# Patient Record
Sex: Female | Born: 1950
Health system: Southern US, Community
[De-identification: ages and names within clinical notes are randomized; demographics above are authoritative.]

## PROBLEM LIST (undated history)

## (undated) DIAGNOSIS — R7303 Prediabetes: Secondary | ICD-10-CM

## (undated) DIAGNOSIS — M1990 Unspecified osteoarthritis, unspecified site: Secondary | ICD-10-CM

## (undated) DIAGNOSIS — J45909 Unspecified asthma, uncomplicated: Secondary | ICD-10-CM

## (undated) DIAGNOSIS — F32A Depression, unspecified: Secondary | ICD-10-CM

## (undated) DIAGNOSIS — T7840XA Allergy, unspecified, initial encounter: Secondary | ICD-10-CM

## (undated) DIAGNOSIS — K5900 Constipation, unspecified: Secondary | ICD-10-CM

## (undated) DIAGNOSIS — G4730 Sleep apnea, unspecified: Secondary | ICD-10-CM

## (undated) DIAGNOSIS — D649 Anemia, unspecified: Secondary | ICD-10-CM

## (undated) DIAGNOSIS — F419 Anxiety disorder, unspecified: Secondary | ICD-10-CM

## (undated) DIAGNOSIS — E785 Hyperlipidemia, unspecified: Secondary | ICD-10-CM

## (undated) DIAGNOSIS — K219 Gastro-esophageal reflux disease without esophagitis: Secondary | ICD-10-CM

## (undated) DIAGNOSIS — I10 Essential (primary) hypertension: Secondary | ICD-10-CM

## (undated) HISTORY — DX: Gastro-esophageal reflux disease without esophagitis: K21.9

## (undated) HISTORY — DX: Hyperlipidemia, unspecified: E78.5

## (undated) HISTORY — DX: Unspecified osteoarthritis, unspecified site: M19.90

## (undated) HISTORY — DX: Unspecified asthma, uncomplicated: J45.909

## (undated) HISTORY — DX: Depression, unspecified: F32.A

## (undated) HISTORY — PX: OTHER SURGICAL HISTORY: SHX169

## (undated) HISTORY — DX: Anxiety disorder, unspecified: F41.9

## (undated) HISTORY — DX: Prediabetes: R73.03

## (undated) HISTORY — DX: Anemia, unspecified: D64.9

## (undated) HISTORY — DX: Sleep apnea, unspecified: G47.30

## (undated) HISTORY — DX: Essential (primary) hypertension: I10

## (undated) HISTORY — PX: EYE SURGERY: SHX253

## (undated) HISTORY — DX: Constipation, unspecified: K59.00

## (undated) HISTORY — DX: Allergy, unspecified, initial encounter: T78.40XA

---

## 1973-06-02 HISTORY — PX: HEMORRHOID SURGERY: SHX153

## 1997-06-02 HISTORY — PX: MENISCUS REPAIR: SHX5179

## 1997-11-24 ENCOUNTER — Ambulatory Visit (HOSPITAL_BASED_OUTPATIENT_CLINIC_OR_DEPARTMENT_OTHER): Admission: RE | Admit: 1997-11-24 | Discharge: 1997-11-24 | Payer: Self-pay | Admitting: Orthopedic Surgery

## 1999-06-03 HISTORY — PX: ABDOMINAL HYSTERECTOMY: SHX81

## 1999-06-26 ENCOUNTER — Other Ambulatory Visit: Admission: RE | Admit: 1999-06-26 | Discharge: 1999-06-26 | Payer: Self-pay | Admitting: Gynecology

## 1999-07-11 ENCOUNTER — Other Ambulatory Visit: Admission: RE | Admit: 1999-07-11 | Discharge: 1999-07-11 | Payer: Self-pay | Admitting: Gynecology

## 1999-07-11 ENCOUNTER — Encounter (INDEPENDENT_AMBULATORY_CARE_PROVIDER_SITE_OTHER): Payer: Self-pay

## 1999-08-05 ENCOUNTER — Inpatient Hospital Stay (HOSPITAL_COMMUNITY): Admission: RE | Admit: 1999-08-05 | Discharge: 1999-08-07 | Payer: Self-pay | Admitting: Gynecology

## 1999-08-05 ENCOUNTER — Encounter (INDEPENDENT_AMBULATORY_CARE_PROVIDER_SITE_OTHER): Payer: Self-pay

## 2000-09-17 ENCOUNTER — Encounter: Payer: Self-pay | Admitting: Gastroenterology

## 2000-09-17 ENCOUNTER — Ambulatory Visit (HOSPITAL_COMMUNITY): Admission: RE | Admit: 2000-09-17 | Discharge: 2000-09-17 | Payer: Self-pay | Admitting: Gastroenterology

## 2000-09-23 ENCOUNTER — Ambulatory Visit (HOSPITAL_COMMUNITY): Admission: RE | Admit: 2000-09-23 | Discharge: 2000-09-23 | Payer: Self-pay | Admitting: Gastroenterology

## 2000-09-23 ENCOUNTER — Encounter: Payer: Self-pay | Admitting: Gastroenterology

## 2000-09-30 ENCOUNTER — Ambulatory Visit (HOSPITAL_COMMUNITY): Admission: RE | Admit: 2000-09-30 | Discharge: 2000-09-30 | Payer: Self-pay | Admitting: Gastroenterology

## 2000-12-09 ENCOUNTER — Ambulatory Visit (HOSPITAL_COMMUNITY): Admission: RE | Admit: 2000-12-09 | Discharge: 2000-12-09 | Payer: Self-pay | Admitting: Gastroenterology

## 2000-12-09 ENCOUNTER — Encounter (INDEPENDENT_AMBULATORY_CARE_PROVIDER_SITE_OTHER): Payer: Self-pay | Admitting: Specialist

## 2001-03-08 ENCOUNTER — Encounter: Payer: Self-pay | Admitting: Gastroenterology

## 2001-03-08 ENCOUNTER — Ambulatory Visit (HOSPITAL_COMMUNITY): Admission: RE | Admit: 2001-03-08 | Discharge: 2001-03-08 | Payer: Self-pay | Admitting: Gastroenterology

## 2002-01-18 ENCOUNTER — Other Ambulatory Visit: Admission: RE | Admit: 2002-01-18 | Discharge: 2002-01-18 | Payer: Self-pay | Admitting: Gynecology

## 2004-07-31 ENCOUNTER — Ambulatory Visit (HOSPITAL_COMMUNITY): Admission: RE | Admit: 2004-07-31 | Discharge: 2004-07-31 | Payer: Self-pay | Admitting: Gastroenterology

## 2005-06-11 ENCOUNTER — Other Ambulatory Visit: Admission: RE | Admit: 2005-06-11 | Discharge: 2005-06-11 | Payer: Self-pay | Admitting: Gynecology

## 2005-07-02 ENCOUNTER — Encounter: Admission: RE | Admit: 2005-07-02 | Discharge: 2005-07-02 | Payer: Self-pay | Admitting: Gynecology

## 2006-04-14 ENCOUNTER — Encounter: Admission: RE | Admit: 2006-04-14 | Discharge: 2006-04-14 | Payer: Self-pay | Admitting: Gynecology

## 2008-10-16 ENCOUNTER — Encounter: Admission: RE | Admit: 2008-10-16 | Discharge: 2008-10-16 | Payer: Self-pay | Admitting: Family Medicine

## 2008-11-14 ENCOUNTER — Encounter: Admission: RE | Admit: 2008-11-14 | Discharge: 2008-11-14 | Payer: Self-pay | Admitting: Family Medicine

## 2010-10-18 NOTE — Procedures (Signed)
[place]. [facility]  Patient:    Olivia Burns, Olivia Burns [facility]                     MRN: [id number] Proc. Date: 12/09/00 Adm. Date:  [id number] Disc. Date: [id number] Attending:  Charna Elizabeth CC:         [name] Cove, M.D.   Procedure Report  DATE OF BIRTH:  Mar 15, 1951  REFERRING PHYSICIAN:  [name] Cove, M.D.  PROCEDURE PERFORMED:  Colonoscopy with snare polypectomy x 1 and cold biopsies x 2.  ENDOSCOPIST:  Anselmo Rod, M.D.  INSTRUMENT USED:  Olympus video colonoscope.  INDICATIONS FOR PROCEDURE:  Rectal bleeding in a 59 year old African-American female, rule out colonic polyps, masses, hemorrhoids, etc.  PREPROCEDURE PREPARATION:  Informed consent was procured from the patient. The patient was fasted for eight hours prior to the procedure and prepped with a bottle of magnesium citrate and a gallon of NuLytely the night prior to the procedure.  PREPROCEDURE PHYSICAL:  The patient had stable vital signs.  Neck supple. Chest clear to auscultation.  S1, S2 regular.  Abdomen soft with normal abdominal bowel sounds.  DESCRIPTION OF PROCEDURE:  The patient was placed in the left lateral decubitus position and sedated with 70 mg of Demerol and 7 mg of Versed intravenously.  Once the patient was adequately sedated and maintained on low-flow oxygen and continuous cardiac monitoring, the Olympus video colonoscope was advanced from the rectum to the cecum without difficulty. The patient had a fairly good prep.  A 5 to 6 mm sessile polyp was snared from the midright colon.  The appendicular orifice and ileocecal valve were clearly visualized and photographed.  They appeared normal.  The transverse colon appeared healthy and normal.  There was no evidence of diverticulosis in the left colon.  There was patchy erythema in the midleft colon which was biopsied for pathology.  Small nonbleeding external hemorrhoids were appreciated on anal inspection. There  was no evidence of internal hemorrhoids.  The patient tolerated the procedure well without complications.  IMPRESSION: 1. Small nonbleeding external hemorrhoids. 2. Patchy erythema biopsied from the midleft colon. 3. Small sessile polyp snared from midright colon. 4. No large masses seen. 5. No evidence of diverticulosis.  RECOMMENDATIONS: 1. Await pathology results. 2. Avoid all nonsteroidals. 3. High fiber diet. 4. Outpatient follow-up in the next four weeks.DD:  12/09/00 TD:  12/09/00 Job: [id number]

## 2010-10-18 NOTE — Procedures (Signed)
[place]. [facility]  Patient:    Olivia Burns, Olivia Burns [facility]                     MRN: [id number] Proc. Date: 09/30/00 Adm. Date:  [id number] Attending:  Charna Elizabeth CC:         [name] Cove, M.D.   Procedure Report  DATE OF BIRTH:  June 17, 1950.  PROCEDURE:  Esophagogastroduodenoscopy.  ENDOSCOPIST:  Anselmo Rod, M.D.  INSTRUMENT USED:  Olympus video panendoscope.  INDICATION FOR PROCEDURE:  Epigastric pain in a 60 year old African-American female.  Rule out peptic ulcer disease, esophagitis, gastritis, etc.  Patient has had a normal gallbladder ultrasound and HIDA scan.  She has some cysts on the liver, confirmed by an MRI in the recent past.  Rule out peptic ulcer disease.  PREPROCEDURE PREPARATION:  Informed consent was procured from the patient. The patient was fasted for eight hours prior to the procedure.  PREPROCEDURE PHYSICAL:  VITAL SIGNS:  The patient had stable vital signs.  NECK:  Supple.  CHEST:  Clear to auscultation.  S1, S2 regular.  ABDOMEN:  Soft with epigastric tenderness on palpation, with no guarding, rebound, or rigidity, no hepatosplenomegaly.  DESCRIPTION OF PROCEDURE:  The patient was placed in the left lateral decubitus position and sedated with 50 mg of Demerol and 7 mg of Versed intravenously.  Once the patient was adequately sedate and maintained on low-flow oxygen and continuous cardiac monitoring, the Olympus video panendoscope was advanced through the mouthpiece, over the tongue, into the esophagus under direct vision.  The entire gastric mucosa and the proximal small bowel appeared normal.  The esophagus showed no evidence of erosions, ulcerations, masses, or polyps.  IMPRESSION:  Normal EGD.  RECOMMENDATIONS: 1. Continue PPIs for now. 2. Colonoscopy will be scheduled for her next follow-up in two weeks. 3. Avoid all nonsteroidals, including aspirin, and follow antireflux measures. DD:  09/30/00 TD:   10/01/00 Job: [id number]

## 2011-06-24 ENCOUNTER — Other Ambulatory Visit: Payer: Self-pay | Admitting: Gastroenterology

## 2011-06-24 DIAGNOSIS — R1011 Right upper quadrant pain: Secondary | ICD-10-CM

## 2011-07-14 ENCOUNTER — Encounter (HOSPITAL_COMMUNITY)
Admission: RE | Admit: 2011-07-14 | Discharge: 2011-07-14 | Disposition: A | Discharge status: Home / Self Care | Payer: 59 | Source: Ambulatory Visit | Attending: Gastroenterology | Admitting: Gastroenterology

## 2011-07-14 DIAGNOSIS — K7689 Other specified diseases of liver: Secondary | ICD-10-CM | POA: Insufficient documentation

## 2011-07-14 DIAGNOSIS — R1011 Right upper quadrant pain: Secondary | ICD-10-CM

## 2011-07-14 MED ORDER — SINCALIDE 5 MCG IJ SOLR: Freq: Once | INTRAMUSCULAR | Status: DC

## 2011-07-14 MED ORDER — TECHNETIUM TC 99M MEBROFENIN IV KIT
5.3000 | PACK | Freq: Once | INTRAVENOUS | Status: AC | PRN
  Administered 2011-07-14: 5 via INTRAVENOUS

## 2011-07-16 ENCOUNTER — Other Ambulatory Visit: Payer: Self-pay | Admitting: Family Medicine

## 2011-07-16 ENCOUNTER — Other Ambulatory Visit (HOSPITAL_COMMUNITY)
Admission: RE | Admit: 2011-07-16 | Discharge: 2011-07-16 | Disposition: A | Discharge status: Home / Self Care | Payer: Self-pay | Source: Ambulatory Visit | Attending: Family Medicine | Admitting: Family Medicine

## 2011-07-16 DIAGNOSIS — Z01419 Encounter for gynecological examination (general) (routine) without abnormal findings: Secondary | ICD-10-CM | POA: Insufficient documentation

## 2011-07-16 DIAGNOSIS — Z1159 Encounter for screening for other viral diseases: Secondary | ICD-10-CM | POA: Insufficient documentation

## 2016-04-14 DIAGNOSIS — E089 Diabetes mellitus due to underlying condition without complications: Secondary | ICD-10-CM | POA: Diagnosis not present

## 2016-04-14 DIAGNOSIS — F4321 Adjustment disorder with depressed mood: Secondary | ICD-10-CM | POA: Diagnosis not present

## 2016-04-14 DIAGNOSIS — I10 Essential (primary) hypertension: Secondary | ICD-10-CM | POA: Diagnosis not present

## 2016-04-14 DIAGNOSIS — E669 Obesity, unspecified: Secondary | ICD-10-CM | POA: Diagnosis not present

## 2016-04-29 DIAGNOSIS — K219 Gastro-esophageal reflux disease without esophagitis: Secondary | ICD-10-CM | POA: Diagnosis not present

## 2016-04-29 DIAGNOSIS — K602 Anal fissure, unspecified: Secondary | ICD-10-CM | POA: Diagnosis not present

## 2016-04-29 DIAGNOSIS — Z86010 Personal history of colonic polyps: Secondary | ICD-10-CM | POA: Diagnosis not present

## 2016-04-29 DIAGNOSIS — K5904 Chronic idiopathic constipation: Secondary | ICD-10-CM | POA: Diagnosis not present

## 2016-04-29 DIAGNOSIS — K625 Hemorrhage of anus and rectum: Secondary | ICD-10-CM | POA: Diagnosis not present

## 2016-05-13 DIAGNOSIS — K602 Anal fissure, unspecified: Secondary | ICD-10-CM | POA: Diagnosis not present

## 2016-05-13 DIAGNOSIS — K5909 Other constipation: Secondary | ICD-10-CM | POA: Diagnosis not present

## 2016-06-20 DIAGNOSIS — Z1231 Encounter for screening mammogram for malignant neoplasm of breast: Secondary | ICD-10-CM | POA: Diagnosis not present

## 2016-06-20 DIAGNOSIS — Z803 Family history of malignant neoplasm of breast: Secondary | ICD-10-CM | POA: Diagnosis not present

## 2016-08-06 DIAGNOSIS — E089 Diabetes mellitus due to underlying condition without complications: Secondary | ICD-10-CM | POA: Diagnosis not present

## 2016-08-06 DIAGNOSIS — E669 Obesity, unspecified: Secondary | ICD-10-CM | POA: Diagnosis not present

## 2016-08-06 DIAGNOSIS — I10 Essential (primary) hypertension: Secondary | ICD-10-CM | POA: Diagnosis not present

## 2016-08-06 DIAGNOSIS — F4321 Adjustment disorder with depressed mood: Secondary | ICD-10-CM | POA: Diagnosis not present

## 2016-08-14 ENCOUNTER — Other Ambulatory Visit: Payer: Self-pay | Admitting: Family Medicine

## 2016-08-14 ENCOUNTER — Ambulatory Visit
Admission: RE | Admit: 2016-08-14 | Discharge: 2016-08-14 | Disposition: A | Discharge status: Home / Self Care | Payer: 59 | Source: Ambulatory Visit | Attending: Family Medicine | Admitting: Family Medicine

## 2016-08-14 DIAGNOSIS — M75102 Unspecified rotator cuff tear or rupture of left shoulder, not specified as traumatic: Secondary | ICD-10-CM | POA: Diagnosis not present

## 2016-08-14 DIAGNOSIS — I10 Essential (primary) hypertension: Secondary | ICD-10-CM | POA: Diagnosis not present

## 2016-08-14 DIAGNOSIS — E089 Diabetes mellitus due to underlying condition without complications: Secondary | ICD-10-CM | POA: Diagnosis not present

## 2016-08-14 DIAGNOSIS — E669 Obesity, unspecified: Secondary | ICD-10-CM | POA: Diagnosis not present

## 2016-08-14 DIAGNOSIS — M25512 Pain in left shoulder: Secondary | ICD-10-CM

## 2016-08-22 DIAGNOSIS — M25512 Pain in left shoulder: Secondary | ICD-10-CM | POA: Diagnosis not present

## 2016-08-22 DIAGNOSIS — M6281 Muscle weakness (generalized): Secondary | ICD-10-CM | POA: Diagnosis not present

## 2016-09-02 DIAGNOSIS — M6281 Muscle weakness (generalized): Secondary | ICD-10-CM | POA: Diagnosis not present

## 2016-09-02 DIAGNOSIS — M25512 Pain in left shoulder: Secondary | ICD-10-CM | POA: Diagnosis not present

## 2016-09-05 DIAGNOSIS — M25512 Pain in left shoulder: Secondary | ICD-10-CM | POA: Diagnosis not present

## 2016-09-05 DIAGNOSIS — M6281 Muscle weakness (generalized): Secondary | ICD-10-CM | POA: Diagnosis not present

## 2016-09-09 DIAGNOSIS — M25512 Pain in left shoulder: Secondary | ICD-10-CM | POA: Diagnosis not present

## 2016-09-09 DIAGNOSIS — M6281 Muscle weakness (generalized): Secondary | ICD-10-CM | POA: Diagnosis not present

## 2016-09-11 DIAGNOSIS — M6281 Muscle weakness (generalized): Secondary | ICD-10-CM | POA: Diagnosis not present

## 2016-09-11 DIAGNOSIS — M25512 Pain in left shoulder: Secondary | ICD-10-CM | POA: Diagnosis not present

## 2016-09-16 DIAGNOSIS — M25512 Pain in left shoulder: Secondary | ICD-10-CM | POA: Diagnosis not present

## 2016-09-16 DIAGNOSIS — M6281 Muscle weakness (generalized): Secondary | ICD-10-CM | POA: Diagnosis not present

## 2016-09-19 DIAGNOSIS — M25512 Pain in left shoulder: Secondary | ICD-10-CM | POA: Diagnosis not present

## 2016-09-19 DIAGNOSIS — M6281 Muscle weakness (generalized): Secondary | ICD-10-CM | POA: Diagnosis not present

## 2016-09-30 DIAGNOSIS — M25512 Pain in left shoulder: Secondary | ICD-10-CM | POA: Diagnosis not present

## 2016-09-30 DIAGNOSIS — M6281 Muscle weakness (generalized): Secondary | ICD-10-CM | POA: Diagnosis not present

## 2016-10-01 DIAGNOSIS — E669 Obesity, unspecified: Secondary | ICD-10-CM | POA: Diagnosis not present

## 2016-10-01 DIAGNOSIS — I10 Essential (primary) hypertension: Secondary | ICD-10-CM | POA: Diagnosis not present

## 2016-10-01 DIAGNOSIS — M75102 Unspecified rotator cuff tear or rupture of left shoulder, not specified as traumatic: Secondary | ICD-10-CM | POA: Diagnosis not present

## 2016-10-01 DIAGNOSIS — E118 Type 2 diabetes mellitus with unspecified complications: Secondary | ICD-10-CM | POA: Diagnosis not present

## 2016-10-03 DIAGNOSIS — M6281 Muscle weakness (generalized): Secondary | ICD-10-CM | POA: Diagnosis not present

## 2016-10-03 DIAGNOSIS — M25512 Pain in left shoulder: Secondary | ICD-10-CM | POA: Diagnosis not present

## 2016-10-07 DIAGNOSIS — M6281 Muscle weakness (generalized): Secondary | ICD-10-CM | POA: Diagnosis not present

## 2016-10-07 DIAGNOSIS — M25512 Pain in left shoulder: Secondary | ICD-10-CM | POA: Diagnosis not present

## 2016-10-10 DIAGNOSIS — M25512 Pain in left shoulder: Secondary | ICD-10-CM | POA: Diagnosis not present

## 2016-10-10 DIAGNOSIS — M6281 Muscle weakness (generalized): Secondary | ICD-10-CM | POA: Diagnosis not present

## 2016-10-14 DIAGNOSIS — M25512 Pain in left shoulder: Secondary | ICD-10-CM | POA: Diagnosis not present

## 2016-10-14 DIAGNOSIS — M6281 Muscle weakness (generalized): Secondary | ICD-10-CM | POA: Diagnosis not present

## 2016-10-22 DIAGNOSIS — M25512 Pain in left shoulder: Secondary | ICD-10-CM | POA: Diagnosis not present

## 2016-10-22 DIAGNOSIS — M6281 Muscle weakness (generalized): Secondary | ICD-10-CM | POA: Diagnosis not present

## 2016-10-24 DIAGNOSIS — M25512 Pain in left shoulder: Secondary | ICD-10-CM | POA: Diagnosis not present

## 2016-10-24 DIAGNOSIS — M6281 Muscle weakness (generalized): Secondary | ICD-10-CM | POA: Diagnosis not present

## 2016-10-28 DIAGNOSIS — E669 Obesity, unspecified: Secondary | ICD-10-CM | POA: Diagnosis not present

## 2016-10-28 DIAGNOSIS — M6281 Muscle weakness (generalized): Secondary | ICD-10-CM | POA: Diagnosis not present

## 2016-10-28 DIAGNOSIS — E089 Diabetes mellitus due to underlying condition without complications: Secondary | ICD-10-CM | POA: Diagnosis not present

## 2016-10-28 DIAGNOSIS — J4520 Mild intermittent asthma, uncomplicated: Secondary | ICD-10-CM | POA: Diagnosis not present

## 2016-10-28 DIAGNOSIS — J9801 Acute bronchospasm: Secondary | ICD-10-CM | POA: Diagnosis not present

## 2016-10-28 DIAGNOSIS — M25512 Pain in left shoulder: Secondary | ICD-10-CM | POA: Diagnosis not present

## 2016-11-04 DIAGNOSIS — M25512 Pain in left shoulder: Secondary | ICD-10-CM | POA: Diagnosis not present

## 2016-11-04 DIAGNOSIS — M6281 Muscle weakness (generalized): Secondary | ICD-10-CM | POA: Diagnosis not present

## 2016-11-07 DIAGNOSIS — M25512 Pain in left shoulder: Secondary | ICD-10-CM | POA: Diagnosis not present

## 2016-11-07 DIAGNOSIS — M6281 Muscle weakness (generalized): Secondary | ICD-10-CM | POA: Diagnosis not present

## 2016-11-10 DIAGNOSIS — M25512 Pain in left shoulder: Secondary | ICD-10-CM | POA: Diagnosis not present

## 2016-11-10 DIAGNOSIS — M6281 Muscle weakness (generalized): Secondary | ICD-10-CM | POA: Diagnosis not present

## 2016-11-12 DIAGNOSIS — J9801 Acute bronchospasm: Secondary | ICD-10-CM | POA: Diagnosis not present

## 2016-11-12 DIAGNOSIS — E0800 Diabetes mellitus due to underlying condition with hyperosmolarity without nonketotic hyperglycemic-hyperosmolar coma (NKHHC): Secondary | ICD-10-CM | POA: Diagnosis not present

## 2016-11-12 DIAGNOSIS — E669 Obesity, unspecified: Secondary | ICD-10-CM | POA: Diagnosis not present

## 2016-11-12 DIAGNOSIS — F4320 Adjustment disorder, unspecified: Secondary | ICD-10-CM | POA: Diagnosis not present

## 2016-11-14 DIAGNOSIS — M6281 Muscle weakness (generalized): Secondary | ICD-10-CM | POA: Diagnosis not present

## 2016-11-14 DIAGNOSIS — M25512 Pain in left shoulder: Secondary | ICD-10-CM | POA: Diagnosis not present

## 2016-11-18 DIAGNOSIS — M25512 Pain in left shoulder: Secondary | ICD-10-CM | POA: Diagnosis not present

## 2016-11-18 DIAGNOSIS — M6281 Muscle weakness (generalized): Secondary | ICD-10-CM | POA: Diagnosis not present

## 2016-11-21 DIAGNOSIS — M25512 Pain in left shoulder: Secondary | ICD-10-CM | POA: Diagnosis not present

## 2016-11-21 DIAGNOSIS — M6281 Muscle weakness (generalized): Secondary | ICD-10-CM | POA: Diagnosis not present

## 2016-11-25 DIAGNOSIS — M6281 Muscle weakness (generalized): Secondary | ICD-10-CM | POA: Diagnosis not present

## 2016-11-25 DIAGNOSIS — M25512 Pain in left shoulder: Secondary | ICD-10-CM | POA: Diagnosis not present

## 2016-11-28 DIAGNOSIS — M6281 Muscle weakness (generalized): Secondary | ICD-10-CM | POA: Diagnosis not present

## 2016-11-28 DIAGNOSIS — M25512 Pain in left shoulder: Secondary | ICD-10-CM | POA: Diagnosis not present

## 2016-12-11 DIAGNOSIS — M6281 Muscle weakness (generalized): Secondary | ICD-10-CM | POA: Diagnosis not present

## 2016-12-11 DIAGNOSIS — M25512 Pain in left shoulder: Secondary | ICD-10-CM | POA: Diagnosis not present

## 2016-12-17 DIAGNOSIS — E089 Diabetes mellitus due to underlying condition without complications: Secondary | ICD-10-CM | POA: Diagnosis not present

## 2016-12-17 DIAGNOSIS — E6609 Other obesity due to excess calories: Secondary | ICD-10-CM | POA: Diagnosis not present

## 2016-12-17 DIAGNOSIS — M75102 Unspecified rotator cuff tear or rupture of left shoulder, not specified as traumatic: Secondary | ICD-10-CM | POA: Diagnosis not present

## 2017-01-08 DIAGNOSIS — M25512 Pain in left shoulder: Secondary | ICD-10-CM | POA: Diagnosis not present

## 2017-01-08 DIAGNOSIS — M6281 Muscle weakness (generalized): Secondary | ICD-10-CM | POA: Diagnosis not present

## 2017-01-22 DIAGNOSIS — Z0000 Encounter for general adult medical examination without abnormal findings: Secondary | ICD-10-CM | POA: Diagnosis not present

## 2017-01-22 DIAGNOSIS — E118 Type 2 diabetes mellitus with unspecified complications: Secondary | ICD-10-CM | POA: Diagnosis not present

## 2017-01-22 DIAGNOSIS — I10 Essential (primary) hypertension: Secondary | ICD-10-CM | POA: Diagnosis not present

## 2017-01-22 DIAGNOSIS — E782 Mixed hyperlipidemia: Secondary | ICD-10-CM | POA: Diagnosis not present

## 2017-01-29 DIAGNOSIS — E6609 Other obesity due to excess calories: Secondary | ICD-10-CM | POA: Diagnosis not present

## 2017-01-29 DIAGNOSIS — R7303 Prediabetes: Secondary | ICD-10-CM | POA: Diagnosis not present

## 2017-01-29 DIAGNOSIS — M7502 Adhesive capsulitis of left shoulder: Secondary | ICD-10-CM | POA: Diagnosis not present

## 2017-03-16 DIAGNOSIS — Z23 Encounter for immunization: Secondary | ICD-10-CM | POA: Diagnosis not present

## 2017-04-16 DIAGNOSIS — R110 Nausea: Secondary | ICD-10-CM | POA: Diagnosis not present

## 2017-04-16 DIAGNOSIS — K219 Gastro-esophageal reflux disease without esophagitis: Secondary | ICD-10-CM | POA: Diagnosis not present

## 2017-04-16 DIAGNOSIS — K5904 Chronic idiopathic constipation: Secondary | ICD-10-CM | POA: Diagnosis not present

## 2017-04-16 DIAGNOSIS — Z86010 Personal history of colonic polyps: Secondary | ICD-10-CM | POA: Diagnosis not present

## 2017-04-16 DIAGNOSIS — R1011 Right upper quadrant pain: Secondary | ICD-10-CM | POA: Diagnosis not present

## 2017-04-17 ENCOUNTER — Other Ambulatory Visit: Payer: Self-pay | Admitting: Gastroenterology

## 2017-04-17 DIAGNOSIS — R1011 Right upper quadrant pain: Secondary | ICD-10-CM

## 2017-04-17 DIAGNOSIS — R112 Nausea with vomiting, unspecified: Secondary | ICD-10-CM

## 2017-04-29 ENCOUNTER — Ambulatory Visit (HOSPITAL_COMMUNITY): Payer: Medicare Other

## 2017-04-29 ENCOUNTER — Ambulatory Visit (HOSPITAL_COMMUNITY)
Admission: RE | Admit: 2017-04-29 | Discharge: 2017-04-29 | Disposition: A | Discharge status: Home / Self Care | Payer: Medicare Other | Source: Ambulatory Visit | Attending: Gastroenterology | Admitting: Gastroenterology

## 2017-04-29 DIAGNOSIS — R110 Nausea: Secondary | ICD-10-CM | POA: Diagnosis not present

## 2017-04-29 DIAGNOSIS — K824 Cholesterolosis of gallbladder: Secondary | ICD-10-CM | POA: Diagnosis not present

## 2017-04-29 DIAGNOSIS — R112 Nausea with vomiting, unspecified: Secondary | ICD-10-CM

## 2017-04-29 DIAGNOSIS — D1803 Hemangioma of intra-abdominal structures: Secondary | ICD-10-CM | POA: Insufficient documentation

## 2017-04-29 DIAGNOSIS — R1011 Right upper quadrant pain: Secondary | ICD-10-CM | POA: Diagnosis not present

## 2017-05-04 ENCOUNTER — Encounter (HOSPITAL_COMMUNITY): Payer: Self-pay | Admitting: Radiology

## 2017-05-04 ENCOUNTER — Ambulatory Visit (HOSPITAL_COMMUNITY)
Admission: RE | Admit: 2017-05-04 | Discharge: 2017-05-04 | Disposition: A | Discharge status: Home / Self Care | Payer: Medicare Other | Source: Ambulatory Visit | Attending: Gastroenterology | Admitting: Gastroenterology

## 2017-05-04 DIAGNOSIS — R112 Nausea with vomiting, unspecified: Secondary | ICD-10-CM | POA: Diagnosis not present

## 2017-05-04 DIAGNOSIS — R110 Nausea: Secondary | ICD-10-CM | POA: Diagnosis not present

## 2017-05-04 DIAGNOSIS — R1011 Right upper quadrant pain: Secondary | ICD-10-CM | POA: Diagnosis not present

## 2017-05-04 MED ORDER — TECHNETIUM TC 99M MEBROFENIN IV KIT
5.0000 | PACK | Freq: Once | INTRAVENOUS | Status: AC | PRN
  Administered 2017-05-04: 5 via INTRAVENOUS

## 2017-05-05 DIAGNOSIS — K5904 Chronic idiopathic constipation: Secondary | ICD-10-CM | POA: Diagnosis not present

## 2017-05-05 DIAGNOSIS — R1310 Dysphagia, unspecified: Secondary | ICD-10-CM | POA: Diagnosis not present

## 2017-05-05 DIAGNOSIS — K625 Hemorrhage of anus and rectum: Secondary | ICD-10-CM | POA: Diagnosis not present

## 2017-05-05 DIAGNOSIS — K219 Gastro-esophageal reflux disease without esophagitis: Secondary | ICD-10-CM | POA: Diagnosis not present

## 2017-05-05 DIAGNOSIS — Z86010 Personal history of colonic polyps: Secondary | ICD-10-CM | POA: Diagnosis not present

## 2017-06-01 DIAGNOSIS — K228 Other specified diseases of esophagus: Secondary | ICD-10-CM | POA: Diagnosis not present

## 2017-06-01 DIAGNOSIS — K219 Gastro-esophageal reflux disease without esophagitis: Secondary | ICD-10-CM | POA: Diagnosis not present

## 2017-06-01 DIAGNOSIS — K449 Diaphragmatic hernia without obstruction or gangrene: Secondary | ICD-10-CM | POA: Diagnosis not present

## 2017-06-01 DIAGNOSIS — K200 Eosinophilic esophagitis: Secondary | ICD-10-CM | POA: Diagnosis not present

## 2017-06-01 DIAGNOSIS — R1310 Dysphagia, unspecified: Secondary | ICD-10-CM | POA: Diagnosis not present

## 2017-06-03 DIAGNOSIS — I10 Essential (primary) hypertension: Secondary | ICD-10-CM | POA: Diagnosis not present

## 2017-06-03 DIAGNOSIS — E669 Obesity, unspecified: Secondary | ICD-10-CM | POA: Diagnosis not present

## 2017-06-03 DIAGNOSIS — E118 Type 2 diabetes mellitus with unspecified complications: Secondary | ICD-10-CM | POA: Diagnosis not present

## 2017-06-16 DIAGNOSIS — Z86010 Personal history of colonic polyps: Secondary | ICD-10-CM | POA: Diagnosis not present

## 2017-06-16 DIAGNOSIS — K5904 Chronic idiopathic constipation: Secondary | ICD-10-CM | POA: Diagnosis not present

## 2017-06-16 DIAGNOSIS — K219 Gastro-esophageal reflux disease without esophagitis: Secondary | ICD-10-CM | POA: Diagnosis not present

## 2017-06-22 DIAGNOSIS — Z1231 Encounter for screening mammogram for malignant neoplasm of breast: Secondary | ICD-10-CM | POA: Diagnosis not present

## 2017-09-29 DIAGNOSIS — E118 Type 2 diabetes mellitus with unspecified complications: Secondary | ICD-10-CM | POA: Diagnosis not present

## 2017-09-29 DIAGNOSIS — E089 Diabetes mellitus due to underlying condition without complications: Secondary | ICD-10-CM | POA: Diagnosis not present

## 2017-09-29 DIAGNOSIS — F4321 Adjustment disorder with depressed mood: Secondary | ICD-10-CM | POA: Diagnosis not present

## 2017-09-29 DIAGNOSIS — E782 Mixed hyperlipidemia: Secondary | ICD-10-CM | POA: Diagnosis not present

## 2017-09-29 DIAGNOSIS — E669 Obesity, unspecified: Secondary | ICD-10-CM | POA: Diagnosis not present

## 2017-09-29 DIAGNOSIS — I10 Essential (primary) hypertension: Secondary | ICD-10-CM | POA: Diagnosis not present

## 2017-10-01 DIAGNOSIS — E0800 Diabetes mellitus due to underlying condition with hyperosmolarity without nonketotic hyperglycemic-hyperosmolar coma (NKHHC): Secondary | ICD-10-CM | POA: Diagnosis not present

## 2017-10-01 DIAGNOSIS — E6609 Other obesity due to excess calories: Secondary | ICD-10-CM | POA: Diagnosis not present

## 2017-10-01 DIAGNOSIS — E118 Type 2 diabetes mellitus with unspecified complications: Secondary | ICD-10-CM | POA: Diagnosis not present

## 2017-10-01 DIAGNOSIS — I10 Essential (primary) hypertension: Secondary | ICD-10-CM | POA: Diagnosis not present

## 2017-11-25 DIAGNOSIS — Z1211 Encounter for screening for malignant neoplasm of colon: Secondary | ICD-10-CM | POA: Diagnosis not present

## 2017-11-25 DIAGNOSIS — K635 Polyp of colon: Secondary | ICD-10-CM | POA: Diagnosis not present

## 2017-11-25 DIAGNOSIS — D123 Benign neoplasm of transverse colon: Secondary | ICD-10-CM | POA: Diagnosis not present

## 2017-12-01 DIAGNOSIS — E118 Type 2 diabetes mellitus with unspecified complications: Secondary | ICD-10-CM | POA: Diagnosis not present

## 2017-12-01 DIAGNOSIS — E782 Mixed hyperlipidemia: Secondary | ICD-10-CM | POA: Diagnosis not present

## 2017-12-01 DIAGNOSIS — E669 Obesity, unspecified: Secondary | ICD-10-CM | POA: Diagnosis not present

## 2017-12-01 DIAGNOSIS — I10 Essential (primary) hypertension: Secondary | ICD-10-CM | POA: Diagnosis not present

## 2017-12-01 DIAGNOSIS — F4329 Adjustment disorder with other symptoms: Secondary | ICD-10-CM | POA: Diagnosis not present

## 2017-12-30 ENCOUNTER — Other Ambulatory Visit: Payer: Self-pay

## 2018-03-30 DIAGNOSIS — F4329 Adjustment disorder with other symptoms: Secondary | ICD-10-CM | POA: Diagnosis not present

## 2018-03-30 DIAGNOSIS — E118 Type 2 diabetes mellitus with unspecified complications: Secondary | ICD-10-CM | POA: Diagnosis not present

## 2018-03-30 DIAGNOSIS — I10 Essential (primary) hypertension: Secondary | ICD-10-CM | POA: Diagnosis not present

## 2018-03-30 DIAGNOSIS — E782 Mixed hyperlipidemia: Secondary | ICD-10-CM | POA: Diagnosis not present

## 2018-03-30 DIAGNOSIS — E669 Obesity, unspecified: Secondary | ICD-10-CM | POA: Diagnosis not present

## 2018-03-30 DIAGNOSIS — E0800 Diabetes mellitus due to underlying condition with hyperosmolarity without nonketotic hyperglycemic-hyperosmolar coma (NKHHC): Secondary | ICD-10-CM | POA: Diagnosis not present

## 2018-04-01 DIAGNOSIS — Z23 Encounter for immunization: Secondary | ICD-10-CM | POA: Diagnosis not present

## 2018-04-01 DIAGNOSIS — Z6835 Body mass index (BMI) 35.0-35.9, adult: Secondary | ICD-10-CM | POA: Diagnosis not present

## 2018-04-01 DIAGNOSIS — I10 Essential (primary) hypertension: Secondary | ICD-10-CM | POA: Diagnosis not present

## 2018-04-01 DIAGNOSIS — E118 Type 2 diabetes mellitus with unspecified complications: Secondary | ICD-10-CM | POA: Diagnosis not present

## 2018-04-01 DIAGNOSIS — E782 Mixed hyperlipidemia: Secondary | ICD-10-CM | POA: Diagnosis not present

## 2018-06-28 DIAGNOSIS — Z1231 Encounter for screening mammogram for malignant neoplasm of breast: Secondary | ICD-10-CM | POA: Diagnosis not present

## 2018-06-28 DIAGNOSIS — Z803 Family history of malignant neoplasm of breast: Secondary | ICD-10-CM | POA: Diagnosis not present

## 2018-09-22 DIAGNOSIS — E782 Mixed hyperlipidemia: Secondary | ICD-10-CM | POA: Diagnosis not present

## 2018-09-22 DIAGNOSIS — E6609 Other obesity due to excess calories: Secondary | ICD-10-CM | POA: Diagnosis not present

## 2018-09-22 DIAGNOSIS — E118 Type 2 diabetes mellitus with unspecified complications: Secondary | ICD-10-CM | POA: Diagnosis not present

## 2018-09-22 DIAGNOSIS — I10 Essential (primary) hypertension: Secondary | ICD-10-CM | POA: Diagnosis not present

## 2018-09-28 DIAGNOSIS — I10 Essential (primary) hypertension: Secondary | ICD-10-CM | POA: Diagnosis not present

## 2018-09-28 DIAGNOSIS — E669 Obesity, unspecified: Secondary | ICD-10-CM | POA: Diagnosis not present

## 2018-09-28 DIAGNOSIS — E1169 Type 2 diabetes mellitus with other specified complication: Secondary | ICD-10-CM | POA: Diagnosis not present

## 2018-10-28 DIAGNOSIS — I10 Essential (primary) hypertension: Secondary | ICD-10-CM | POA: Diagnosis not present

## 2018-10-28 DIAGNOSIS — F4320 Adjustment disorder, unspecified: Secondary | ICD-10-CM | POA: Diagnosis not present

## 2018-10-28 DIAGNOSIS — E669 Obesity, unspecified: Secondary | ICD-10-CM | POA: Diagnosis not present

## 2018-10-28 DIAGNOSIS — E1169 Type 2 diabetes mellitus with other specified complication: Secondary | ICD-10-CM | POA: Diagnosis not present

## 2019-01-19 DIAGNOSIS — Z0000 Encounter for general adult medical examination without abnormal findings: Secondary | ICD-10-CM | POA: Diagnosis not present

## 2019-02-24 DIAGNOSIS — E6609 Other obesity due to excess calories: Secondary | ICD-10-CM | POA: Diagnosis not present

## 2019-02-24 DIAGNOSIS — E1169 Type 2 diabetes mellitus with other specified complication: Secondary | ICD-10-CM | POA: Diagnosis not present

## 2019-02-24 DIAGNOSIS — I10 Essential (primary) hypertension: Secondary | ICD-10-CM | POA: Diagnosis not present

## 2019-02-24 DIAGNOSIS — E782 Mixed hyperlipidemia: Secondary | ICD-10-CM | POA: Diagnosis not present

## 2019-02-24 DIAGNOSIS — F4329 Adjustment disorder with other symptoms: Secondary | ICD-10-CM | POA: Diagnosis not present

## 2019-03-01 DIAGNOSIS — E1169 Type 2 diabetes mellitus with other specified complication: Secondary | ICD-10-CM | POA: Diagnosis not present

## 2019-03-01 DIAGNOSIS — I10 Essential (primary) hypertension: Secondary | ICD-10-CM | POA: Diagnosis not present

## 2019-03-01 DIAGNOSIS — E785 Hyperlipidemia, unspecified: Secondary | ICD-10-CM | POA: Diagnosis not present

## 2019-05-06 ENCOUNTER — Other Ambulatory Visit: Payer: Self-pay

## 2019-05-06 DIAGNOSIS — Z20822 Contact with and (suspected) exposure to covid-19: Secondary | ICD-10-CM

## 2019-05-07 LAB — NOVEL CORONAVIRUS, NAA: SARS-CoV-2, NAA: NOT DETECTED

## 2019-06-29 DIAGNOSIS — I10 Essential (primary) hypertension: Secondary | ICD-10-CM | POA: Diagnosis not present

## 2019-06-29 DIAGNOSIS — E669 Obesity, unspecified: Secondary | ICD-10-CM | POA: Diagnosis not present

## 2019-06-29 DIAGNOSIS — E1169 Type 2 diabetes mellitus with other specified complication: Secondary | ICD-10-CM | POA: Diagnosis not present

## 2019-06-29 DIAGNOSIS — E785 Hyperlipidemia, unspecified: Secondary | ICD-10-CM | POA: Diagnosis not present

## 2019-07-01 DIAGNOSIS — I10 Essential (primary) hypertension: Secondary | ICD-10-CM | POA: Diagnosis not present

## 2019-07-01 DIAGNOSIS — E1169 Type 2 diabetes mellitus with other specified complication: Secondary | ICD-10-CM | POA: Diagnosis not present

## 2019-07-01 DIAGNOSIS — E785 Hyperlipidemia, unspecified: Secondary | ICD-10-CM | POA: Diagnosis not present

## 2019-07-11 DIAGNOSIS — Z1231 Encounter for screening mammogram for malignant neoplasm of breast: Secondary | ICD-10-CM | POA: Diagnosis not present

## 2019-08-29 DIAGNOSIS — E1169 Type 2 diabetes mellitus with other specified complication: Secondary | ICD-10-CM | POA: Diagnosis not present

## 2019-08-29 DIAGNOSIS — I10 Essential (primary) hypertension: Secondary | ICD-10-CM | POA: Diagnosis not present

## 2019-09-01 IMAGING — NM NM HEPATO W/GB/PHARM/[PERSON_NAME]
2 series · 12 of 12 positions shown · non-contrast
Comparison: None

CLINICAL DATA: RIGHT upper quadrant pain, nausea without vomiting,
onset of symptoms 1 month ago

EXAM:
NUCLEAR MEDICINE HEPATOBILIARY IMAGING WITH GALLBLADDER EF
TECHNIQUE: Sequential images of the abdomen were obtained [DATE] minutes
following intravenous administration of radiopharmaceutical. After
oral ingestion of Ensure, gallbladder ejection fraction was
determined. At 60 min, normal ejection fraction is greater than 33%.
RADIOPHARMACEUTICALS:  5.3 E mCi 7c-QQm  Choletec IV

[he hepatobiliary · 3.10mm/px · 6 of 60 frames shown (1 of 2)]
[frame 6/60]
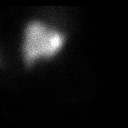
[frame 16/60]
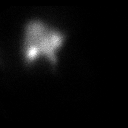
[frame 26/60]
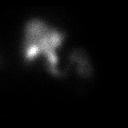
[frame 36/60]
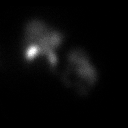
[frame 46/60]
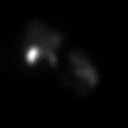
[frame 56/60]
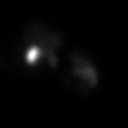

[he hepatobiliary · 3.10mm/px · 6 of 60 frames shown (2 of 2)]
[frame 6/60]
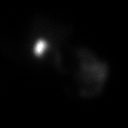
[frame 16/60]
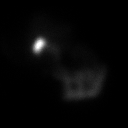
[frame 26/60]
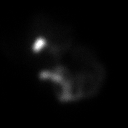
[frame 36/60]
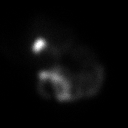
[frame 46/60]
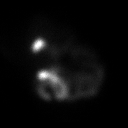
[frame 56/60]
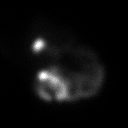

[12 of 12 positions shown; findings below may reference images not displayed]

FINDINGS: Normal tracer extraction from bloodstream indicating normal
hepatocellular function.

Normal excretion of tracer into biliary tree.

Gallbladder visualized at 11 min.

Small bowel visualized at 19 min.

No hepatic retention of tracer.

Subjectively normal emptying of tracer from gallbladder following
fatty meal stimulation.

Calculated gallbladder ejection fraction is 58%, normal.

Patient reported no symptoms following Ensure ingestion.

Normal gallbladder ejection fraction following Ensure ingestion is
greater than 33% at 1 hour.
IMPRESSION: Normal exam.

## 2020-01-30 ENCOUNTER — Other Ambulatory Visit: Payer: Self-pay | Admitting: Family Medicine

## 2020-01-30 ENCOUNTER — Ambulatory Visit
Admission: RE | Admit: 2020-01-30 | Discharge: 2020-01-30 | Disposition: A | Discharge status: Home / Self Care | Payer: Medicare Other | Source: Ambulatory Visit | Attending: Family Medicine | Admitting: Family Medicine

## 2020-01-30 ENCOUNTER — Other Ambulatory Visit: Payer: Self-pay

## 2020-01-30 DIAGNOSIS — E1169 Type 2 diabetes mellitus with other specified complication: Secondary | ICD-10-CM | POA: Diagnosis not present

## 2020-01-30 DIAGNOSIS — M1990 Unspecified osteoarthritis, unspecified site: Secondary | ICD-10-CM

## 2020-01-30 DIAGNOSIS — M13862 Other specified arthritis, left knee: Secondary | ICD-10-CM | POA: Diagnosis not present

## 2020-01-30 DIAGNOSIS — M1711 Unilateral primary osteoarthritis, right knee: Secondary | ICD-10-CM | POA: Diagnosis not present

## 2020-01-30 DIAGNOSIS — M7122 Synovial cyst of popliteal space [Baker], left knee: Secondary | ICD-10-CM | POA: Diagnosis not present

## 2020-01-30 DIAGNOSIS — M25461 Effusion, right knee: Secondary | ICD-10-CM | POA: Diagnosis not present

## 2020-01-30 DIAGNOSIS — M13861 Other specified arthritis, right knee: Secondary | ICD-10-CM | POA: Diagnosis not present

## 2020-01-30 DIAGNOSIS — M1712 Unilateral primary osteoarthritis, left knee: Secondary | ICD-10-CM | POA: Diagnosis not present

## 2020-01-30 DIAGNOSIS — I10 Essential (primary) hypertension: Secondary | ICD-10-CM | POA: Diagnosis not present

## 2020-02-20 DIAGNOSIS — Z23 Encounter for immunization: Secondary | ICD-10-CM | POA: Diagnosis not present

## 2020-03-01 DIAGNOSIS — K219 Gastro-esophageal reflux disease without esophagitis: Secondary | ICD-10-CM | POA: Diagnosis not present

## 2020-03-01 DIAGNOSIS — Z86010 Personal history of colonic polyps: Secondary | ICD-10-CM | POA: Diagnosis not present

## 2020-03-01 DIAGNOSIS — K625 Hemorrhage of anus and rectum: Secondary | ICD-10-CM | POA: Diagnosis not present

## 2020-03-01 DIAGNOSIS — K5904 Chronic idiopathic constipation: Secondary | ICD-10-CM | POA: Diagnosis not present

## 2020-03-06 ENCOUNTER — Telehealth: Payer: Self-pay | Admitting: General Practice

## 2020-03-06 NOTE — Telephone Encounter (Signed)
Could you schedule this as a new patient.

## 2020-03-06 NOTE — Telephone Encounter (Signed)
OK to schedule. Ty.

## 2020-03-06 NOTE — Telephone Encounter (Signed)
Patient states her friend sees you as her PCP. Patient would like to establish care as well .  Please Advise

## 2020-05-02 DIAGNOSIS — H5212 Myopia, left eye: Secondary | ICD-10-CM | POA: Diagnosis not present

## 2020-05-02 DIAGNOSIS — H52223 Regular astigmatism, bilateral: Secondary | ICD-10-CM | POA: Diagnosis not present

## 2020-05-02 DIAGNOSIS — H25813 Combined forms of age-related cataract, bilateral: Secondary | ICD-10-CM | POA: Diagnosis not present

## 2020-05-02 DIAGNOSIS — H5203 Hypermetropia, bilateral: Secondary | ICD-10-CM | POA: Diagnosis not present

## 2020-05-07 ENCOUNTER — Ambulatory Visit (INDEPENDENT_AMBULATORY_CARE_PROVIDER_SITE_OTHER): Payer: Medicare Other | Admitting: Family Medicine

## 2020-05-07 ENCOUNTER — Encounter: Payer: Self-pay | Admitting: Family Medicine

## 2020-05-07 ENCOUNTER — Other Ambulatory Visit: Payer: Self-pay

## 2020-05-07 VITALS — BP 118/68 | HR 71 | Temp 98.3°F | Ht 61.0 in | Wt 221.4 lb

## 2020-05-07 DIAGNOSIS — E785 Hyperlipidemia, unspecified: Secondary | ICD-10-CM

## 2020-05-07 DIAGNOSIS — I10 Essential (primary) hypertension: Secondary | ICD-10-CM | POA: Diagnosis not present

## 2020-05-07 DIAGNOSIS — R7303 Prediabetes: Secondary | ICD-10-CM | POA: Diagnosis not present

## 2020-05-07 LAB — LIPID PANEL
Cholesterol: 214 mg/dL — ABNORMAL HIGH (ref 0–200)
HDL: 55 mg/dL (ref >39.00)
LDL Cholesterol: 141 mg/dL — ABNORMAL HIGH (ref 0–99)
NonHDL: 159.05
Total CHOL/HDL Ratio: 4
Triglycerides: 91 mg/dL (ref 0.0–149.0)
VLDL: 18.2 mg/dL (ref 0.0–40.0)

## 2020-05-07 LAB — COMPREHENSIVE METABOLIC PANEL
ALT: 16 U/L (ref 0–35)
AST: 18 U/L (ref 0–37)
Albumin: 4.1 g/dL (ref 3.5–5.2)
Alkaline Phosphatase: 80 U/L (ref 39–117)
BUN: 9 mg/dL (ref 6–23)
CO2: 33 mEq/L — ABNORMAL HIGH (ref 19–32)
Calcium: 9.2 mg/dL (ref 8.4–10.5)
Chloride: 102 mEq/L (ref 96–112)
Creatinine, Ser: 0.88 mg/dL (ref 0.40–1.20)
GFR: 66.75 mL/min (ref >60.00)
Glucose, Bld: 91 mg/dL (ref 70–99)
Potassium: 3.8 mEq/L (ref 3.5–5.1)
Sodium: 141 mEq/L (ref 135–145)
Total Bilirubin: 0.4 mg/dL (ref 0.2–1.2)
Total Protein: 6.7 g/dL (ref 6.0–8.3)

## 2020-05-07 LAB — HEMOGLOBIN A1C: Hgb A1c MFr Bld: 6.1 % (ref 4.6–6.5)

## 2020-05-07 NOTE — Patient Instructions (Addendum)
Aim to do some physical exertion for 150 minutes per week. This is typically divided into 5 days per week, 30 minutes per day. The activity should be enough to get your heart rate up. Anything is better than nothing if you have time constraints. Consider weight resistance exercise and using YouTube.   Give [place] 2-3 business days to get the results of your labs back.   Try to drink 55-60 oz of water daily outside of exercise.  Keep the diet clean and stay active.  Healthy Eating Plan Many factors influence your heart health, including eating and exercise habits. Heart (coronary) risk increases with abnormal blood fat (lipid) levels. Heart-healthy meal planning includes limiting unhealthy fats, increasing healthy fats, and making other small dietary changes. This includes maintaining a healthy body weight to help keep lipid levels within a normal range.  WHAT IS MY PLAN?  Your health care provider recommends that you:  Drink a glass of water before meals to help with satiety.  Eat slowly.  An alternative to the water is to add Metamucil. This will help with satiety as well. It does contain calories, unlike water.  WHAT TYPES OF FAT SHOULD I CHOOSE?  Choose healthy fats more often. Choose monounsaturated and polyunsaturated fats, such as olive oil and canola oil, flaxseeds, walnuts, almonds, and seeds.  Eat more omega-3 fats. Good choices include salmon, mackerel, sardines, tuna, flaxseed oil, and ground flaxseeds. Aim to eat fish at least two times each week.  Avoid foods with partially hydrogenated oils in them. These contain trans fats. Examples of foods that contain trans fats are stick margarine, some tub margarines, cookies, crackers, and other baked goods. If you are going to avoid a fat, this is the one to avoid!  WHAT GENERAL GUIDELINES DO I NEED TO FOLLOW?  Check food labels carefully to identify foods with trans fats. Avoid these types of options when possible.  Fill one half of  your plate with vegetables and green salads. Eat 4-5 servings of vegetables per day. A serving of vegetables equals 1 cup of raw leafy vegetables,  cup of raw or cooked cut-up vegetables, or  cup of vegetable juice.  Fill one fourth of your plate with whole grains. Look for the word "whole" as the first word in the ingredient list.  Fill one fourth of your plate with lean protein foods.  Eat 4-5 servings of fruit per day. A serving of fruit equals one medium whole fruit,  cup of dried fruit,  cup of fresh, frozen, or canned fruit. Try to avoid fruits in cups/syrups as the sugar content can be high.  Eat more foods that contain soluble fiber. Examples of foods that contain this type of fiber are apples, broccoli, carrots, beans, peas, and barley. Aim to get 20-30 g of fiber per day.  Eat more home-cooked food and less restaurant, buffet, and fast food.  Limit or avoid alcohol.  Limit foods that are high in starch and sugar.  Avoid fried foods when able.  Cook foods by using methods other than frying. Baking, boiling, grilling, and broiling are all great options. Other fat-reducing suggestions include: ? Removing the skin from poultry. ? Removing all visible fats from meats. ? Skimming the fat off of stews, soups, and gravies before serving them. ? Steaming vegetables in water or broth.  Lose weight if you are overweight. Losing just 5-10% of your initial body weight can help your overall health and prevent diseases such as diabetes and heart disease.  Increase your consumption of nuts, legumes, and seeds to 4-5 servings per week. One serving of dried beans or legumes equals  cup after being cooked, one serving of nuts equals 1 ounces, and one serving of seeds equals  ounce or 1 tablespoon.  WHAT ARE GOOD FOODS CAN I EAT? Grains Grainy breads (try to find bread that is 3 g of fiber per slice or greater), oatmeal, light popcorn. Whole-grain cereals. Rice and pasta, including brown  rice and those that are made with whole wheat. Edamame pasta is a great alternative to grain pasta. It has a higher protein content. Try to avoid significant consumption of white bread, sugary cereals, or pastries/baked goods.  Vegetables All vegetables. Cooked white potatoes do not count as vegetables.  Fruits All fruits, but limit pineapple and bananas as these fruits have a higher sugar content.  Meats and Other Protein Sources Lean, well-trimmed beef, veal, pork, and lamb. Chicken and [place] without skin. All fish and shellfish. Wild duck, rabbit, pheasant, and venison. Egg whites or low-cholesterol egg substitutes. Dried beans, peas, lentils, and tofu.Seeds and most nuts.  Dairy Low-fat or nonfat cheeses, including ricotta, string, and mozzarella. Skim or 1% milk that is liquid, powdered, or evaporated. Buttermilk that is made with low-fat milk. Nonfat or low-fat yogurt. Soy/Almond milk are good alternatives if you cannot handle dairy.  Beverages Water is the best for you. Sports drinks with less sugar are more desirable unless you are a highly active athlete.  Sweets and Desserts Sherbets and fruit ices. Honey, jam, marmalade, jelly, and syrups. Dark chocolate.  Eat all sweets and desserts in moderation.  Fats and Oils Nonhydrogenated (trans-free) margarines. Vegetable oils, including soybean, sesame, sunflower, olive, peanut, safflower, corn, canola, and cottonseed. Salad dressings or mayonnaise that are made with a vegetable oil. Limit added fats and oils that you use for cooking, baking, salads, and as spreads.  Other Cocoa powder. Coffee and tea. Most condiments.  The items listed above may not be a complete list of recommended foods or beverages. Contact your dietitian for more options.

## 2020-05-07 NOTE — Progress Notes (Signed)
Chief Complaint  Patient presents with  . New Patient (Initial Visit)       New Patient Visit SUBJECTIVE: HPI: Olivia Burns is an 69 y.o.female who is being seen for establishing care.  The patient was previously seen at [facility].  Hypertension Patient presents for hypertension follow up. She does monitor home blood pressures. Blood pressures ranging on average from 120-130's/70-80's. She is compliant with medications- Diovan-HCT 80-12.5 mg/d. Patient has these side effects of medication: none She is not adhering to a healthy diet overall. Exercise: walking  Hyperlipidemia Patient presents for dyslipidemia follow up. Currently not on a statin 2/2 myalgias.  It sounds like she has been on Crestor and Lipitor.  Pravastatin/Pravachol and lovastatin/Mevacor do not sound familiar to her.  She has never been on Zetia. Diet/exericse as above.  The patient is not known to have coexisting coronary artery disease.  Past Medical History:  Diagnosis Date  . Allergy   . Arthritis   . Asthma   . Depression   . GERD (gastroesophageal reflux disease)   . Hyperlipidemia   . Hypertension    Past Surgical History:  Procedure Laterality Date  . ABDOMINAL HYSTERECTOMY  2001  . [place]  . MENISCUS REPAIR Left 1999  . miscarriage  1977 and 1979   Family History  Problem Relation Age of Onset  . Arthritis Mother   . Asthma Mother   . Depression Mother   . Diabetes Mother   . Heart disease Mother   . Kidney disease Mother   . Arthritis Father   . Depression Father   . Hyperlipidemia Father   . Hypertension Father   . Cancer Father        prostate  . Cancer Sister        gastric vs pancreatic  . Drug abuse Brother   . Early death Brother    Allergies  Allergen Reactions  . Codeine Other (See Comments)    Problems breathing  . Penicillins Itching  . Latex Rash    Current Outpatient Medications:  .  Ascorbic Acid (VITAMIN C-ROSE HIPS-ACEROLA PO), Take 1  capsule by mouth daily., Disp: , Rfl:  .  Calcium Carbonate-Vitamin D (OYSTER SHELL CALCIUM/VITAMIN D) 250-125 MG-UNIT TABS, Take 1 tablet by mouth daily., Disp: , Rfl:  .  diclofenac Sodium (VOLTAREN) 1 % GEL, Apply 2 g topically 4 (four) times daily., Disp: , Rfl:  .  linaclotide (LINZESS) 145 MCG CAPS capsule, Take 145 mcg by mouth daily before breakfast., Disp: , Rfl:  .  MAGNESIUM GLYCINATE PO, Take 400 capsules by mouth daily., Disp: , Rfl:  .  Multiple Vitamins-Minerals (CENTRUM SILVER 50+WOMEN) TABS, Take 1 tablet by mouth daily., Disp: , Rfl:  .  omeprazole (PRILOSEC) 40 MG capsule, Take 40 mg by mouth daily., Disp: , Rfl:  .  QUERCETIN PO, Take 1,000 mg by mouth daily., Disp: , Rfl:  .  TURMERIC PO, Take 1,000 mg by mouth daily., Disp: , Rfl:  .  valsartan-hydrochlorothiazide (DIOVAN-HCT) 80-12.5 MG tablet, Take 1 tablet by mouth daily., Disp: , Rfl:   OBJECTIVE: BP 118/68 (BP Location: Left Arm, Patient Position: Sitting, Cuff Size: Large)   Pulse 71   Temp 98.3 F (36.8 C) (Oral)   Ht 5\' 1"  (1.549 m)   Wt 221 lb 6 oz (100.4 kg)   SpO2 98%   BMI 41.83 kg/m  General:  well developed, well nourished, in no apparent distress Skin:  no significant moles, warts, or  growths Lungs:  clear to auscultation, breath sounds equal bilaterally, no respiratory distress Cardio:  regular rate and rhythm, no LE edema or bruits Neuro:  gait normal Psych: well oriented with normal range of affect and appropriate judgment/insight  ASSESSMENT/PLAN: Essential hypertension - Plan: Comprehensive metabolic panel  Prediabetes - Plan: Hemoglobin A1c  Hyperlipidemia, unspecified hyperlipidemia type - Plan: Lipid panel, Comprehensive metabolic panel  Patient instructed to sign release of records form from their previous PCP. 1.  Counseled on diet and exercise.  Continue Diovan/hydrochlorothiazide 80-12.5 mg daily.  Does not need to monitor blood pressure at home anymore.   2.  Check A1c. 3.  Check  cholesterol.  Will check her records as well, but would prefer to try a low-dose pravastatin or lovastatin.  Encouraged hydration as he only drinks around 32 ounces of water daily. Patient should return pending above results. The patient voiced understanding and agreement to the plan.   [place], DO 05/07/20  12:25 PM

## 2020-07-02 DIAGNOSIS — H5709 Other anomalies of pupillary function: Secondary | ICD-10-CM | POA: Diagnosis not present

## 2020-07-02 DIAGNOSIS — H268 Other specified cataract: Secondary | ICD-10-CM | POA: Diagnosis not present

## 2020-07-02 DIAGNOSIS — H25812 Combined forms of age-related cataract, left eye: Secondary | ICD-10-CM | POA: Diagnosis not present

## 2020-07-03 DIAGNOSIS — H25813 Combined forms of age-related cataract, bilateral: Secondary | ICD-10-CM | POA: Diagnosis not present

## 2020-07-16 ENCOUNTER — Encounter: Payer: Self-pay | Admitting: Family Medicine

## 2020-07-16 DIAGNOSIS — Z1231 Encounter for screening mammogram for malignant neoplasm of breast: Secondary | ICD-10-CM | POA: Diagnosis not present

## 2020-07-19 DIAGNOSIS — H25811 Combined forms of age-related cataract, right eye: Secondary | ICD-10-CM | POA: Diagnosis not present

## 2020-07-19 DIAGNOSIS — Z961 Presence of intraocular lens: Secondary | ICD-10-CM | POA: Diagnosis not present

## 2020-08-13 DIAGNOSIS — H268 Other specified cataract: Secondary | ICD-10-CM | POA: Diagnosis not present

## 2020-08-13 DIAGNOSIS — H25811 Combined forms of age-related cataract, right eye: Secondary | ICD-10-CM | POA: Diagnosis not present

## 2020-08-13 DIAGNOSIS — H5709 Other anomalies of pupillary function: Secondary | ICD-10-CM | POA: Diagnosis not present

## 2020-08-30 MED ORDER — VALSARTAN-HYDROCHLOROTHIAZIDE 80-12.5 MG PO TABS: 1.0000 | ORAL_TABLET | Freq: Every day | ORAL | 1 refills | Status: DC

## 2021-02-16 ENCOUNTER — Other Ambulatory Visit: Payer: Self-pay | Admitting: Family Medicine

## 2021-03-25 ENCOUNTER — Telehealth: Payer: Self-pay | Admitting: Family Medicine

## 2021-03-25 NOTE — Telephone Encounter (Signed)
Left message for patient to call back and schedule Medicare Annual Wellness Visit (AWV) in office.   If not able to come in office, please offer to do virtually or by telephone.  Left office number and my jabber [phone].  Last AWV:01/01/2020  Please schedule at anytime with Nurse Health Advisor.

## 2021-04-04 ENCOUNTER — Telehealth (HOSPITAL_BASED_OUTPATIENT_CLINIC_OR_DEPARTMENT_OTHER): Payer: Self-pay

## 2021-04-04 ENCOUNTER — Ambulatory Visit (INDEPENDENT_AMBULATORY_CARE_PROVIDER_SITE_OTHER): Payer: Medicare Other

## 2021-04-04 VITALS — Ht 61.0 in | Wt 221.0 lb

## 2021-04-04 DIAGNOSIS — Z0000 Encounter for general adult medical examination without abnormal findings: Secondary | ICD-10-CM

## 2021-04-04 DIAGNOSIS — Z780 Asymptomatic menopausal state: Secondary | ICD-10-CM

## 2021-04-04 NOTE — Progress Notes (Signed)
Subjective:   Olivia Burns is a 70 y.o. female who presents for an Initial Medicare Annual Wellness Visit.   I connected with Chinmayi today by telephone and verified that I am speaking with the correct person using two identifiers. Location patient: home Location provider: work Persons participating in the virtual visit: patient, Marine scientist.    I discussed the limitations, risks, security and privacy concerns of performing an evaluation and management service by telephone and the availability of in person appointments. I also discussed with the patient that there may be a patient responsible charge related to this service. The patient expressed understanding and verbally consented to this telephonic visit.    Interactive audio and video telecommunications were attempted between this provider and patient, however failed, due to patient having technical difficulties OR patient did not have access to video capability.  We continued and completed visit with audio only.  Some vital signs may be absent or patient reported.   Time Spent with patient on telephone encounter: 25 minutes  Review of Systems     Cardiac Risk Factors include: advanced age (>68men, >74 women);hypertension;dyslipidemia;obesity (BMI >30kg/m2)     Objective:    Today's Vitals   04/04/21 0942  Weight: 221 lb (100.2 kg)  Height: 5\' 1"  (1.549 m)   Body mass index is 41.76 kg/m.  Advanced Directives 04/04/2021  Does Patient Have a Medical Advance Directive? No  Would patient like information on creating a medical advance directive? Yes (MAU/Ambulatory/Procedural Areas - Information given)    Current Medications (verified) Outpatient Encounter Medications as of 04/04/2021  Medication Sig   Ascorbic Acid (VITAMIN C-ROSE HIPS-ACEROLA PO) Take 1 capsule by mouth daily.   Calcium Carbonate-Vitamin D (OYSTER SHELL CALCIUM/VITAMIN D) 250-125 MG-UNIT TABS Take 1 tablet by mouth daily.   diclofenac Sodium (VOLTAREN) 1 %  GEL Apply 2 g topically 4 (four) times daily.   linaclotide (LINZESS) 145 MCG CAPS capsule Take 145 mcg by mouth daily before breakfast.   MAGNESIUM GLYCINATE PO Take 400 capsules by mouth daily.   Multiple Vitamins-Minerals (CENTRUM SILVER 50+WOMEN) TABS Take 1 tablet by mouth daily.   omeprazole (PRILOSEC) 40 MG capsule Take 40 mg by mouth daily.   QUERCETIN PO Take 1,000 mg by mouth daily.   TURMERIC PO Take 1,000 mg by mouth daily.   valsartan-hydrochlorothiazide (DIOVAN-HCT) 80-12.5 MG tablet TAKE 1 TABLET BY MOUTH EVERY DAY   No facility-administered encounter medications on file as of 04/04/2021.    Allergies (verified) Codeine, Penicillins, and Latex   History: Past Medical History:  Diagnosis Date   Allergy    Arthritis    Asthma    Depression    GERD (gastroesophageal reflux disease)    Hyperlipidemia    Hypertension    Past Surgical History:  Procedure Laterality Date   ABDOMINAL HYSTERECTOMY  2001   EYE SURGERY     HEMORRHOID SURGERY  1975   MENISCUS REPAIR Left 1999   miscarriage  44 and 56   Family History  Problem Relation Age of Onset   Arthritis Mother    Asthma Mother    Depression Mother    Diabetes Mother    Heart disease Mother    Kidney disease Mother    Arthritis Father    Depression Father    Hyperlipidemia Father    Hypertension Father    Cancer Father        prostate   Cancer Sister        gastric vs pancreatic  Drug abuse Brother    Early death Brother    Social History   Socioeconomic History   Marital status: Single    Spouse name: Not on file   Number of children: Not on file   Years of education: Not on file   Highest education level: Not on file  Occupational History   Not on file  Tobacco Use   Smoking status: Former   Smokeless tobacco: Never  Substance and Sexual Activity   Alcohol use: Yes    Comment: occasional   Drug use: Never   Sexual activity: Not on file  Other Topics Concern   Not on file  Social  History Narrative   Not on file   Social Determinants of Health   Financial Resource Strain: Low Risk    Difficulty of Paying Living Expenses: Not hard at all  Food Insecurity: No Food Insecurity   Worried About Charity fundraiser in the Last Year: Never true   [place] in the Last Year: Never true  Transportation Needs: No Transportation Needs   Lack of Transportation (Medical): No   Lack of Transportation (Non-Medical): No  Physical Activity: Sufficiently Active   Days of Exercise per Week: 7 days   Minutes of Exercise per Session: 30 min  Stress: No Stress Concern Present   Feeling of Stress : Only a little  Social Connections: Socially Isolated   Frequency of Communication with Friends and Family: More than three times a week   Frequency of Social Gatherings with Friends and Family: More than three times a week   Attends Religious Services: Never   Marine scientist or Organizations: No   Attends Music therapist: Never   Marital Status: Never married    Tobacco Counseling Counseling given: Not Answered   Clinical Intake:  Pre-visit preparation completed: Yes  Pain : No/denies pain     BMI - recorded: 41.76 Nutritional Status: BMI > 30  Obese Nutritional Risks: None Diabetes: No  How often do you need to have someone help you when you read instructions, pamphlets, or other written materials from your doctor or pharmacy?: 1 - Never  Diabetic?No  Interpreter Needed?: No  Information entered by :: Caroleen Hamman LPN   Activities of Daily Living In your present state of health, do you have any difficulty performing the following activities: 04/04/2021  Hearing? N  Vision? N  Difficulty concentrating or making decisions? N  Walking or climbing stairs? N  Dressing or bathing? N  Doing errands, shopping? N  Preparing Food and eating ? N  Using the Toilet? N  In the past six months, have you accidently leaked urine? N  Do you have  problems with loss of bowel control? N  Managing your Medications? N  Managing your Finances? N  Housekeeping or managing your Housekeeping? N  Some recent data might be hidden    Patient Care Team: Shelda Pal, DO as PCP - General (Family Medicine)  Indicate any recent Medical Services you may have received from other than [facility] providers in the past year (date may be approximate).     Assessment:   This is a routine wellness examination for Olivia Burns.  Hearing/Vision screen Hearing Screening - Comments:: No issues Vision Screening - Comments:: Last eye exam-03/2021-Dr. McFarling  Dietary issues and exercise activities discussed: Current Exercise Habits: Structured exercise class, Type of exercise: yoga;walking (chair yoga), Time (Minutes): 30, Frequency (Times/Week): 7, Weekly Exercise (Minutes/Week): 210, Intensity: Mild,  Exercise limited by: None identified   Goals Addressed             This Visit's Progress    Patient Stated       Eat healthier, drink more water & increase activity       Depression Screen PHQ 2/9 Scores 04/04/2021  PHQ - 2 Score 0    Fall Risk Fall Risk  04/04/2021 12/30/2017  Falls in the past year? 1 No  Comment - Emmi Telephone Survey: data to providers prior to load  Number falls in past yr: 0 -  Injury with Fall? 0 -  Risk for fall due to : History of fall(s) -  Follow up Falls prevention discussed -    FALL RISK PREVENTION PERTAINING TO THE HOME:  Any stairs in or around the home? No  Home free of loose throw rugs in walkways, pet beds, electrical cords, etc? No  Adequate lighting in your home to reduce risk of falls? Yes   ASSISTIVE DEVICES UTILIZED TO PREVENT FALLS:  Life alert? No  Use of a cane, walker or w/c? No  Grab bars in the bathroom? No  Shower chair or bench in shower? No  Elevated toilet seat or a handicapped toilet? No   TIMED UP AND GO:  Was the test performed? No . Phone visit   Cognitive  Function:Normal cognitive status assessed by  this Nurse Health Advisor. No abnormalities found.          Immunizations Immunization History  Administered Date(s) Administered   Influenza, High Dose Seasonal PF 03/07/2020   Influenza-Unspecified 03/02/2018, 03/04/2021   PFIZER(Purple Top)SARS-COV-2 Vaccination 07/16/2019, 08/10/2019, 03/09/2020   Pfizer Covid-19 Vaccine Bivalent Booster 27yrs & up 03/04/2021    TDAP status: Due, Education has been provided regarding the importance of this vaccine. Advised may receive this vaccine at local pharmacy or Health Dept. Aware to provide a copy of the vaccination record if obtained from local pharmacy or Health Dept. Verbalized acceptance and understanding.  Flu Vaccine status: Up to date  Pneumococcal vaccine status: Due, Education has been provided regarding the importance of this vaccine. Advised may receive this vaccine at local pharmacy or Health Dept. Aware to provide a copy of the vaccination record if obtained from local pharmacy or Health Dept. Verbalized acceptance and understanding.  Covid-19 vaccine status: Information provided on how to obtain vaccines.   Qualifies for Shingles Vaccine? Yes   Zostavax completed No   Shingrix Completed?: No.    Education has been provided regarding the importance of this vaccine. Patient has been advised to call insurance company to determine out of pocket expense if they have not yet received this vaccine. Advised may also receive vaccine at local pharmacy or Health Dept. Verbalized acceptance and understanding.  Screening Tests Health Maintenance  Topic Date Due   Pneumonia Vaccine 20+ Years old (1 - PCV) Never done   Hepatitis C Screening  Never done   TETANUS/TDAP  Never done   COLONOSCOPY (Pts 45-54yrs Insurance coverage will need to be confirmed)  Never done   Zoster Vaccines- Shingrix (1 of 2) Never done   MAMMOGRAM  07/16/2022   INFLUENZA VACCINE  Completed   DEXA SCAN  Completed    COVID-19 Vaccine  Completed   HPV VACCINES  Aged Out    Health Maintenance  Health Maintenance Due  Topic Date Due   Pneumonia Vaccine 75+ Years old (1 - PCV) Never done   Hepatitis C Screening  Never done  TETANUS/TDAP  Never done   COLONOSCOPY (Pts 45-71yrs Insurance coverage will need to be confirmed)  Never done   Zoster Vaccines- Shingrix (1 of 2) Never done    Colorectal cancer screening: Type of screening: Colonoscopy. Completed within the past 2 years per patient. Repeat every 5 years per patient. Requested report.  Mammogram status: Completed bilateral 07/16/2020. Repeat every year  Bone Density status: Ordered today. Pt provided with contact info and advised to call to schedule appt.  Lung Cancer Screening: (Low Dose CT Chest recommended if Age 47-80 years, 30 pack-year currently smoking OR have quit w/in 15years.) does not qualify.     Additional Screening:  Hepatitis C Screening: does qualify; Discuss with PCP  Vision Screening: Recommended annual ophthalmology exams for early detection of glaucoma and other disorders of the eye. Is the patient up to date with their annual eye exam?  Yes  Who is the provider or what is the name of the office in which the patient attends annual eye exams? Dr. Angelina Pih   Dental Screening: Recommended annual dental exams for proper oral hygiene  Community Resource Referral / Chronic Care Management: CRR required this visit?  No   CCM required this visit?  No      Plan:     I have personally reviewed and noted the following in the patient's chart:   Medical and social history Use of alcohol, tobacco or illicit drugs  Current medications and supplements including opioid prescriptions. Patient is not currently taking opioid prescriptions. Functional ability and status Nutritional status Physical activity Advanced directives List of other physicians Hospitalizations, surgeries, and ER visits in previous 12  months Vitals Screenings to include cognitive, depression, and falls Referrals and appointments  In addition, I have reviewed and discussed with patient certain preventive protocols, quality metrics, and best practice recommendations. A written personalized care plan for preventive services as well as general preventive health recommendations were provided to patient.   Due to this being a telephonic visit, the after visit summary with patients personalized plan was offered to patient via mail or my-chart.  Patient would like to access on my-chart.   Marta Antu, LPN   [id number]  Nurse Health Advisor  Nurse Notes: None

## 2021-04-04 NOTE — Patient Instructions (Signed)
[name] , Thank you for taking time to complete your Medicare Wellness Visit. I appreciate your ongoing commitment to your health goals. Please review the following plan we discussed and let me know if I can assist you in the future.   Screening recommendations/referrals: Colonoscopy: Per our conversation completed within the past few years. Please have report sent to PCP. Mammogram: Completed 07/16/2020-Due 07/16/2021 Bone Density: Ordered today. Someone will call you to schedule. Recommended yearly ophthalmology/optometry visit for glaucoma screening and checkup Recommended yearly dental visit for hygiene and checkup  Vaccinations: Influenza vaccine: Up to date Pneumococcal vaccine: Up to date-Please bring vaccine dates to your next appt. Tdap vaccine: Discuss with pharmacy Shingles vaccine: Discuss with pharmacy   Covid-19:Up to date  Advanced directives: Information mailed today  Conditions/risks identified: See problem list  Next appointment: Follow up in one year for your annual wellness visit 04/08/2022 @ 9:40   Preventive Care 65 Years and Older, Female Preventive care refers to lifestyle choices and visits with your health care provider that can promote health and wellness. What does preventive care include? A yearly physical exam. This is also called an annual well check. Dental exams once or twice a year. Routine eye exams. Ask your health care provider how often you should have your eyes checked. Personal lifestyle choices, including: Daily care of your teeth and gums. Regular physical activity. Eating a healthy diet. Avoiding tobacco and drug use. Limiting alcohol use. Practicing safe sex. Taking low-dose aspirin every day. Taking vitamin and mineral supplements as recommended by your health care provider. What happens during an annual well check? The services and screenings done by your health care provider during your annual well check will depend on your age,  overall health, lifestyle risk factors, and family history of disease. Counseling  Your health care provider may ask you questions about your: Alcohol use. Tobacco use. Drug use. Emotional well-being. Home and relationship well-being. Sexual activity. Eating habits. History of falls. Memory and ability to understand (cognition). Work and work Statistician. Reproductive health. Screening  You may have the following tests or measurements: Height, weight, and BMI. Blood pressure. Lipid and cholesterol levels. These may be checked every 5 years, or more frequently if you are over 69 years old. Skin check. Lung cancer screening. You may have this screening every year starting at age 71 if you have a 30-pack-year history of smoking and currently smoke or have quit within the past 15 years. Fecal occult blood test (FOBT) of the stool. You may have this test every year starting at age 71. Flexible sigmoidoscopy or colonoscopy. You may have a sigmoidoscopy every 5 years or a colonoscopy every 10 years starting at age 71. Hepatitis C blood test. Hepatitis B blood test. Sexually transmitted disease (STD) testing. Diabetes screening. This is done by checking your blood sugar (glucose) after you have not eaten for a while (fasting). You may have this done every 1-3 years. Bone density scan. This is done to screen for osteoporosis. You may have this done starting at age 71. Mammogram. This may be done every 1-2 years. Talk to your health care provider about how often you should have regular mammograms. Talk with your health care provider about your test results, treatment options, and if necessary, the need for more tests. Vaccines  Your health care provider may recommend certain vaccines, such as: Influenza vaccine. This is recommended every year. Tetanus, diphtheria, and acellular pertussis (Tdap, Td) vaccine. You may need a Td booster every 10  years. Zoster vaccine. You may need this after age  15. Pneumococcal 13-valent conjugate (PCV13) vaccine. One dose is recommended after age 63. Pneumococcal polysaccharide (PPSV23) vaccine. One dose is recommended after age 49. Talk to your health care provider about which screenings and vaccines you need and how often you need them. This information is not intended to replace advice given to you by your health care provider. Make sure you discuss any questions you have with your health care provider. Document Released: 06/15/2015 Document Revised: 02/06/2016 Document Reviewed: 03/20/2015 Elsevier Interactive Patient Education  2017 [place] Prevention in the Home Falls can cause injuries. They can happen to people of all ages. There are many things you can do to make your home safe and to help prevent falls. What can I do on the outside of my home? Regularly fix the edges of walkways and driveways and fix any cracks. Remove anything that might make you trip as you walk through a door, such as a raised step or threshold. Trim any bushes or trees on the path to your home. Use bright outdoor lighting. Clear any walking paths of anything that might make someone trip, such as rocks or tools. Regularly check to see if handrails are loose or broken. Make sure that both sides of any steps have handrails. Any raised decks and porches should have guardrails on the edges. Have any leaves, snow, or ice cleared regularly. Use sand or salt on walking paths during winter. Clean up any spills in your garage right away. This includes oil or grease spills. What can I do in the bathroom? Use night lights. Install grab bars by the toilet and in the tub and shower. Do not use towel bars as grab bars. Use non-skid mats or decals in the tub or shower. If you need to sit down in the shower, use a plastic, non-slip stool. Keep the floor dry. Clean up any water that spills on the floor as soon as it happens. Remove soap buildup in the tub or shower  regularly. Attach bath mats securely with double-sided non-slip rug tape. Do not have throw rugs and other things on the floor that can make you trip. What can I do in the bedroom? Use night lights. Make sure that you have a light by your bed that is easy to reach. Do not use any sheets or blankets that are too big for your bed. They should not hang down onto the floor. Have a firm chair that has side arms. You can use this for support while you get dressed. Do not have throw rugs and other things on the floor that can make you trip. What can I do in the kitchen? Clean up any spills right away. Avoid walking on wet floors. Keep items that you use a lot in easy-to-reach places. If you need to reach something above you, use a strong step stool that has a grab bar. Keep electrical cords out of the way. Do not use floor polish or wax that makes floors slippery. If you must use wax, use non-skid floor wax. Do not have throw rugs and other things on the floor that can make you trip. What can I do with my stairs? Do not leave any items on the stairs. Make sure that there are handrails on both sides of the stairs and use them. Fix handrails that are broken or loose. Make sure that handrails are as long as the stairways. Check any carpeting to make sure  that it is firmly attached to the stairs. Fix any carpet that is loose or worn. Avoid having throw rugs at the top or bottom of the stairs. If you do have throw rugs, attach them to the floor with carpet tape. Make sure that you have a light switch at the top of the stairs and the bottom of the stairs. If you do not have them, ask someone to add them for you. What else can I do to help prevent falls? Wear shoes that: Do not have high heels. Have rubber bottoms. Are comfortable and fit you well. Are closed at the toe. Do not wear sandals. If you use a stepladder: Make sure that it is fully opened. Do not climb a closed stepladder. Make sure that  both sides of the stepladder are locked into place. Ask someone to hold it for you, if possible. Clearly mark and make sure that you can see: Any grab bars or handrails. First and last steps. Where the edge of each step is. Use tools that help you move around (mobility aids) if they are needed. These include: Canes. Walkers. Scooters. Crutches. Turn on the lights when you go into a dark area. Replace any light bulbs as soon as they burn out. Set up your furniture so you have a clear path. Avoid moving your furniture around. If any of your floors are uneven, fix them. If there are any pets around you, be aware of where they are. Review your medicines with your doctor. Some medicines can make you feel dizzy. This can increase your chance of falling. Ask your doctor what other things that you can do to help prevent falls. This information is not intended to replace advice given to you by your health care provider. Make sure you discuss any questions you have with your health care provider. Document Released: 03/15/2009 Document Revised: 10/25/2015 Document Reviewed: 06/23/2014 Elsevier Interactive Patient Education  2017 [employer].

## 2021-04-11 ENCOUNTER — Ambulatory Visit (HOSPITAL_BASED_OUTPATIENT_CLINIC_OR_DEPARTMENT_OTHER)
Admission: RE | Admit: 2021-04-11 | Discharge: 2021-04-11 | Disposition: A | Discharge status: Home / Self Care | Payer: Medicare Other | Source: Ambulatory Visit | Attending: Family Medicine | Admitting: Family Medicine

## 2021-04-11 ENCOUNTER — Other Ambulatory Visit: Payer: Self-pay

## 2021-04-11 DIAGNOSIS — Z780 Asymptomatic menopausal state: Secondary | ICD-10-CM | POA: Diagnosis not present

## 2021-04-17 DIAGNOSIS — H04123 Dry eye syndrome of bilateral lacrimal glands: Secondary | ICD-10-CM | POA: Diagnosis not present

## 2021-04-17 DIAGNOSIS — Z961 Presence of intraocular lens: Secondary | ICD-10-CM | POA: Diagnosis not present

## 2021-07-09 ENCOUNTER — Ambulatory Visit (INDEPENDENT_AMBULATORY_CARE_PROVIDER_SITE_OTHER): Payer: Medicare Other | Admitting: Family Medicine

## 2021-07-09 ENCOUNTER — Telehealth: Payer: Self-pay | Admitting: *Deleted

## 2021-07-09 ENCOUNTER — Other Ambulatory Visit: Payer: Self-pay | Admitting: Family Medicine

## 2021-07-09 ENCOUNTER — Encounter: Payer: Self-pay | Admitting: Family Medicine

## 2021-07-09 VITALS — BP 114/60 | HR 67 | Temp 98.1°F | Ht 61.0 in | Wt 223.4 lb

## 2021-07-09 DIAGNOSIS — J0110 Acute frontal sinusitis, unspecified: Secondary | ICD-10-CM

## 2021-07-09 DIAGNOSIS — R5383 Other fatigue: Secondary | ICD-10-CM

## 2021-07-09 DIAGNOSIS — E538 Deficiency of other specified B group vitamins: Secondary | ICD-10-CM

## 2021-07-09 DIAGNOSIS — R7303 Prediabetes: Secondary | ICD-10-CM

## 2021-07-09 DIAGNOSIS — E785 Hyperlipidemia, unspecified: Secondary | ICD-10-CM | POA: Diagnosis not present

## 2021-07-09 DIAGNOSIS — J069 Acute upper respiratory infection, unspecified: Secondary | ICD-10-CM

## 2021-07-09 DIAGNOSIS — E559 Vitamin D deficiency, unspecified: Secondary | ICD-10-CM

## 2021-07-09 DIAGNOSIS — I10 Essential (primary) hypertension: Secondary | ICD-10-CM | POA: Diagnosis not present

## 2021-07-09 LAB — COMPREHENSIVE METABOLIC PANEL
ALT: 13 U/L (ref 0–35)
AST: 15 U/L (ref 0–37)
Albumin: 4.2 g/dL (ref 3.5–5.2)
Alkaline Phosphatase: 83 U/L (ref 39–117)
BUN: 10 mg/dL (ref 6–23)
CO2: 34 mEq/L — ABNORMAL HIGH (ref 19–32)
Calcium: 9.5 mg/dL (ref 8.4–10.5)
Chloride: 102 mEq/L (ref 96–112)
Creatinine, Ser: 0.88 mg/dL (ref 0.40–1.20)
GFR: 66.21 mL/min (ref >60.00)
Glucose, Bld: 107 mg/dL — ABNORMAL HIGH (ref 70–99)
Potassium: 3.8 mEq/L (ref 3.5–5.1)
Sodium: 140 mEq/L (ref 135–145)
Total Bilirubin: 0.3 mg/dL (ref 0.2–1.2)
Total Protein: 7.1 g/dL (ref 6.0–8.3)

## 2021-07-09 LAB — LIPID PANEL
Cholesterol: 219 mg/dL — ABNORMAL HIGH (ref 0–200)
HDL: 51 mg/dL (ref >39.00)
LDL Cholesterol: 141 mg/dL — ABNORMAL HIGH (ref 0–99)
NonHDL: 168.27
Total CHOL/HDL Ratio: 4
Triglycerides: 138 mg/dL (ref 0.0–149.0)
VLDL: 27.6 mg/dL (ref 0.0–40.0)

## 2021-07-09 LAB — HEMOGLOBIN A1C: Hgb A1c MFr Bld: 6.2 % (ref 4.6–6.5)

## 2021-07-09 LAB — VITAMIN B12: Vitamin B-12: >1504 pg/mL — ABNORMAL HIGH (ref 211–911)

## 2021-07-09 LAB — VITAMIN D 25 HYDROXY (VIT D DEFICIENCY, FRACTURES): VITD: 117.36 ng/mL (ref 30.00–100.00)

## 2021-07-09 LAB — TSH: TSH: 2.06 u[IU]/mL (ref 0.35–5.50)

## 2021-07-09 MED ORDER — BENZONATATE 200 MG PO CAPS: 200.0000 mg | ORAL_CAPSULE | Freq: Two times a day (BID) | ORAL | 0 refills | Status: DC | PRN

## 2021-07-09 NOTE — Progress Notes (Signed)
Chief Complaint  Patient presents with   Follow-up    Fatigue Labs today   Cough    Subjective: Patient is a 71 y.o. female here for fatigue.  Duration: 1 week  Associated symptoms: subjective fever, sinus headache, sinus congestion, sinus pain, rhinorrhea, itchy watery eyes, wheezing, chest tightness, and coughing Denies: ear pain, ear drainage, sore throat, shortness of breath, and N/V/D, loss of taste/smell Treatment to date: tea w honey, Coricidin, Tylenol Sick contacts: No Home covid test neg.   Was sick in Dec and had fatigue afterwards that has lingered since then. Eating/drinking normally overall. No unexplained weight changes, exercise. She does not sleep well dealing with bouts of insomnia. She stays up late and gets up around 5-6 AM. She does not feel well rested in AM. She has never been told she stops breathing at night or wakes up gasping for air. She has never had a sleep study. She will get drowsy and nod off in afternoon.   Past Medical History:  Diagnosis Date   Allergy    Arthritis    Asthma    Depression    GERD (gastroesophageal reflux disease)    Hyperlipidemia    Hypertension     Objective: BP 114/60    Pulse 67    Temp 98.1 F (36.7 C) (Oral)    Ht 5\' 1"  (1.549 m)    Wt 223 lb 6 oz (101.3 kg)    SpO2 98%    BMI 42.21 kg/m  General: Awake, appears stated age HEENT: ears neg b/l, nares patent w/o dc, MMM, no exudates or erythema in pharynx Neck: supple, symmetric, no thyromegaly or nodules noted Heart: RRR, no LE edema Lungs: CTAB, no rales, wheezes or rhonchi. No accessory muscle use Psych: Age appropriate judgment and insight, normal affect and mood  Assessment and Plan: Acute frontal sinusitis, recurrence not specified  Viral URI with cough - Plan: benzonatate (TESSALON) 200 MG capsule  Fatigue, unspecified type - Plan: TSH  Essential hypertension - Plan: Comprehensive metabolic panel  Hyperlipidemia, unspecified hyperlipidemia type - Plan:  Lipid panel  Prediabetes - Plan: Hemoglobin A1c  B12 deficiency - Plan: B12  Vitamin D insufficiency - Plan: VITAMIN D 25 Hydroxy (Vit-D Deficiency, Fractures)  1/2. Supportive care, Tessalon perles prn, let me know if no improvement over next week.  3.  New problem, uncertain prognosis.  Check labs.  If they are normal, we will refer to the pulmonary team for OSA evaluation. Follow-up pending the above. The patient voiced understanding and agreement to the plan.  [place], DO 07/09/21  10:39 AM

## 2021-07-09 NOTE — Telephone Encounter (Signed)
CRITICAL VALUE STICKER  CRITICAL VALUE: Vit D  117.36  RECEIVER (on-site recipient of call): Kelle Darting, [place] NOTIFIED: 07/09/21 @ 2:36pm  MESSENGER (representative from lab): [place]   MD NOTIFIED: Nani Ravens  TIME OF NOTIFICATION:  RESPONSE:

## 2021-07-09 NOTE — Patient Instructions (Addendum)
Give [place] 2-3 business days to get the results of your labs back.   Keep the diet clean and stay active.  If labs are normal, we will refer to the sleep team.   Continue to push fluids, practice good hand hygiene, and cover your mouth if you cough.  If you start having fevers, shaking or shortness of breath, seek immediate care.  Let us know if you need anything.

## 2021-07-22 DIAGNOSIS — Z1231 Encounter for screening mammogram for malignant neoplasm of breast: Secondary | ICD-10-CM | POA: Diagnosis not present

## 2021-07-22 LAB — HM MAMMOGRAPHY

## 2021-07-23 ENCOUNTER — Encounter: Payer: Self-pay | Admitting: Family Medicine

## 2021-07-25 NOTE — Progress Notes (Signed)
Synopsis: Referred for excessive daytime sleepiness by Shelda Pal*  Subjective:   PATIENT ID: Olivia Burns GENDER: female DOB: 02/16/1951, MRN: [id number]  Chief Complaint  Patient presents with   Pulmonary Consult    Referred by Riki Sheer, DO for eval of fatigue- started in June 20222. She states she has some wheezing and occ cough- prod with cloudy to clear sputum. She has been told that she snores. Feels sleepy all day. Epworth score = 21.    71yF with history of asthma - sounds mild intermittent and uses albuterol inhaler as needed, allergy, GERD, HTN, depression seen for excessive daytime sleepiness with concern for OSA  Seen recently by PCP with possible viral URI but concern as well for OSA.  She has been feeling sleepy for a while since at least last year. She snores loudly - wakes herself up. No PND. Very sleepy during the day. She does have some RLS sensation. She does do some sleep-talking. She doesn't think she sleep walks or gets up and eats or does anything else unusual at night. She has never had seizures before.   She has never had sleep study before.   She may take melatonin on occasion.   Otherwise pertinent review of systems is negative.  No family history of lung disease.   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 She has never lived outside of [place]. She smoked only every now and then. 2 packs in her lifetime.     Past Medical History:  Diagnosis Date   Allergy    Arthritis    Asthma    Depression    GERD (gastroesophageal reflux disease)    Hyperlipidemia    Hypertension      Family History  Problem Relation Age of Onset   Arthritis Mother    Asthma Mother    Depression Mother    Diabetes Mother    Heart disease Mother    Kidney disease Mother    Arthritis Father    Depression Father    Hyperlipidemia Father    Hypertension Father    Cancer Father        prostate   Cancer Sister        gastric vs pancreatic   Drug  abuse Brother    Early death Brother      Past Surgical History:  Procedure Laterality Date   ABDOMINAL HYSTERECTOMY  2001   EYE SURGERY     HEMORRHOID SURGERY  1975   MENISCUS REPAIR Left 1999   miscarriage  69 and 1979    Social History   Socioeconomic History   Marital status: Single    Spouse name: Not on file   Number of children: Not on file   Years of education: Not on file   Highest education level: Not on file  Occupational History   Not on file  Tobacco Use   Smoking status: Former    Types: Cigarettes   Smokeless tobacco: Never   Tobacco comments:    "Tried smoking"   Vaping Use   Vaping Use: Never used  Substance and Sexual Activity   Alcohol use: Yes    Comment: occasional   Drug use: Never   Sexual activity: Not on file  Other Topics Concern   Not on file  Social History Narrative   Not on file   Social Determinants of Health   Financial Resource Strain: Low Risk    Difficulty of Paying Living Expenses: Not hard at all  Food Insecurity: No Food Insecurity   Worried About Charity fundraiser in the Last Year: Never true   Ran Out of Food in the Last Year: Never true  Transportation Needs: No Transportation Needs   Lack of Transportation (Medical): No   Lack of Transportation (Non-Medical): No  Physical Activity: Sufficiently Active   Days of Exercise per Week: 7 days   Minutes of Exercise per Session: 30 min  Stress: No Stress Concern Present   Feeling of Stress : Only a little  Social Connections: Socially Isolated   Frequency of Communication with Friends and Family: More than three times a week   Frequency of Social Gatherings with Friends and Family: More than three times a week   Attends Religious Services: Never   Marine scientist or Organizations: No   Attends Music therapist: Never   Marital Status: Never married  Human resources officer Violence: Not At Risk   Fear of Current or Ex-Partner: No   Emotionally Abused:  No   Physically Abused: No   Sexually Abused: No     Allergies  Allergen Reactions   Codeine Other (See Comments)    Problems breathing   Penicillins Itching   Latex Rash     Outpatient Medications Prior to Visit  Medication Sig Dispense Refill   Ascorbic Acid (VITAMIN C-ROSE HIPS-ACEROLA PO) Take 1 capsule by mouth daily.     benzonatate (TESSALON) 200 MG capsule Take 1 capsule (200 mg total) by mouth 2 (two) times daily as needed for cough. 20 capsule 0   diclofenac Sodium (VOLTAREN) 1 % GEL Apply 2 g topically 4 (four) times daily.     linaclotide (LINZESS) 145 MCG CAPS capsule Take 145 mcg by mouth daily before breakfast.     MAGNESIUM GLYCINATE PO Take 400 capsules by mouth daily.     Multiple Vitamins-Minerals (CENTRUM SILVER 50+WOMEN) TABS Take 1 tablet by mouth daily.     omeprazole (PRILOSEC) 20 MG capsule Take 40 mg by mouth daily.     TURMERIC PO Take 1,000 mg by mouth daily.     valsartan-hydrochlorothiazide (DIOVAN-HCT) 80-12.5 MG tablet TAKE 1 TABLET BY MOUTH EVERY DAY 90 tablet 1   Calcium Carbonate-Vitamin D (OYSTER SHELL CALCIUM/VITAMIN D) 250-125 MG-UNIT TABS Take 1 tablet by mouth daily.     omeprazole (PRILOSEC) 40 MG capsule Take 40 mg by mouth daily. (Patient not taking: Reported on 07/26/2021)     QUERCETIN PO Take 1,000 mg by mouth daily.     No facility-administered medications prior to visit.       Objective:   Physical Exam:  General appearance: 71 y.o., female, NAD, conversant  Eyes: anicteric sclerae; PERRL, tracking appropriately HENT: NCAT; MMM, mallampati 3 Neck: Trachea midline; no lymphadenopathy, no JVD Lungs: CTAB, no crackles, no wheeze, with normal respiratory effort CV: RRR, no murmur  Abdomen: Soft, non-tender; non-distended, BS present  Extremities: No peripheral edema, warm Skin: Normal turgor and texture; no rash Psych: Appropriate affect Neuro: Alert and oriented to person and place, no focal deficit     Vitals:    07/26/21 1044  BP: 134/78  Pulse: 70  Temp: 97.7 F (36.5 C)  TempSrc: Oral  SpO2: 98%  Weight: 224 lb 3.2 oz (101.7 kg)  Height: 5\' 1"  (1.549 m)   98% on RA BMI Readings from Last 3 Encounters:  07/26/21 42.36 kg/m  07/09/21 42.21 kg/m  04/04/21 41.76 kg/m   Wt Readings from Last 3 Encounters:  07/26/21 224  lb 3.2 oz (101.7 kg)  07/09/21 223 lb 6 oz (101.3 kg)  04/04/21 221 lb (100.2 kg)     CBC No results found for: WBC, RBC, HGB, HCT, PLT, MCV, MCH, MCHC, RDW, LYMPHSABS, MONOABS, EOSABS, BASOSABS  Bicarb 33-34  Chest Imaging: None available  Pulmonary Functions Testing Results: No flowsheet data found.   Echocardiogram:  None available     Assessment & Plan:   # Excessive daytime sleepiness # Snoring Elevated bicarb may be effect of hctz, alternatively could reflect OHS. We'll start with seeing if she has OSA, trial treatment with CPAP or autoPAP if present and then follow serum bicarb, clinical response to PAP before consideration of workup for BiPAP, etc.  # Restless legs  # Severe obesity  Plan: - home sleep study - check ferritin - TLC measures for weight loss discussed with target weight loss 10%. Referred to medical weight loss clinic.  RTC 4 months   Maryjane Hurter, MD [facility] 07/26/2021 10:48 AM

## 2021-07-26 ENCOUNTER — Encounter: Payer: Self-pay | Admitting: Student

## 2021-07-26 ENCOUNTER — Ambulatory Visit (INDEPENDENT_AMBULATORY_CARE_PROVIDER_SITE_OTHER): Payer: Medicare Other | Admitting: Student

## 2021-07-26 ENCOUNTER — Other Ambulatory Visit: Payer: Self-pay

## 2021-07-26 DIAGNOSIS — G2581 Restless legs syndrome: Secondary | ICD-10-CM | POA: Diagnosis not present

## 2021-07-26 DIAGNOSIS — R0683 Snoring: Secondary | ICD-10-CM | POA: Diagnosis not present

## 2021-07-26 DIAGNOSIS — R5382 Chronic fatigue, unspecified: Secondary | ICD-10-CM | POA: Diagnosis not present

## 2021-07-26 DIAGNOSIS — R638 Other symptoms and signs concerning food and fluid intake: Secondary | ICD-10-CM

## 2021-07-26 LAB — FERRITIN: Ferritin: 39.5 ng/mL (ref 10.0–291.0)

## 2021-07-26 NOTE — Patient Instructions (Signed)
-   Labs today  - You will be called to schedule your sleep study and weight loss clinic referral - See you in 4 months!

## 2021-07-31 NOTE — Telephone Encounter (Signed)
See lab result note for plan. ?

## 2021-08-23 ENCOUNTER — Other Ambulatory Visit: Payer: Self-pay | Admitting: Family Medicine

## 2021-09-24 ENCOUNTER — Ambulatory Visit: Payer: Medicare Other

## 2021-09-24 DIAGNOSIS — R0683 Snoring: Secondary | ICD-10-CM

## 2021-09-25 ENCOUNTER — Ambulatory Visit: Payer: Medicare Other

## 2021-09-27 ENCOUNTER — Ambulatory Visit: Payer: Medicare Other

## 2021-09-27 DIAGNOSIS — G4710 Hypersomnia, unspecified: Secondary | ICD-10-CM

## 2021-09-27 DIAGNOSIS — R0683 Snoring: Secondary | ICD-10-CM

## 2021-10-02 DIAGNOSIS — G4710 Hypersomnia, unspecified: Secondary | ICD-10-CM | POA: Diagnosis not present

## 2021-10-02 DIAGNOSIS — R0683 Snoring: Secondary | ICD-10-CM

## 2021-10-07 ENCOUNTER — Encounter: Payer: Self-pay | Admitting: Family Medicine

## 2021-10-07 ENCOUNTER — Ambulatory Visit (INDEPENDENT_AMBULATORY_CARE_PROVIDER_SITE_OTHER): Payer: Medicare Other | Admitting: Family Medicine

## 2021-10-07 VITALS — BP 120/68 | HR 69 | Temp 97.9°F | Ht 61.5 in | Wt 228.5 lb

## 2021-10-07 DIAGNOSIS — Z1159 Encounter for screening for other viral diseases: Secondary | ICD-10-CM

## 2021-10-07 DIAGNOSIS — R5381 Other malaise: Secondary | ICD-10-CM | POA: Diagnosis not present

## 2021-10-07 DIAGNOSIS — R748 Abnormal levels of other serum enzymes: Secondary | ICD-10-CM

## 2021-10-07 DIAGNOSIS — E673 Hypervitaminosis D: Secondary | ICD-10-CM | POA: Diagnosis not present

## 2021-10-07 LAB — VITAMIN D 25 HYDROXY (VIT D DEFICIENCY, FRACTURES): VITD: 70.78 ng/mL (ref 30.00–100.00)

## 2021-10-07 LAB — VITAMIN B12: Vitamin B-12: 1154 pg/mL — ABNORMAL HIGH (ref 211–911)

## 2021-10-07 NOTE — Patient Instructions (Addendum)
Give [place] 2-3 business days to get the results of your labs back.  ? ?Stay active. Consider weight resistance exercise.  ? ?If you do not hear anything about your referral in the next 1-2 weeks, call our office and ask for an update. ? ?Try to drink 55-60 oz of water daily outside of exercise. ? ?Take Metamucil or Benefiber daily. ? ?Let us know if you need anything. ?

## 2021-10-07 NOTE — Progress Notes (Signed)
Chief Complaint  ?Patient presents with  ? Follow-up  ?  Vitamin D and B12  ? ? ?Subjective: ?Patient is a 71 y.o. female here for f/u. ? ?Patient had elevated vitamin D and B12 3 months ago.  Since then, she stopped extra supplementation and continued only a multivitamin.  She is actually felt better.  She feels that she is getting weaker overall with intermittent balance issues.  She had physical therapy for shoulder injury years ago and is interested in having this for strengthening.  She walks twice daily with her dogs. ? ?Past Medical History:  ?Diagnosis Date  ? Allergy   ? Arthritis   ? Asthma   ? Depression   ? GERD (gastroesophageal reflux disease)   ? Hyperlipidemia   ? Hypertension   ? ? ?Objective: ?BP 120/68   Pulse 69   Temp 97.9 ?F (36.6 ?C) (Oral)   Ht 5' 1.5" (1.562 m)   Wt 228 lb 8 oz (103.6 kg)   SpO2 99%   BMI 42.48 kg/m?  ?General: Awake, appears stated age ?Heart: RRR, no LE edema ?Lungs: CTAB, no rales, wheezes or rhonchi. No accessory muscle use ?Psych: Age appropriate judgment and insight, normal affect and mood ? ?Assessment and Plan: ?High vitamin D level - Plan: VITAMIN D 25 Hydroxy (Vit-D Deficiency, Fractures) ? ?Elevated vitamin B12 level - Plan: B12 ? ?Physical deconditioning - Plan: Ambulatory referral to Physical Therapy ? ?Encounter for hepatitis C screening test for low risk patient - Plan: Hepatitis C antibody ? ?1/2.  Check levels today ?3.  Counseled on weight resistance exercise in addition to walking.  Refer to physical therapy for this as well. ?Follow-up in 6 months for medication check or as needed. ?The patient voiced understanding and agreement to the plan. ? ?Shelda Pal, DO ?10/07/21  ?9:27 AM ? ? ? ? ?

## 2021-10-08 DIAGNOSIS — K219 Gastro-esophageal reflux disease without esophagitis: Secondary | ICD-10-CM | POA: Diagnosis not present

## 2021-10-08 DIAGNOSIS — K5904 Chronic idiopathic constipation: Secondary | ICD-10-CM | POA: Diagnosis not present

## 2021-10-08 DIAGNOSIS — Z86010 Personal history of colonic polyps: Secondary | ICD-10-CM | POA: Diagnosis not present

## 2021-10-08 LAB — HEPATITIS C ANTIBODY
Hepatitis C Ab: NONREACTIVE
SIGNAL TO CUT-OFF: 0.09 (ref <1.00)

## 2021-10-16 ENCOUNTER — Encounter: Payer: Self-pay | Admitting: Physical Therapy

## 2021-10-16 ENCOUNTER — Ambulatory Visit: Payer: Medicare Other | Attending: Family Medicine | Admitting: Physical Therapy

## 2021-10-16 DIAGNOSIS — R278 Other lack of coordination: Secondary | ICD-10-CM | POA: Insufficient documentation

## 2021-10-16 DIAGNOSIS — R262 Difficulty in walking, not elsewhere classified: Secondary | ICD-10-CM | POA: Diagnosis not present

## 2021-10-16 DIAGNOSIS — R293 Abnormal posture: Secondary | ICD-10-CM | POA: Diagnosis not present

## 2021-10-16 DIAGNOSIS — M6281 Muscle weakness (generalized): Secondary | ICD-10-CM | POA: Insufficient documentation

## 2021-10-16 DIAGNOSIS — R5381 Other malaise: Secondary | ICD-10-CM | POA: Insufficient documentation

## 2021-10-16 DIAGNOSIS — R2681 Unsteadiness on feet: Secondary | ICD-10-CM | POA: Insufficient documentation

## 2021-10-16 NOTE — Therapy (Signed)
?OUTPATIENT PHYSICAL THERAPY LOWER EXTREMITY EVALUATION ? ? ?Patient Name: Olivia Burns ?MRN: [id number] ?DOB:06/09/1950, 71 y.o., female ?Today's Date: 10/16/2021 ? ? PT End of Session - 10/16/21 1308   ? ? Visit Number 1   ? Date for PT Re-Evaluation 12/25/21   ? PT Start Time 1015   ? PT Stop Time 1058   ? PT Time Calculation (min) 43 min   ? Activity Tolerance Patient tolerated treatment well   ? Behavior During Therapy [facility] for tasks assessed/performed   ? ?  ?  ? ?  ? ? ?Past Medical History:  ?Diagnosis Date  ? Allergy   ? Arthritis   ? Asthma   ? Depression   ? GERD (gastroesophageal reflux disease)   ? Hyperlipidemia   ? Hypertension   ? ?Past Surgical History:  ?Procedure Laterality Date  ? ABDOMINAL HYSTERECTOMY  2001  ? EYE SURGERY    ? [place]  ? MENISCUS REPAIR Left 1999  ? miscarriage  1977 and 1979  ? ?Patient Active Problem List  ? Diagnosis Date Noted  ? Essential hypertension 05/07/2020  ? Prediabetes 05/07/2020  ? Hyperlipidemia 05/07/2020  ? ? ?PCP: Nani Ravens ? ?REFERRING PROVIDER: Shelda Pal, DO  ? ?REFERRING DIAG: R53.81 (ICD-10-CM) - Physical deconditioning  ? ?THERAPY DIAG:  ?Unsteadiness on feet ? ?Other lack of coordination ? ?Muscle weakness (generalized) ? ?Difficulty in walking, not elsewhere classified ? ?Abnormal posture ? ?ONSET DATE: 10/07/2021  ? ?SUBJECTIVE:  ? ?SUBJECTIVE STATEMENT: ?She feels that she is getting weaker overall with intermittent balance issues. She had physical therapy for shoulder injury years ago and is interested in having this for strengthening. She walks twice daily with her dogs. Patient reports that she was referred to start performing more Wbing exercises. She does not feel like she can do it on her own, so she would like some guidance. Bad R knee. She feels she overused it when she tore her L meniscus. She has arthritis in R knee. ? ?PERTINENT HISTORY:  ?See above ? ? ?PAIN:  ?Are you having pain? No ? ?PRECAUTIONS:  None ? ?WEIGHT BEARING RESTRICTIONS No ? ?FALLS:  ?Has patient fallen in last 6 months? No ? ?LIVING ENVIRONMENT: ?Lives with: lives alone ?Lives in: House/apartment ?Stairs: Yes: External: 2 steps; none ?Has following equipment at home:  Knee brace, and None ? ?OCCUPATION: Lets friends dogs out. ? ?PLOF: Independent ? ?PATIENT GOALS Be able to get back to feeling healthy and exercise. Avoid being a couch potato. Her knee had held her back, along with multiple sinus infections. Walk her dog, dance. ? ? ?OBJECTIVE:  ? ? ? ? ? ?COGNITION: ? Overall cognitive status: Within functional limits for tasks assessed   ?  ?SENSATION: ?Not tested ? ?MUSCLE LENGTH: ?Hamstrings: WNL B ? ? ?POSTURE:  ?Forward head with dowagers hump, R knee maintained in genu valgum and ER ? ? ? ? ?LE ROM: ? ?Grossly WNL B U and LE ? ?LE MMT: ? ?MMT Right ?10/16/2021 Left ?10/16/2021  ?Hip flexion 4 4  ?Hip extension 4- 4-  ?Hip abduction 4 4  ?Hip adduction    ?Hip internal rotation    ?Hip external rotation    ?Knee flexion 4 4  ?Knee extension 4 4  ?Ankle dorsiflexion 4 4  ?Ankle plantarflexion    ?Ankle inversion    ?Ankle eversion    ? (Blank rows = not tested) ? ? ? ?FUNCTIONAL TESTS:  ?5 times sit to  stand: 16.84 ?Timed up and go (TUG): 10.34 ?6 minute walk test: 1000'. Steady pace, no rest breaks required. ? ? ? ?GAIT:  Step through pattern with RLE maintained in slight ER and R knee valgus. ? ? ? ? ?TODAY'S TREATMENT: ?10/16/21 ?Gait training with VC for step through and weight shift during the 6 minute walk test for 1000' ? ? ?PATIENT EDUCATION:  ?Education details: POC  ?Person educated: Patient ?Education method: Explanation ?Education comprehension: verbalized understanding ? ? ?HOME EXERCISE PROGRAM: ?TBD ? ?ASSESSMENT: ? ?CLINICAL IMPRESSION: ?Patient is a 71 y.o. who was seen today for physical therapy evaluation and treatment for weakness.  ? ? ?OBJECTIVE IMPAIRMENTS Abnormal gait, decreased activity tolerance, decreased balance,  decreased coordination, difficulty walking, decreased strength, improper body mechanics, postural dysfunction, and pain.  ? ?ACTIVITY LIMITATIONS cleaning, community activity, yard work, and shopping.  ? ?PERSONAL FACTORS Age, Fitness, Past/current experiences, and 1 comorbidity: R knee arthritis  are also affecting patient's functional outcome.  ? ? ?REHAB POTENTIAL: Good ? ?CLINICAL DECISION MAKING: Stable/uncomplicated ? ?EVALUATION COMPLEXITY: Low ? ? ?GOALS: ?Goals reviewed with patient? Yes ? ?SHORT TERM GOALS: Target date: 11/13/2021  (Remove Blue Hyperlink) ? ?Patient will be I with initial HEP ?Baseline: ?Goal status: INITIAL ? ? ? ?LONG TERM GOALS: Target date: 12/25/2021  (Remove Blue Hyperlink) ? ?Patient will be I final HEP ?Baseline:  ?Goal status: INITIAL ? ?2.  Patient will perform 5x STS in < 8 sec to demonstrate improved BLE strength. ?Baseline:  ?Goal status: INITIAL ? ?3.  Patient will ambulate at least 2000' during 6 minute walk test with no gait deviations or pain. ?Baseline:  ?Goal status: INITIAL ? ?4.  Patient will demonstrate at least 4 point improvement from initial BERG Balance score ?Baseline: TBD ?Goal status: INITIAL ? ? ? ? ?PLAN: ?PT FREQUENCY: 2x/week ? ?PT DURATION: 10 weeks ? ?PLANNED INTERVENTIONS: Therapeutic exercises, Therapeutic activity, Neuromuscular re-education, Balance training, Gait training, Patient/Family education, Dry Needling, Electrical stimulation, Cryotherapy, Moist heat, Vasopneumatic device, Ultrasound, Ionotophoresis '4mg'$ /ml Dexamethasone, and Manual therapy ? ?PLAN FOR NEXT SESSION: Perform Berg Balance assessment, initiate HEP for strength, balance, cardiovascular training. ? ? ?Marcelina Morel, PT ?10/16/2021, 1:18 PM  ?

## 2021-10-30 ENCOUNTER — Ambulatory Visit: Payer: Medicare Other | Admitting: Physical Therapy

## 2021-10-30 ENCOUNTER — Encounter: Payer: Self-pay | Admitting: Physical Therapy

## 2021-10-30 DIAGNOSIS — R262 Difficulty in walking, not elsewhere classified: Secondary | ICD-10-CM

## 2021-10-30 DIAGNOSIS — R2681 Unsteadiness on feet: Secondary | ICD-10-CM

## 2021-10-30 DIAGNOSIS — R293 Abnormal posture: Secondary | ICD-10-CM | POA: Diagnosis not present

## 2021-10-30 DIAGNOSIS — R278 Other lack of coordination: Secondary | ICD-10-CM | POA: Diagnosis not present

## 2021-10-30 DIAGNOSIS — M6281 Muscle weakness (generalized): Secondary | ICD-10-CM | POA: Diagnosis not present

## 2021-10-30 DIAGNOSIS — R5381 Other malaise: Secondary | ICD-10-CM | POA: Diagnosis not present

## 2021-10-30 NOTE — Therapy (Signed)
OUTPATIENT PHYSICAL THERAPY LOWER EXTREMITY TREATMENT   Patient Name: Olivia Burns MRN: [id number] DOB:[birth year], 71 y.o., female Today's Date: 10/30/2021   PT End of Session - 10/30/21 0800     Visit Number 2    Date for PT Re-Evaluation 12/25/21    PT Start Time 0800    PT Stop Time 0845    PT Time Calculation (min) 45 min    Activity Tolerance Patient tolerated treatment well    Behavior During Therapy [facility] for tasks assessed/performed             Past Medical History:  Diagnosis Date   Allergy    Arthritis    Asthma    Depression    GERD (gastroesophageal reflux disease)    Hyperlipidemia    Hypertension    Past Surgical History:  Procedure Laterality Date   ABDOMINAL HYSTERECTOMY  2001   EYE SURGERY     HEMORRHOID SURGERY  1975   MENISCUS REPAIR Left 1999   miscarriage  1977 and 1979   Patient Active Problem List   Diagnosis Date Noted   Essential hypertension 05/07/2020   Prediabetes 05/07/2020   Hyperlipidemia 05/07/2020    PCP: Nani Ravens  REFERRING PROVIDER: Shelda Pal, DO   REFERRING DIAG: R49.81 (ICD-10-CM) - Physical deconditioning   THERAPY DIAG:  Unsteadiness on feet  Muscle weakness (generalized)  Difficulty in walking, not elsewhere classified  ONSET DATE: 10/07/2021   SUBJECTIVE:   SUBJECTIVE STATEMENT:  Knee is acting up a little bit today form yoga. That's  about it other than being tired.    PERTINENT HISTORY:  See above   PAIN:  Are you having pain? 4/10 R knee   PRECAUTIONS: None  WEIGHT BEARING RESTRICTIONS No  FALLS:  Has patient fallen in last 6 months? No  LIVING ENVIRONMENT: Lives with: lives alone Lives in: House/apartment Stairs: Yes: External: 2 steps; none Has following equipment at home:  Knee brace, and None  OCCUPATION: Lets friends dogs out.  PLOF: Independent  PATIENT GOALS Be able to get back to feeling healthy and exercise. Avoid being a couch potato. Her knee had held  her back, along with multiple sinus infections. Walk her dog, dance.   OBJECTIVE:   LE MMT:  MMT Right Eval Left Eval  Hip flexion 4 4  Hip extension 4- 4-  Hip abduction 4 4  Hip adduction    Hip internal rotation    Hip external rotation    Knee flexion 4 4  Knee extension 4 4  Ankle dorsiflexion 4 4  Ankle plantarflexion    Ankle inversion    Ankle eversion     (Blank rows = not tested)    FUNCTIONAL TESTS:  Eval 5 times sit to stand: 16.84 Timed up and go (TUG): 10.34 6 minute walk test: 1000'. Steady pace, no rest breaks required.    GAIT:  Step through pattern with RLE maintained in slight ER and R knee valgus.    TODAY'S TREATMENT:    10/30/21  NuStep L3 x 6 min  Resisted gait 20lb 4 way x 3 each   Leg curls 15lb 2x10   Leg Ext 5lb 2x10  Sit to stands 2x10  Rows green 2x10  Shoulder Ext green 2x10  Heel raises black bar 2x10    10/16/21  Gait training with VC for step through and weight shift during the 6 minute walk test for 1000'   PATIENT EDUCATION:  Education details: POC  Person educated: Patient  Education method: Explanation Education comprehension: verbalized understanding   HOME EXERCISE PROGRAM: TBD  ASSESSMENT:  CLINICAL IMPRESSION: Pt enters clinic feeling well overall with some R knee pain. R knee pain was elevated post session. Pt tolerated an initial progression to TE well evident by no subjective reports of increase pain. Cues needed not to drag RLE with resisted side steps. Cues to complete the full ROM with leg curls and ext. Some fatigue with sit to stands. Reports a pull with heel raises.     OBJECTIVE IMPAIRMENTS Abnormal gait, decreased activity tolerance, decreased balance, decreased coordination, difficulty walking, decreased strength, improper body mechanics, postural dysfunction, and pain.   ACTIVITY LIMITATIONS cleaning, community activity, yard work, and shopping.   PERSONAL FACTORS Age, Fitness, Past/current  experiences, and 1 comorbidity: R knee arthritis  are also affecting patient's functional outcome.    REHAB POTENTIAL: Good  CLINICAL DECISION MAKING: Stable/uncomplicated  EVALUATION COMPLEXITY: Low   GOALS: Goals reviewed with patient? Yes  SHORT TERM GOALS: Target date: 11/13/2021  (Remove Blue Hyperlink)  Patient will be I with initial HEP Baseline: Goal status: INITIAL    LONG TERM GOALS: Target date: 12/25/2021  (Remove Blue Hyperlink)  Patient will be I final HEP Baseline:  Goal status: INITIAL  2.  Patient will perform 5x STS in < 8 sec to demonstrate improved BLE strength. Baseline:  Goal status: INITIAL  3.  Patient will ambulate at least 2000' during 6 minute walk test with no gait deviations or pain. Baseline:  Goal status: INITIAL  4.  Patient will demonstrate at least 4 point improvement from initial BERG Balance score Baseline: TBD Goal status: INITIAL     PLAN: PT FREQUENCY: 2x/week  PT DURATION: 10 weeks  PLANNED INTERVENTIONS: Therapeutic exercises, Therapeutic activity, Neuromuscular re-education, Balance training, Gait training, Patient/Family education, Dry Needling, Electrical stimulation, Cryotherapy, Moist heat, Vasopneumatic device, Ultrasound, Ionotophoresis '4mg'$ /ml Dexamethasone, and Manual therapy  PLAN FOR NEXT SESSION: Perform Berg Balance assessment, initiate HEP for strength, balance, cardiovascular training.   Scot Jun, PTA 10/30/2021, 8:00 AM

## 2021-11-01 ENCOUNTER — Ambulatory Visit: Payer: Medicare Other | Attending: Family Medicine | Admitting: Physical Therapy

## 2021-11-01 ENCOUNTER — Encounter: Payer: Self-pay | Admitting: Physical Therapy

## 2021-11-01 DIAGNOSIS — R293 Abnormal posture: Secondary | ICD-10-CM | POA: Insufficient documentation

## 2021-11-01 DIAGNOSIS — R262 Difficulty in walking, not elsewhere classified: Secondary | ICD-10-CM | POA: Diagnosis not present

## 2021-11-01 DIAGNOSIS — R278 Other lack of coordination: Secondary | ICD-10-CM | POA: Diagnosis not present

## 2021-11-01 DIAGNOSIS — R2681 Unsteadiness on feet: Secondary | ICD-10-CM | POA: Insufficient documentation

## 2021-11-01 DIAGNOSIS — M6281 Muscle weakness (generalized): Secondary | ICD-10-CM | POA: Insufficient documentation

## 2021-11-01 NOTE — Therapy (Signed)
OUTPATIENT PHYSICAL THERAPY LOWER EXTREMITY TREATMENT   Patient Name: Olivia Burns MRN: [id number] DOB:[birth year], 71 y.o., female Today's Date: 11/01/2021   PT End of Session - 11/01/21 0847     Visit Number 3    Date for PT Re-Evaluation 12/25/21    PT Start Time 0845    PT Stop Time 0930    PT Time Calculation (min) 45 min    Activity Tolerance Patient tolerated treatment well    Behavior During Therapy [facility] for tasks assessed/performed             Past Medical History:  Diagnosis Date   Allergy    Arthritis    Asthma    Depression    GERD (gastroesophageal reflux disease)    Hyperlipidemia    Hypertension    Past Surgical History:  Procedure Laterality Date   ABDOMINAL HYSTERECTOMY  2001   [place] Left 1999   miscarriage  1977 and 1979   Patient Active Problem List   Diagnosis Date Noted   Essential hypertension 05/07/2020   Prediabetes 05/07/2020   Hyperlipidemia 05/07/2020    PCP: Nani Ravens  REFERRING PROVIDER: Shelda Pal, DO   REFERRING DIAG: R39.81 (ICD-10-CM) - Physical deconditioning   THERAPY DIAG:  Unsteadiness on feet  Muscle weakness (generalized)  Difficulty in walking, not elsewhere classified  ONSET DATE: 10/07/2021   SUBJECTIVE:   SUBJECTIVE STATEMENT:  Feeling good no pain  PERTINENT HISTORY:  See above   PAIN:  Are you having pain? 0/10   PRECAUTIONS: None  WEIGHT BEARING RESTRICTIONS No  FALLS:  Has patient fallen in last 6 months? No  LIVING ENVIRONMENT: Lives with: lives alone Lives in: House/apartment Stairs: Yes: External: 2 steps; none Has following equipment at home:  Knee brace, and None  OCCUPATION: Lets friends dogs out.  PLOF: Independent  PATIENT GOALS Be able to get back to feeling healthy and exercise. Avoid being a couch potato. Her knee had held her back, along with multiple sinus infections. Walk her dog,  dance.   OBJECTIVE:   LE MMT:  MMT Right Eval Left Eval  Hip flexion 4 4  Hip extension 4- 4-  Hip abduction 4 4  Hip adduction    Hip internal rotation    Hip external rotation    Knee flexion 4 4  Knee extension 4 4  Ankle dorsiflexion 4 4  Ankle plantarflexion    Ankle inversion    Ankle eversion     (Blank rows = not tested)    FUNCTIONAL TESTS:  Eval 5 times sit to stand: 16.84 Timed up and go (TUG): 10.34 6 minute walk test: 1000'. Steady pace, no rest breaks required.    GAIT:  Step through pattern with RLE maintained in slight ER and R knee valgus.    TODAY'S TREATMENT:    11/01/21  Recumbent bike L1 x4 min   NuStep L3 x 3  4in stpe up 2x5 each   Sit to stand holding yed ball 2x10 second set OHP   Leg curls 20lb 2x10  Leg Ext 5lb 2x10   Rows Blue 2x10  Ext Blue 2x10   Heel raises 2x10 black bar   Gait 600 ft  for endurance.      10/30/21  NuStep L3 x 6 min  Resisted gait 20lb 4 way x 3 each   Leg curls 15lb 2x10   Leg Ext 5lb 2x10  Sit  to stands 2x10  Rows green 2x10  Shoulder Ext green 2x10  Heel raises black bar 2x10    10/16/21  Gait training with VC for step through and weight shift during the 6 minute walk test for 1000'   PATIENT EDUCATION:  Education details: POC  Person educated: Patient Education method: Explanation Education comprehension: verbalized understanding   HOME EXERCISE PROGRAM: TBD  ASSESSMENT:  CLINICAL IMPRESSION: Pt enters clinic feeling well overall with no pain. Pt did well with a progression of functional activities. Pt did well with step up and sit to stands, but did report some fatigue. Increase resistance tolerated with standing shoulder rows and extensions. Increase resistance also tolerated with hamstring curls. No reports of pain throughout session.    OBJECTIVE IMPAIRMENTS Abnormal gait, decreased activity tolerance, decreased balance, decreased coordination, difficulty walking, decreased  strength, improper body mechanics, postural dysfunction, and pain.   ACTIVITY LIMITATIONS cleaning, community activity, yard work, and shopping.   PERSONAL FACTORS Age, Fitness, Past/current experiences, and 1 comorbidity: R knee arthritis  are also affecting patient's functional outcome.    REHAB POTENTIAL: Good  CLINICAL DECISION MAKING: Stable/uncomplicated  EVALUATION COMPLEXITY: Low   GOALS: Goals reviewed with patient? Yes  SHORT TERM GOALS: Target date: 11/13/2021   Patient will be I with initial HEP Baseline: Goal status: INITIAL    LONG TERM GOALS: Target date: 12/25/2021   Patient will be I final HEP Baseline:  Goal status: INITIAL  2.  Patient will perform 5x STS in < 8 sec to demonstrate improved BLE strength. Baseline:  Goal status: Progressing  3.  Patient will ambulate at least 2000' during 6 minute walk test with no gait deviations or pain. Baseline:  Goal status: Progressing  4.  Patient will demonstrate at least 4 point improvement from initial BERG Balance score Baseline: TBD Goal status: INITIAL     PLAN: PT FREQUENCY: 2x/week  PT DURATION: 10 weeks  PLANNED INTERVENTIONS: Therapeutic exercises, Therapeutic activity, Neuromuscular re-education, Balance training, Gait training, Patient/Family education, Dry Needling, Electrical stimulation, Cryotherapy, Moist heat, Vasopneumatic device, Ultrasound, Ionotophoresis '4mg'$ /ml Dexamethasone, and Manual therapy  PLAN FOR NEXT SESSION: Perform Berg Balance assessment, initiate HEP for strength, balance, cardiovascular training.   Scot Jun, PTA 11/01/2021, 8:48 AM

## 2021-11-04 ENCOUNTER — Encounter: Payer: Self-pay | Admitting: Physical Therapy

## 2021-11-04 ENCOUNTER — Ambulatory Visit: Payer: Medicare Other | Admitting: Physical Therapy

## 2021-11-04 DIAGNOSIS — R278 Other lack of coordination: Secondary | ICD-10-CM | POA: Diagnosis not present

## 2021-11-04 DIAGNOSIS — R293 Abnormal posture: Secondary | ICD-10-CM

## 2021-11-04 DIAGNOSIS — M6281 Muscle weakness (generalized): Secondary | ICD-10-CM

## 2021-11-04 DIAGNOSIS — R262 Difficulty in walking, not elsewhere classified: Secondary | ICD-10-CM

## 2021-11-04 DIAGNOSIS — R2681 Unsteadiness on feet: Secondary | ICD-10-CM

## 2021-11-04 NOTE — Therapy (Signed)
OUTPATIENT PHYSICAL THERAPY LOWER EXTREMITY TREATMENT   Patient Name: Olivia Burns MRN: [id number] DOB:14-Aug-1950, 71 y.o., female Today's Date: 11/04/2021   PT End of Session - 11/04/21 0952     Visit Number 4    Date for PT Re-Evaluation 12/25/21    PT Start Time 0930    PT Stop Time 1013    PT Time Calculation (min) 43 min    Activity Tolerance Patient tolerated treatment well    Behavior During Therapy [facility] for tasks assessed/performed              Past Medical History:  Diagnosis Date   Allergy    Arthritis    Asthma    Depression    GERD (gastroesophageal reflux disease)    Hyperlipidemia    Hypertension    Past Surgical History:  Procedure Laterality Date   ABDOMINAL HYSTERECTOMY  2001   [place] Left 1999   miscarriage  1977 and 1979   Patient Active Problem List   Diagnosis Date Noted   Essential hypertension 05/07/2020   Prediabetes 05/07/2020   Hyperlipidemia 05/07/2020    PCP: Nani Ravens  REFERRING PROVIDER: Shelda Pal, DO   REFERRING DIAG: R69.81 (ICD-10-CM) - Physical deconditioning   THERAPY DIAG:  Unsteadiness on feet  Muscle weakness (generalized)  Difficulty in walking, not elsewhere classified  Other lack of coordination  Abnormal posture  ONSET DATE: 10/07/2021   SUBJECTIVE:   SUBJECTIVE STATEMENT:  Patient reports no pain  PERTINENT HISTORY:  See above   PAIN:  Are you having pain? 0/10   PRECAUTIONS: None  WEIGHT BEARING RESTRICTIONS No  FALLS:  Has patient fallen in last 6 months? No  LIVING ENVIRONMENT: Lives with: lives alone Lives in: House/apartment Stairs: Yes: External: 2 steps; none Has following equipment at home:  Knee brace, and None  OCCUPATION: Lets friends dogs out.  PLOF: Independent  PATIENT GOALS Be able to get back to feeling healthy and exercise. Avoid being a couch potato. Her knee had held her back, along with  multiple sinus infections. Walk her dog, dance.   OBJECTIVE:   LE MMT:  MMT Right Eval Left Eval  Hip flexion 4 4  Hip extension 4- 4-  Hip abduction 4 4  Hip adduction    Hip internal rotation    Hip external rotation    Knee flexion 4 4  Knee extension 4 4  Ankle dorsiflexion 4 4  Ankle plantarflexion    Ankle inversion    Ankle eversion     (Blank rows = not tested)    FUNCTIONAL TESTS:  Eval 5 times sit to stand: 16.84 Timed up and go (TUG): 10.34 6 minute walk test: 1000'. Steady pace, no rest breaks required.    GAIT:  Step through pattern with RLE maintained in slight ER and R knee valgus.    TODAY'S TREATMENT:   11/04/21 NuStep L5 x 5 minutes Shoulder rows, ext, ER 2 x 10 reps with blue Tband resistance Mini squats, 2 x 10 reps B side steps against red T band resistance, 2 x 10 Heel raises on step, 2 x 10    11/01/21  Recumbent bike L1 x4 min   NuStep L3 x 3  4in stpe up 2x5 each   Sit to stand holding yed ball 2x10 second set OHP   Leg curls 20lb 2x10  Leg Ext 5lb 2x10   Rows Blue 2x10  Ext Blue 2x10   Heel raises 2x10 black bar   Gait 600 ft  for endurance.      10/30/21  NuStep L3 x 6 min  Resisted gait 20lb 4 way x 3 each   Leg curls 15lb 2x10   Leg Ext 5lb 2x10  Sit to stands 2x10  Rows green 2x10  Shoulder Ext green 2x10  Heel raises black bar 2x10    10/16/21  Gait training with VC for step through and weight shift during the 6 minute walk test for 1000'   PATIENT EDUCATION:  Education details: HEP  Person educated: Patient Education method: Explanation, Demo, Handout Education comprehension: verbalized understanding   HOME EXERCISE PROGRAM:  Access Code: [facility] URL: [email or web address] Date: 11/04/2021 Prepared by: Ethel Rana  Exercises - Shoulder Extension with Resistance  - 1 x daily - 7 x weekly - 2 sets - 10 reps - Standing Bilateral Low Shoulder Row with Anchored Resistance  - 1 x daily - 7 x  weekly - 2 sets - 10 reps - Shoulder External Rotation and Scapular Retraction with Resistance  - 1 x daily - 7 x weekly - 2 sets - 10 reps - Mini Squat with Chair  - 1 x daily - 7 x weekly - 2 sets - 10 reps - Side Stepping with Resistance at Thighs  - 1 x daily - 7 x weekly - 2 sets - 10 reps - Standing Bilateral Heel Raise on Step  - 1 x daily - 7 x weekly - 2 sets - 10 reps   ASSESSMENT:  CLINICAL IMPRESSION: Patient reports improved strength in legs, decreased knee pain. Initiated HEP for general strength and conditioning, enouraged patient to also walk in neighborhood.   OBJECTIVE IMPAIRMENTS Abnormal gait, decreased activity tolerance, decreased balance, decreased coordination, difficulty walking, decreased strength, improper body mechanics, postural dysfunction, and pain.   ACTIVITY LIMITATIONS cleaning, community activity, yard work, and shopping.   PERSONAL FACTORS Age, Fitness, Past/current experiences, and 1 comorbidity: R knee arthritis  are also affecting patient's functional outcome.    REHAB POTENTIAL: Good  CLINICAL DECISION MAKING: Stable/uncomplicated  EVALUATION COMPLEXITY: Low   GOALS: Goals reviewed with patient? Yes  SHORT TERM GOALS: Target date: 11/13/2021   Patient will be I with initial HEP Baseline: Goal status: Initaited    LONG TERM GOALS: Target date: 12/25/2021   Patient will be I final HEP Baseline:  Goal status: INITIAL  2.  Patient will perform 5x STS in < 8 sec to demonstrate improved BLE strength. Baseline:  Goal status: Progressing  3.  Patient will ambulate at least 2000' during 6 minute walk test with no gait deviations or pain. Baseline:  Goal status: Progressing  4.  Patient will demonstrate at least 4 point improvement from initial BERG Balance score Baseline: TBD Goal status: patient scored 56/56, achieved     PLAN: PT FREQUENCY: 2x/week  PT DURATION: 10 weeks  PLANNED INTERVENTIONS: Therapeutic exercises,  Therapeutic activity, Neuromuscular re-education, Balance training, Gait training, Patient/Family education, Dry Needling, Electrical stimulation, Cryotherapy, Moist heat, Vasopneumatic device, Ultrasound, Ionotophoresis '4mg'$ /ml Dexamethasone, and Manual therapy  PLAN FOR NEXT SESSION: Progressive strenght, assess HEP tolerance.   Marcelina Morel, DPT 11/04/2021, 10:16 AM

## 2021-11-04 NOTE — Progress Notes (Unsigned)
Synopsis: Referred for excessive daytime sleepiness by Shelda Pal*  Subjective:   PATIENT ID: Olivia Burns GENDER: female DOB: 1951-04-06, MRN: [id number]  No chief complaint on file.  71yF with history of asthma - sounds mild intermittent and uses albuterol inhaler as needed, allergy, GERD, HTN, depression seen for excessive daytime sleepiness with concern for OSA  Seen recently by PCP with possible viral URI but concern as well for OSA.  She has been feeling sleepy for a while since at least last year. She snores loudly - wakes herself up. No PND. Very sleepy during the day. She does have some RLS sensation. She does do some sleep-talking. She doesn't think she sleep walks or gets up and eats or does anything else unusual at night. She has never had seizures before.   She has never had sleep study before.   She may take melatonin on occasion.   No family history of lung disease.   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 She has never lived outside of [place]. She smoked only every now and then. 2 packs in her lifetime.   Interval HPI:  HST with mild OSA AHI 9.5 - supine throughout.   Ferritin only 39.5.   Otherwise pertinent review of systems is negative.    Past Medical History:  Diagnosis Date   Allergy    Arthritis    Asthma    Depression    GERD (gastroesophageal reflux disease)    Hyperlipidemia    Hypertension      Family History  Problem Relation Age of Onset   Arthritis Mother    Asthma Mother    Depression Mother    Diabetes Mother    Heart disease Mother    Kidney disease Mother    Arthritis Father    Depression Father    Hyperlipidemia Father    Hypertension Father    Cancer Father        prostate   Cancer Sister        gastric vs pancreatic   Drug abuse Brother    Early death Brother      Past Surgical History:  Procedure Laterality Date   ABDOMINAL HYSTERECTOMY  2001   EYE SURGERY     HEMORRHOID SURGERY  1975   MENISCUS  REPA[place] Left 1999   miscarriage  86 and 1979    Social History   Socioeconomic History   Marital status: Single    Spouse name: Not on file   Number of children: Not on file   Years of education: Not on file   Highest education level: Not on file  Occupational History   Not on file  Tobacco Use   Smoking status: Former    Types: Cigarettes   Smokeless tobacco: Never   Tobacco comments:    "Tried smoking"   Vaping Use   Vaping Use: Never used  Substance and Sexual Activity   Alcohol use: Yes    Comment: occasional   Drug use: Never   Sexual activity: Not on file  Other Topics Concern   Not on file  Social History Narrative   Not on file   Social Determinants of Health   Financial Resource Strain: Low Risk    Difficulty of Paying Living Expenses: Not hard at all  Food Insecurity: No Food Insecurity   Worried About Charity fundraiser in the Last Year: Never true   [place] in the Last Year: Never true  Transportation  Needs: No Transportation Needs   Lack of Transportation (Medical): No   Lack of Transportation (Non-Medical): No  Physical Activity: Sufficiently Active   Days of Exercise per Week: 7 days   Minutes of Exercise per Session: 30 min  Stress: No Stress Concern Present   Feeling of Stress : Only a little  Social Connections: Socially Isolated   Frequency of Communication with Friends and Family: More than three times a week   Frequency of Social Gatherings with Friends and Family: More than three times a week   Attends Religious Services: Never   Marine scientist or Organizations: No   Attends Music therapist: Never   Marital Status: Never married  Human resources officer Violence: Not At Risk   Fear of Current or Ex-Partner: No   Emotionally Abused: No   Physically Abused: No   Sexually Abused: No     Allergies  Allergen Reactions   Codeine Other (See Comments)    Problems breathing   Penicillins Itching   Latex Rash      Outpatient Medications Prior to Visit  Medication Sig Dispense Refill   Ascorbic Acid (VITAMIN C-ROSE HIPS-ACEROLA PO) Take 1 capsule by mouth daily.     diclofenac Sodium (VOLTAREN) 1 % GEL Apply 2 g topically 4 (four) times daily.     linaclotide (LINZESS) 145 MCG CAPS capsule Take 145 mcg by mouth daily before breakfast.     MAGNESIUM GLYCINATE PO Take 400 capsules by mouth daily.     Multiple Vitamins-Minerals (CENTRUM SILVER 50+WOMEN) TABS Take 1 tablet by mouth daily.     omeprazole (PRILOSEC) 20 MG capsule Take 40 mg by mouth daily.     TURMERIC PO Take 1,000 mg by mouth daily.     valsartan-hydrochlorothiazide (DIOVAN-HCT) 80-12.5 MG tablet TAKE 1 TABLET BY MOUTH EVERY DAY 90 tablet 1   No facility-administered medications prior to visit.       Objective:   Physical Exam:  General appearance: 71 y.o., female, NAD, conversant, female, NAD, conversant  Eyes: anicteric sclerae; PERRL, tracking appropriately HENT: NCAT; MMM, mallampati 3 Neck: Trachea midline; no lymphadenopathy, no JVD Lungs: CTAB, no crackles, no wheeze, with normal respiratory effort CV: RRR, no murmur  Abdomen: Soft, non-tender; non-distended, BS present  Extremities: No peripheral edema, warm Skin: Normal turgor and texture; no rash Psych: Appropriate affect Neuro: Alert and oriented to person and place, no focal deficit     There were no vitals filed for this visit.    on RA BMI Readings from Last 3 Encounters:  10/07/21 42.48 kg/m  07/26/21 42.36 kg/m  07/09/21 42.21 kg/m   Wt Readings from Last 3 Encounters:  10/07/21 228 lb 8 oz (103.6 kg)  07/26/21 224 lb 3.2 oz (101.7 kg)  07/09/21 223 lb 6 oz (101.3 kg)     CBC No results found for: WBC, RBC, HGB, HCT, PLT, MCV, MCH, MCHC, RDW, LYMPHSABS, MONOABS, EOSABS, BASOSABS  Bicarb 33-34  Chest Imaging: None available  Pulmonary Functions Testing Results:     View : No data to display.           Echocardiogram:  None available     Assessment  & Plan:   # Excessive daytime sleepiness # Snoring Elevated bicarb may be effect of hctz, alternatively could reflect OHS. We'll start with seeing if she has OSA, trial treatment with CPAP or autoPAP if present and then follow serum bicarb, clinical response to PAP before consideration of workup for BiPAP, etc.  # Restless  legs  # Severe obesity  Plan: - home sleep study - check ferritin - TLC measures for weight loss discussed with target weight loss 10%. Referred to medical weight loss clinic.  RTC 4 months   Maryjane Hurter, MD [facility] 11/04/2021 4:08 PM

## 2021-11-06 ENCOUNTER — Encounter: Payer: Self-pay | Admitting: Student

## 2021-11-06 ENCOUNTER — Ambulatory Visit (INDEPENDENT_AMBULATORY_CARE_PROVIDER_SITE_OTHER): Payer: Medicare Other | Admitting: Student

## 2021-11-06 VITALS — BP 118/70 | HR 65 | Temp 98.1°F | Ht 61.0 in | Wt 228.0 lb

## 2021-11-06 DIAGNOSIS — G2581 Restless legs syndrome: Secondary | ICD-10-CM

## 2021-11-06 DIAGNOSIS — E611 Iron deficiency: Secondary | ICD-10-CM

## 2021-11-06 DIAGNOSIS — R7303 Prediabetes: Secondary | ICD-10-CM | POA: Diagnosis not present

## 2021-11-06 DIAGNOSIS — R5382 Chronic fatigue, unspecified: Secondary | ICD-10-CM

## 2021-11-06 MED ORDER — FERROUS GLUCONATE 324 (38 FE) MG PO TABS: 324.0000 mg | ORAL_TABLET | Freq: Every day | ORAL | 1 refills | Status: DC

## 2021-11-06 NOTE — Patient Instructions (Addendum)
-   start ferrous gluconate 1 tablet daily  - goal weight loss 10% body weight - body pillow to maintain side sleeping position - see you in 3 months!

## 2021-11-07 ENCOUNTER — Ambulatory Visit: Payer: Medicare Other | Admitting: Physical Therapy

## 2021-11-07 ENCOUNTER — Encounter: Payer: Self-pay | Admitting: Physical Therapy

## 2021-11-07 DIAGNOSIS — R278 Other lack of coordination: Secondary | ICD-10-CM | POA: Diagnosis not present

## 2021-11-07 DIAGNOSIS — R293 Abnormal posture: Secondary | ICD-10-CM | POA: Diagnosis not present

## 2021-11-07 DIAGNOSIS — R262 Difficulty in walking, not elsewhere classified: Secondary | ICD-10-CM

## 2021-11-07 DIAGNOSIS — M6281 Muscle weakness (generalized): Secondary | ICD-10-CM

## 2021-11-07 DIAGNOSIS — R2681 Unsteadiness on feet: Secondary | ICD-10-CM | POA: Diagnosis not present

## 2021-11-07 NOTE — Therapy (Signed)
OUTPATIENT PHYSICAL THERAPY LOWER EXTREMITY TREATMENT   Patient Name: Olivia Burns MRN: [id number] DOB:10-08-1950, 71 y.o., female Today's Date: 11/07/2021   PT End of Session - 11/07/21 0930     Visit Number 5    Date for PT Re-Evaluation 12/25/21    PT Start Time 0930    PT Stop Time 1941    PT Time Calculation (min) 45 min    Activity Tolerance Patient tolerated treatment well    Behavior During Therapy [facility] for tasks assessed/performed              Past Medical History:  Diagnosis Date   Allergy    Arthritis    Asthma    Depression    GERD (gastroesophageal reflux disease)    Hyperlipidemia    Hypertension    Past Surgical History:  Procedure Laterality Date   ABDOMINAL HYSTERECTOMY  2001   EYE SURGERY     HEMORRHOID SURGERY  1975   MENISCUS REPAIR Left 1999   miscarriage  1977 and 1979   Patient Active Problem List   Diagnosis Date Noted   Essential hypertension 05/07/2020   Prediabetes 05/07/2020   Hyperlipidemia 05/07/2020    PCP: Nani Ravens  REFERRING PROVIDER: Shelda Pal, DO   REFERRING DIAG: R22.81 (ICD-10-CM) - Physical deconditioning   THERAPY DIAG:  Unsteadiness on feet  Muscle weakness (generalized)  Difficulty in walking, not elsewhere classified  ONSET DATE: 10/07/2021   SUBJECTIVE:   SUBJECTIVE STATEMENT:  Pt reports some bilateral knee throbbing the day after last session. Some improvement the following day.    PAIN:  Are you having pain? 0/10    PRECAUTIONS: None  WEIGHT BEARING RESTRICTIONS No  FALLS:  Has patient fallen in last 6 months? No  LIVING ENVIRONMENT: Lives with: lives alone Lives in: House/apartment Stairs: Yes: External: 2 steps; none Has following equipment at home:  Knee brace, and None  OCCUPATION: Lets friends dogs out.  PLOF: Independent  PATIENT GOALS Be able to get back to feeling healthy and exercise. Avoid being a couch potato. Her knee had held her back, along with  multiple sinus infections. Walk her dog, dance.   OBJECTIVE:   LE MMT:  MMT Right Eval Left Eval  Hip flexion 4 4  Hip extension 4- 4-  Hip abduction 4 4  Hip adduction    Hip internal rotation    Hip external rotation    Knee flexion 4 4  Knee extension 4 4  Ankle dorsiflexion 4 4  Ankle plantarflexion    Ankle inversion    Ankle eversion     (Blank rows = not tested)    FUNCTIONAL TESTS:  Eval 5 times sit to stand: 16.84 Timed up and go (TUG): 10.34 6 minute walk test: 1000'. Steady pace, no rest breaks required.    GAIT:  Step through pattern with RLE maintained in slight ER and R knee valgus.    TODAY'S TREATMENT:   11/07/2021 NuStep L5 x 7 min MT- PROM bilateral knees  Seated HS curls red   LAQ 2lb 2x10  Sit to stands 3x5 Standing marches 2x10  Gait 344f for endurance, slight limp  Ball Squeezes 2x10   11/04/21 NuStep L5 x 5 minutes Shoulder rows, ext, ER 2 x 10 reps with blue Tband resistance Mini squats, 2 x 10 reps B side steps against red T band resistance, 2 x 10 Heel raises on step, 2 x 10    11/01/21  Recumbent bike L1 x4 min  NuStep L3 x 3  4in stpe up 2x5 each   Sit to stand holding yed ball 2x10 second set OHP   Leg curls 20lb 2x10  Leg Ext 5lb 2x10   Rows Blue 2x10  Ext Blue 2x10   Heel raises 2x10 black bar   Gait 600 ft  for endurance.      10/30/21  NuStep L3 x 6 min  Resisted gait 20lb 4 way x 3 each   Leg curls 15lb 2x10   Leg Ext 5lb 2x10  Sit to stands 2x10  Rows green 2x10  Shoulder Ext green 2x10  Heel raises black bar 2x10    10/16/21  Gait training with VC for step through and weight shift during the 6 minute walk test for 1000'   PATIENT EDUCATION:  Education details: HEP  Person educated: Patient Education method: Explanation, Demo, Handout Education comprehension: verbalized understanding   HOME EXERCISE PROGRAM:  Access Code: [facility] URL: [email or web address] Date:  11/04/2021 Prepared by: Ethel Rana  Exercises - Shoulder Extension with Resistance  - 1 x daily - 7 x weekly - 2 sets - 10 reps - Standing Bilateral Low Shoulder Row with Anchored Resistance  - 1 x daily - 7 x weekly - 2 sets - 10 reps - Shoulder External Rotation and Scapular Retraction with Resistance  - 1 x daily - 7 x weekly - 2 sets - 10 reps - Mini Squat with Chair  - 1 x daily - 7 x weekly - 2 sets - 10 reps - Side Stepping with Resistance at Thighs  - 1 x daily - 7 x weekly - 2 sets - 10 reps - Standing Bilateral Heel Raise on Step  - 1 x daily - 7 x weekly - 2 sets - 10 reps   ASSESSMENT:  CLINICAL IMPRESSION: Pt enters limping reporting some bilateral knee aching L>R. Session was back down due to subjective reports. Some pain in the L knee with passive knee flexion. Aching did decrease after MT and seated LE exercises. Limp was still present during gait trial but was less compared to limp entering clinic.   OBJECTIVE IMPAIRMENTS Abnormal gait, decreased activity tolerance, decreased balance, decreased coordination, difficulty walking, decreased strength, improper body mechanics, postural dysfunction, and pain.   ACTIVITY LIMITATIONS cleaning, community activity, yard work, and shopping.   PERSONAL FACTORS Age, Fitness, Past/current experiences, and 1 comorbidity: R knee arthritis  are also affecting patient's functional outcome.    REHAB POTENTIAL: Good  CLINICAL DECISION MAKING: Stable/uncomplicated  EVALUATION COMPLEXITY: Low   GOALS: Goals reviewed with patient? Yes  SHORT TERM GOALS: Target date: 11/13/2021   Patient will be I with initial HEP Baseline: Goal status: Initaited    LONG TERM GOALS: Target date: 12/25/2021   Patient will be I final HEP Baseline:  Goal status: INITIAL  2.  Patient will perform 5x STS in < 8 sec to demonstrate improved BLE strength. Baseline:  Goal status: Progressing  3.  Patient will ambulate at least 2000' during 6 minute  walk test with no gait deviations or pain. Baseline:  Goal status: Progressing  4.  Patient will demonstrate at least 4 point improvement from initial BERG Balance score Baseline: TBD Goal status: patient scored 56/56, achieved     PLAN: PT FREQUENCY: 2x/week  PT DURATION: 10 weeks  PLANNED INTERVENTIONS: Therapeutic exercises, Therapeutic activity, Neuromuscular re-education, Balance training, Gait training, Patient/Family education, Dry Needling, Electrical stimulation, Cryotherapy, Moist heat, Vasopneumatic device, Ultrasound, Ionotophoresis '4mg'$ /ml Dexamethasone, and Manual therapy  PLAN FOR NEXT SESSION: Progressive strenght, assess HEP tolerance.   Marcelina Morel, DPT 11/07/2021, 9:30 AM

## 2021-11-11 ENCOUNTER — Encounter: Payer: Self-pay | Admitting: Physical Therapy

## 2021-11-11 ENCOUNTER — Ambulatory Visit: Payer: Medicare Other | Admitting: Physical Therapy

## 2021-11-11 DIAGNOSIS — M6281 Muscle weakness (generalized): Secondary | ICD-10-CM

## 2021-11-11 DIAGNOSIS — R278 Other lack of coordination: Secondary | ICD-10-CM

## 2021-11-11 DIAGNOSIS — R2681 Unsteadiness on feet: Secondary | ICD-10-CM | POA: Diagnosis not present

## 2021-11-11 DIAGNOSIS — R293 Abnormal posture: Secondary | ICD-10-CM

## 2021-11-11 DIAGNOSIS — R262 Difficulty in walking, not elsewhere classified: Secondary | ICD-10-CM | POA: Diagnosis not present

## 2021-11-11 NOTE — Therapy (Signed)
OUTPATIENT PHYSICAL THERAPY LOWER EXTREMITY TREATMENT   Patient Name: Olivia Burns MRN: [id number] DOB:July 13, 1950, 71 y.o., female Today's Date: 11/11/2021   PT End of Session - 11/11/21 0942     Visit Number 6    Date for PT Re-Evaluation 12/25/21    PT Start Time 0927    PT Stop Time 1012    PT Time Calculation (min) 45 min    Activity Tolerance Patient tolerated treatment well    Behavior During Therapy [facility] for tasks assessed/performed               Past Medical History:  Diagnosis Date   Allergy    Arthritis    Asthma    Depression    GERD (gastroesophageal reflux disease)    Hyperlipidemia    Hypertension    Past Surgical History:  Procedure Laterality Date   ABDOMINAL HYSTERECTOMY  2001   [place] Left 1999   miscarriage  1977 and 1979   Patient Active Problem List   Diagnosis Date Noted   Essential hypertension 05/07/2020   Prediabetes 05/07/2020   Hyperlipidemia 05/07/2020    PCP: Nani Ravens  REFERRING PROVIDER: Shelda Pal, DO   REFERRING DIAG: R76.81 (ICD-10-CM) - Physical deconditioning   THERAPY DIAG:  Unsteadiness on feet  Muscle weakness (generalized)  Other lack of coordination  Abnormal posture  ONSET DATE: 10/07/2021   SUBJECTIVE:   SUBJECTIVE STATEMENT:  Patient reports her knees are continuing to bother her, definitely impacted by weather changes. She feels the squats may be aggravating the pain as well.    PAIN:  Are you having pain? 3/10  "It lets me know Im uncomfortable, but I can get past it."   PRECAUTIONS: None  WEIGHT BEARING RESTRICTIONS No  FALLS:  Has patient fallen in last 6 months? No  LIVING ENVIRONMENT: Lives with: lives alone Lives in: House/apartment Stairs: Yes: External: 2 steps; none Has following equipment at home:  Knee brace, and None  OCCUPATION: Lets friends dogs out.  PLOF: Independent  PATIENT GOALS Be able to  get back to feeling healthy and exercise. Avoid being a couch potato. Her knee had held her back, along with multiple sinus infections. Walk her dog, dance.   OBJECTIVE:   LE MMT:  MMT Right Eval Left Eval  Hip flexion 4 4  Hip extension 4- 4-  Hip abduction 4 4  Hip adduction    Hip internal rotation    Hip external rotation    Knee flexion 4 4  Knee extension 4 4  Ankle dorsiflexion 4 4  Ankle plantarflexion    Ankle inversion    Ankle eversion     (Blank rows = not tested)    FUNCTIONAL TESTS:  Eval 5 times sit to stand: 16.84 Timed up and go (TUG): 10.34 6 minute walk test: 1000'. Steady pace, no rest breaks required.    GAIT:  Step through pattern with RLE maintained in slight ER and R knee valgus.    TODAY'S TREATMENT:   11/11/21 NuStep, L5 x 6 minutes. Supine SLR, SLR with ER, 2 x 10 reps each, B. Bridge over ball, 10 reps, bridge and roll ball side to side x 3, 5 reps Roll knees to chest on ball x 10 Bridge with hip abd against Red Tband 5 reps Standing side steps against red Tband resistance, 2 x 10  11/07/2021 NuStep L5 x 7 min MT- PROM bilateral  knees  Seated HS curls red   LAQ 2lb 2x10  Sit to stands 3x5 Standing marches 2x10  Gait 363f for endurance, slight limp  Ball Squeezes 2x10   11/04/21 NuStep L5 x 5 minutes Shoulder rows, ext, ER 2 x 10 reps with blue Tband resistance Mini squats, 2 x 10 reps B side steps against red T band resistance, 2 x 10 Heel raises on step, 2 x 10    11/01/21  Recumbent bike L1 x4 min   NuStep L3 x 3  4in stpe up 2x5 each   Sit to stand holding yed ball 2x10 second set OHP   Leg curls 20lb 2x10  Leg Ext 5lb 2x10   Rows Blue 2x10  Ext Blue 2x10   Heel raises 2x10 black bar   Gait 600 ft  for endurance.      10/30/21  NuStep L3 x 6 min  Resisted gait 20lb 4 way x 3 each   Leg curls 15lb 2x10   Leg Ext 5lb 2x10  Sit to stands 2x10  Rows green 2x10  Shoulder Ext green 2x10  Heel raises black bar  2x10    10/16/21  Gait training with VC for step through and weight shift during the 6 minute walk test for 1000'   PATIENT EDUCATION:  Education details: HEP  Person educated: Patient Education method: Explanation, Demo, Handout Education comprehension: verbalized understanding   [facility] EXERCISE PROGRAM:  [email or web address] [email or web address] Date: 11/11/2021 Prepared by: SEthel Rana Exercises - Shoulder Extension with Resistance  - 1 x daily - 7 x weekly - 2 sets - 10 reps - Standing Bilateral Low Shoulder Row with Anchored Resistance  - 1 x daily - 7 x weekly - 2 sets - 10 reps - Shoulder External Rotation and Scapular Retraction with Resistance  - 1 x daily - 7 x weekly - 2 sets - 10 reps - Side Stepping with Resistance at Thighs  - 1 x daily - 7 x weekly - 2 sets - 10 reps - Standing Bilateral Heel Raise on Step  - 1 x daily - 7 x weekly - 2 sets - 10 reps - Supine Knees to Chest with Swiss Ball  - 1 x daily - 7 x weekly - 2 sets - 10 reps - Bridge with Arms at [employer] Fe[employer] on [employer] - 1 x daily - 7 x weekly - 2 sets - 10 reps - Supine Bridge with Resistance Band  - 1 x daily - 7 x weekly - 2 sets - 10 reps - Supine Straight Leg Raises  - 1 x daily - 7 x weekly - 2 sets - 10 reps - Straight Leg Raise with External Rotation  - 1 x daily - 7 x weekly - 2 sets - 10 reps ASSESSMENT:  CLINICAL IMPRESSION: Patient reports B knee pain, appears to fluctuate with weather changes, but also aggravated by squats. Modified HEP and paitent performed without C/O pain today.    OBJECTIVE IMPAIRMENTS Abnormal gait, decreased activity tolerance, decreased balance, decreased coordination, difficulty walking, decreased strength, improper body mechanics, postural dysfunction, and pain.   ACTIVITY LIMITATIONS cleaning, community activity, yard work, and shopping.   PERSONAL FACTORS Age, Fitness, Past/current experiences, and 1 comorbidity: R knee arthritis  are  also affecting patient's functional outcome.    REHAB POTENTIAL: Good  CLINICAL DECISION MAKING: Stable/uncomplicated  EVALUATION COMPLEXITY: Low   GOALS: Goals reviewed with patient? Yes  SHORT TERM GOALS: Target date:  11/13/2021   Patient will be I with initial HEP Baseline: Goal status: Initaited    LONG TERM GOALS: Target date: 12/25/2021   Patient will be I final HEP Baseline:  Goal status: INITIAL  2.  Patient will perform 5x STS in < 8 sec to demonstrate improved BLE strength. Baseline:  Goal status: Progressing  3.  Patient will ambulate at least 2000' during 6 minute walk test with no gait deviations or pain. Baseline:  Goal status: Progressing  4.  Patient will demonstrate at least 4 point improvement from initial BERG Balance score Baseline: TBD Goal status: patient scored 56/56, achieved     PLAN: PT FREQUENCY: 2x/week  PT DURATION: 10 weeks  PLANNED INTERVENTIONS: Therapeutic exercises, Therapeutic activity, Neuromuscular re-education, Balance training, Gait training, Patient/Family education, Dry Needling, Electrical stimulation, Cryotherapy, Moist heat, Vasopneumatic device, Ultrasound, Ionotophoresis '4mg'$ /ml Dexamethasone, and Manual therapy  PLAN FOR NEXT SESSION: ...   Marcelina Morel, DPT 11/11/2021, 10:17 AM

## 2021-11-14 ENCOUNTER — Ambulatory Visit: Payer: Medicare Other | Admitting: Physical Therapy

## 2021-11-14 ENCOUNTER — Encounter: Payer: Self-pay | Admitting: Physical Therapy

## 2021-11-14 DIAGNOSIS — M6281 Muscle weakness (generalized): Secondary | ICD-10-CM | POA: Diagnosis not present

## 2021-11-14 DIAGNOSIS — R2681 Unsteadiness on feet: Secondary | ICD-10-CM | POA: Diagnosis not present

## 2021-11-14 DIAGNOSIS — R278 Other lack of coordination: Secondary | ICD-10-CM | POA: Diagnosis not present

## 2021-11-14 DIAGNOSIS — R293 Abnormal posture: Secondary | ICD-10-CM | POA: Diagnosis not present

## 2021-11-14 DIAGNOSIS — R262 Difficulty in walking, not elsewhere classified: Secondary | ICD-10-CM | POA: Diagnosis not present

## 2021-11-14 NOTE — Therapy (Signed)
OUTPATIENT PHYSICAL THERAPY LOWER EXTREMITY TREATMENT   Patient Name: Olivia Burns MRN: [id number] DOB:[birth year], 71 y.o., female Today's Date: 11/14/2021   PT End of Session - 11/14/21 0941     Visit Number 7    Date for PT Re-Evaluation 12/25/21    PT Start Time 0928    PT Stop Time 1011    PT Time Calculation (min) 43 min    Activity Tolerance Patient tolerated treatment well    Behavior During Therapy [facility] for tasks assessed/performed                Past Medical History:  Diagnosis Date   Allergy    Arthritis    Asthma    Depression    GERD (gastroesophageal reflux disease)    Hyperlipidemia    Hypertension    Past Surgical History:  Procedure Laterality Date   ABDOMINAL HYSTERECTOMY  2001   [place] Left 1999   miscarriage  1977 and 1979   Patient Active Problem List   Diagnosis Date Noted   Essential hypertension 05/07/2020   Prediabetes 05/07/2020   Hyperlipidemia 05/07/2020    PCP: Nani Ravens  REFERRING PROVIDER: Shelda Pal, DO   REFERRING DIAG: R49.81 (ICD-10-CM) - Physical deconditioning   THERAPY DIAG:  Unsteadiness on feet  Muscle weakness (generalized)  Other lack of coordination  Abnormal posture  Difficulty in walking, not elsewhere classified  ONSET DATE: 10/07/2021   SUBJECTIVE:   SUBJECTIVE STATEMENT:  Patient reports her R knee was sore this morning, but she was able to work the pain out with some walking and rubbing it with cream. She has not performed Hep due to feeling ill after starting iron supplements.    PAIN:  Are you having pain? 0/10    PRECAUTIONS: None  WEIGHT BEARING RESTRICTIONS No  FALLS:  Has patient fallen in last 6 months? No  LIVING ENVIRONMENT: Lives with: lives alone Lives in: House/apartment Stairs: Yes: External: 2 steps; none Has following equipment at home:  Knee brace, and None  OCCUPATION: Lets friends dogs  out.  PLOF: Independent  PATIENT GOALS Be able to get back to feeling healthy and exercise. Avoid being a couch potato. Her knee had held her back, along with multiple sinus infections. Walk her dog, dance.   OBJECTIVE:   LE MMT:  MMT Right Eval Left Eval  Hip flexion 4 4  Hip extension 4- 4-  Hip abduction 4 4  Hip adduction    Hip internal rotation    Hip external rotation    Knee flexion 4 4  Knee extension 4 4  Ankle dorsiflexion 4 4  Ankle plantarflexion    Ankle inversion    Ankle eversion     (Blank rows = not tested)    FUNCTIONAL TESTS:  Eval 5 times sit to stand: 16.84 Timed up and go (TUG): 10.34 6 minute walk test: 1000'. Steady pace, no rest breaks required.    GAIT:  Step through pattern with RLE maintained in slight ER and R knee valgus.    TODAY'S TREATMENT:  11/14/21 Recumbent Bike L4 x 6 minutes Leg curls 2 x 10 reps 20# Leg extension 2 x 10 reps 10# Standing hip abduction against R Tband, 2 x10 reps Lat pulls 2 x 10 reps 25# Trunk extension with Black Tband, 2 x 10 reps Seated rows 2 x 10 reps, 15# Seated chest press 2 x 10 15#  11/11/21 NuStep, L5 x 6 minutes. Supine SLR, SLR with ER, 2 x 10 reps each, B. Bridge over ball, 10 reps, bridge and roll ball side to side x 3, 5 reps Roll knees to chest on ball x 10 Bridge with hip abd against Red Tband 5 reps Standing side steps against red Tband resistance, 2 x 10  11/07/2021 NuStep L5 x 7 min MT- PROM bilateral knees  Seated HS curls red   LAQ 2lb 2x10  Sit to stands 3x5 Standing marches 2x10  Gait 353f for endurance, slight limp  Ball Squeezes 2x10   11/04/21 NuStep L5 x 5 minutes Shoulder rows, ext, ER 2 x 10 reps with blue Tband resistance Mini squats, 2 x 10 reps B side steps against red T band resistance, 2 x 10 Heel raises on step, 2 x 10    11/01/21  Recumbent bike L1 x4 min   NuStep L3 x 3  4in stpe up 2x5 each   Sit to stand holding yed ball 2x10 second set OHP    Leg curls 20lb 2x10  Leg Ext 5lb 2x10   Rows Blue 2x10  Ext Blue 2x10   Heel raises 2x10 black bar   Gait 600 ft  for endurance.      10/30/21  NuStep L3 x 6 min  Resisted gait 20lb 4 way x 3 each   Leg curls 15lb 2x10   Leg Ext 5lb 2x10  Sit to stands 2x10  Rows green 2x10  Shoulder Ext green 2x10  Heel raises black bar 2x10    10/16/21  Gait training with VC for step through and weight shift during the 6 minute walk test for 1000'   PATIENT EDUCATION:  Education details: HEP  Person educated: Patient Education method: Explanation, Demo, Handout Education comprehension: verbalized understanding   HOME EXERCISE PROGRAM:  [email or web address] [email or web address] Date: 11/11/2021 Prepared by: SEthel Rana Exercises - Shoulder Extension with Resistance  - 1 x daily - 7 x weekly - 2 sets - 10 reps - Standing Bilateral Low Shoulder Row with Anchored Resistance  - 1 x daily - 7 x weekly - 2 sets - 10 reps - Shoulder External Rotation and Scapular Retraction with Resistance  - 1 x daily - 7 x weekly - 2 sets - 10 reps - Side Stepping with Resistance at Thighs  - 1 x daily - 7 x weekly - 2 sets - 10 reps - Standing Bilateral Heel Raise on Step  - 1 x daily - 7 x weekly - 2 sets - 10 reps - Supine Knees to Chest with Swiss Ball  - 1 x daily - 7 x weekly - 2 sets - 10 reps - Bridge with Arms at [employer] on [employer] - 1 x daily - 7 x weekly - 2 sets - 10 reps - Supine Bridge with Resistance Band  - 1 x daily - 7 x weekly - 2 sets - 10 reps - Supine Straight Leg Raises  - 1 x daily - 7 x weekly - 2 sets - 10 reps - Straight Leg Raise with External Rotation  - 1 x daily - 7 x weekly - 2 sets - 10 reps ASSESSMENT:  CLINICAL IMPRESSION: Patient reports some knee pain, she is managing it overall. Continued with progressive strengthening activities for LE and trunk to increase stability. She tolerated all exercises, found hip abd  challenging.    OBJECTIVE IMPAIRMENTS Abnormal gait, decreased activity tolerance, decreased  balance, decreased coordination, difficulty walking, decreased strength, improper body mechanics, postural dysfunction, and pain.   ACTIVITY LIMITATIONS cleaning, community activity, yard work, and shopping.   PERSONAL FACTORS Age, Fitness, Past/current experiences, and 1 comorbidity: R knee arthritis  are also affecting patient's functional outcome.    REHAB POTENTIAL: Good  CLINICAL DECISION MAKING: Stable/uncomplicated  EVALUATION COMPLEXITY: Low   GOALS: Goals reviewed with patient? Yes  SHORT TERM GOALS: Target date: 11/13/2021   Patient will be I with initial HEP Baseline: Updated recently Goal status: ongoing    LONG TERM GOALS: Target date: 12/25/2021   Patient will be I final HEP Baseline:  Goal status: INITIAL  2.  Patient will perform 5x STS in < 8 sec to demonstrate improved BLE strength. Baseline:  Goal status: Progressing  3.  Patient will ambulate at least 2000' during 6 minute walk test with no gait deviations or pain. Baseline:  Goal status: Progressing  4.  Patient will demonstrate at least 4 point improvement from initial BERG Balance score Baseline: TBD Goal status: patient scored 56/56, achieved     PLAN: PT FREQUENCY: 2x/week  PT DURATION: 10 weeks  PLANNED INTERVENTIONS: Therapeutic exercises, Therapeutic activity, Neuromuscular re-education, Balance training, Gait training, Patient/Family education, Dry Needling, Electrical stimulation, Cryotherapy, Moist heat, Vasopneumatic device, Ultrasound, Ionotophoresis '4mg'$ /ml Dexamethasone, and Manual therapy  PLAN FOR NEXT SESSION: Progress strengthening, discuss D/C planning as patient is progressing well.   Marcelina Morel, DPT 11/14/2021, 10:08 AM

## 2021-11-18 ENCOUNTER — Encounter: Payer: Self-pay | Admitting: Physical Therapy

## 2021-11-18 ENCOUNTER — Ambulatory Visit: Payer: Medicare Other | Admitting: Physical Therapy

## 2021-11-18 DIAGNOSIS — R278 Other lack of coordination: Secondary | ICD-10-CM | POA: Diagnosis not present

## 2021-11-18 DIAGNOSIS — R2681 Unsteadiness on feet: Secondary | ICD-10-CM | POA: Diagnosis not present

## 2021-11-18 DIAGNOSIS — R262 Difficulty in walking, not elsewhere classified: Secondary | ICD-10-CM | POA: Diagnosis not present

## 2021-11-18 DIAGNOSIS — M6281 Muscle weakness (generalized): Secondary | ICD-10-CM

## 2021-11-18 DIAGNOSIS — R293 Abnormal posture: Secondary | ICD-10-CM | POA: Diagnosis not present

## 2021-11-18 NOTE — Therapy (Signed)
OUTPATIENT PHYSICAL THERAPY LOWER EXTREMITY TREATMENT   Patient Name: Glorine Hanratty MRN: [id number] DOB:1950-12-31, 71 y.o., female Today's Date: 11/18/2021   PHYSICAL THERAPY DISCHARGE SUMMARY  Visits from Start of Care: 8  Current functional level related to goals / functional outcomes: Progressed well, demosntrates improved strength, stability and endurance, decreased R knee pain   Remaining deficits: Arthritic knee pain remains, fluctuates.   Education / Equipment: HEP   Patient agrees to discharge. Patient goals were partially met. Patient is being discharged due to being pleased with the current functional level.   Past Medical History:  Diagnosis Date   Allergy    Arthritis    Asthma    Depression    GERD (gastroesophageal reflux disease)    Hyperlipidemia    Hypertension    Past Surgical History:  Procedure Laterality Date   ABDOMINAL HYSTERECTOMY  2001   EYE SURGERY     HEMORRHOID SURGERY  1975   MENISCUS REPAIR Left 1999   miscarriage  1977 and 1979   Patient Active Problem List   Diagnosis Date Noted   Essential hypertension 05/07/2020   Prediabetes 05/07/2020   Hyperlipidemia 05/07/2020    PCP: Nani Ravens  REFERRING PROVIDER: Shelda Pal, DO   REFERRING DIAG: R66.81 (ICD-10-CM) - Physical deconditioning   THERAPY DIAG:  No diagnosis found.  ONSET DATE: 10/07/2021   SUBJECTIVE:   SUBJECTIVE STATEMENT:  Patient reports she performed all exercises yesterday. She has mild knee pain.    PAIN:  Are you having pain? 1/10    PRECAUTIONS: None  WEIGHT BEARING RESTRICTIONS No  FALLS:  Has patient fallen in last 6 months? No  LIVING ENVIRONMENT: Lives with: lives alone Lives in: House/apartment Stairs: Yes: External: 2 steps; none Has following equipment at home:  Knee brace, and None  OCCUPATION: Lets friends dogs out.  PLOF: Independent  PATIENT GOALS Be able to get back to feeling healthy and exercise. Avoid being  a couch potato. Her knee had held her back, along with multiple sinus infections. Walk her dog, dance.   OBJECTIVE:   LE MMT:  MMT Right Eval Left Eval  Hip flexion 4 4  Hip extension 4- 4-  Hip abduction 4 4  Hip adduction    Hip internal rotation    Hip external rotation    Knee flexion 4 4  Knee extension 4 4  Ankle dorsiflexion 4 4  Ankle plantarflexion    Ankle inversion    Ankle eversion     (Blank rows = not tested)    FUNCTIONAL TESTS:  Eval 5 times sit to stand: 16.84 Timed up and go (TUG): 10.34 6 minute walk test: 1000'. Steady pace, no rest breaks required.    GAIT:  Step through pattern with RLE maintained in slight ER and R knee valgus.    TODAY'S TREATMENT:   11/18/21 Nu-Step L5 x 6 minutes 6 minute walk test outdoors. .68m Step ups on 4" step, 10 reps each-forward, side, crossover. 5X STS-13. Education for continuing to exercise after D/C.   11/14/21 Recumbent Bike L4 x 6 minutes Leg curls 2 x 10 reps 20# Leg extension 2 x 10 reps 10# Standing hip abduction against R Tband, 2 x10 reps Lat pulls 2 x 10 reps 25# Trunk extension with Black Tband, 2 x 10 reps Seated rows 2 x 10 reps, 15# Seated chest press 2 x 10 15#    11/11/21 NuStep, L5 x 6 minutes. Supine SLR, SLR with ER, 2 x 10 reps  each, B. Bridge over ball, 10 reps, bridge and roll ball side to side x 3, 5 reps Roll knees to chest on ball x 10 Bridge with hip abd against Red Tband 5 reps Standing side steps against red Tband resistance, 2 x 10  11/07/2021 NuStep L5 x 7 min MT- PROM bilateral knees  Seated HS curls red   LAQ 2lb 2x10  Sit to stands 3x5 Standing marches 2x10  Gait 316ft for endurance, slight limp  Ball Squeezes 2x10   11/04/21 NuStep L5 x 5 minutes Shoulder rows, ext, ER 2 x 10 reps with blue Tband resistance Mini squats, 2 x 10 reps B side steps against red T band resistance, 2 x 10 Heel raises on step, 2 x 10    11/01/21  Recumbent bike L1 x4 min    NuStep L3 x 3  4in stpe up 2x5 each   Sit to stand holding yed ball 2x10 second set OHP   Leg curls 20lb 2x10  Leg Ext 5lb 2x10   Rows Blue 2x10  Ext Blue 2x10   Heel raises 2x10 black bar   Gait 600 ft  for endurance.      10/30/21  NuStep L3 x 6 min  Resisted gait 20lb 4 way x 3 each   Leg curls 15lb 2x10   Leg Ext 5lb 2x10  Sit to stands 2x10  Rows green 2x10  Shoulder Ext green 2x10  Heel raises black bar 2x10    10/16/21  Gait training with VC for step through and weight shift during the 6 minute walk test for 1000'   PATIENT EDUCATION:  Education details: HEP  Person educated: Patient Education method: Explanation, Demo, Handout Education comprehension: verbalized understanding   HOME EXERCISE PROGRAM:  Access Code: [facility] URL: [email or web address] Date: 11/11/2021 Prepared by: Ethel Rana  Exercises - Shoulder Extension with Resistance  - 1 x daily - 7 x weekly - 2 sets - 10 reps - Standing Bilateral Low Shoulder Row with Anchored Resistance  - 1 x daily - 7 x weekly - 2 sets - 10 reps - Shoulder External Rotation and Scapular Retraction with Resistance  - 1 x daily - 7 x weekly - 2 sets - 10 reps - Side Stepping with Resistance at Thighs  - 1 x daily - 7 x weekly - 2 sets - 10 reps - Standing Bilateral Heel Raise on Step  - 1 x daily - 7 x weekly - 2 sets - 10 reps - Supine Knees to Chest with Swiss Ball  - 1 x daily - 7 x weekly - 2 sets - 10 reps - Bridge with Arms at [employer] and Feet on [employer]  - 1 x daily - 7 x weekly - 2 sets - 10 reps - Supine Bridge with Resistance Band  - 1 x daily - 7 x weekly - 2 sets - 10 reps - Supine Straight Leg Raises  - 1 x daily - 7 x weekly - 2 sets - 10 reps - Straight Leg Raise with External Rotation  - 1 x daily - 7 x weekly - 2 sets - 10 reps ASSESSMENT:  CLINICAL IMPRESSION: Patient reports that she is proud to have performed the entire HEP yesterday without issues. Re-assessed for D/C. Patient  did not meet some of the LTG, but demonstrates much improved strength, stability, and activity tolerance, is happy with her progress and confident she can continue at home.  OBJECTIVE IMPAIRMENTS Abnormal gait, decreased activity tolerance, decreased  balance, decreased coordination, difficulty walking, decreased strength, improper body mechanics, postural dysfunction, and pain.   ACTIVITY LIMITATIONS cleaning, community activity, yard work, and shopping.   PERSONAL FACTORS Age, Fitness, Past/current experiences, and 1 comorbidity: R knee arthritis  are also affecting patient's functional outcome.    REHAB POTENTIAL: Good  CLINICAL DECISION MAKING: Stable/uncomplicated  EVALUATION COMPLEXITY: Low   GOALS: Goals reviewed with patient? Yes  SHORT TERM GOALS: Target date: 11/13/2021   Patient will be I with initial HEP Baseline: Updated recently Goal status: met    LONG TERM GOALS: Target date: 12/25/2021   Patient will be I final HEP Baseline:  Goal status: met  2.  Patient will perform 5x STS in < 8 sec to demonstrate improved BLE strength. Baseline: 13 Goal status: Not met, but patient is much more stable and smooth an reports much more confidence while performing.  3.  Patient will ambulate at least 2000' during 6 minute walk test with no gait deviations or pain. Baseline: 1161 Goal status: Not met, walked outdoors on unlevel surfaces, no SOB or fatigue, no instability noted.  4.  Patient will demonstrate at least 4 point improvement from initial BERG Balance score Baseline: TBD Goal status: patient scored 56/56, achieved     PLAN: PT FREQUENCY: 2x/week  PT DURATION: 10 weeks  PLANNED INTERVENTIONS: Therapeutic exercises, Therapeutic activity, Neuromuscular re-education, Balance training, Gait training, Patient/Family education, Dry Needling, Electrical stimulation, Cryotherapy, Moist heat, Vasopneumatic device, Ultrasound, Ionotophoresis 4mg /ml Dexamethasone, and  Manual therapy  PLAN FOR NEXT SESSION: D/C   Marcelina Morel, DPT 11/18/2021, 9:21 AM

## 2021-11-21 ENCOUNTER — Ambulatory Visit: Payer: Medicare Other | Admitting: Physical Therapy

## 2021-11-25 ENCOUNTER — Ambulatory Visit: Payer: Medicare Other | Admitting: Physical Therapy

## 2021-11-27 ENCOUNTER — Ambulatory Visit: Payer: Medicare Other | Admitting: Physical Therapy

## 2021-12-09 DIAGNOSIS — Z0289 Encounter for other administrative examinations: Secondary | ICD-10-CM

## 2021-12-24 ENCOUNTER — Encounter (INDEPENDENT_AMBULATORY_CARE_PROVIDER_SITE_OTHER): Payer: Self-pay | Admitting: Family Medicine

## 2021-12-24 ENCOUNTER — Other Ambulatory Visit (INDEPENDENT_AMBULATORY_CARE_PROVIDER_SITE_OTHER): Payer: Self-pay | Admitting: Family Medicine

## 2021-12-24 ENCOUNTER — Ambulatory Visit (INDEPENDENT_AMBULATORY_CARE_PROVIDER_SITE_OTHER): Payer: Medicare Other | Admitting: Family Medicine

## 2021-12-24 VITALS — BP 122/74 | HR 67 | Temp 98.8°F | Ht 61.0 in | Wt 223.0 lb

## 2021-12-24 DIAGNOSIS — D508 Other iron deficiency anemias: Secondary | ICD-10-CM | POA: Diagnosis not present

## 2021-12-24 DIAGNOSIS — R0602 Shortness of breath: Secondary | ICD-10-CM

## 2021-12-24 DIAGNOSIS — E7849 Other hyperlipidemia: Secondary | ICD-10-CM

## 2021-12-24 DIAGNOSIS — F3289 Other specified depressive episodes: Secondary | ICD-10-CM | POA: Diagnosis not present

## 2021-12-24 DIAGNOSIS — I10 Essential (primary) hypertension: Secondary | ICD-10-CM | POA: Diagnosis not present

## 2021-12-24 DIAGNOSIS — R7303 Prediabetes: Secondary | ICD-10-CM | POA: Diagnosis not present

## 2021-12-24 DIAGNOSIS — E669 Obesity, unspecified: Secondary | ICD-10-CM | POA: Diagnosis not present

## 2021-12-24 DIAGNOSIS — R5383 Other fatigue: Secondary | ICD-10-CM

## 2021-12-24 DIAGNOSIS — Z6841 Body Mass Index (BMI) 40.0 and over, adult: Secondary | ICD-10-CM | POA: Diagnosis not present

## 2021-12-25 LAB — HEMOGLOBIN A1C
Est. average glucose Bld gHb Est-mCnc: 128 mg/dL
Hgb A1c MFr Bld: 6.1 % — ABNORMAL HIGH (ref 4.8–5.6)

## 2021-12-25 LAB — CBC
Hematocrit: 35.9 % (ref 34.0–46.6)
Hemoglobin: 12 g/dL (ref 11.1–15.9)
MCH: 27.3 pg (ref 26.6–33.0)
MCHC: 33.4 g/dL (ref 31.5–35.7)
MCV: 82 fL (ref 79–97)
Platelets: 284 10*3/uL (ref 150–450)
RBC: 4.39 x10E6/uL (ref 3.77–5.28)
RDW: 13.3 % (ref 11.7–15.4)
WBC: 4.1 10*3/uL (ref 3.4–10.8)

## 2021-12-25 LAB — COMPREHENSIVE METABOLIC PANEL
ALT: 17 IU/L (ref 0–32)
AST: 21 IU/L (ref 0–40)
Albumin/Globulin Ratio: 1.6 (ref 1.2–2.2)
Albumin: 4.3 g/dL (ref 3.8–4.8)
Alkaline Phosphatase: 91 IU/L (ref 44–121)
BUN/Creatinine Ratio: 15 (ref 12–28)
BUN: 13 mg/dL (ref 8–27)
Bilirubin Total: <0.2 mg/dL (ref 0.0–1.2)
CO2: 27 mmol/L (ref 20–29)
Calcium: 9.3 mg/dL (ref 8.7–10.3)
Chloride: 99 mmol/L (ref 96–106)
Creatinine, Ser: 0.87 mg/dL (ref 0.57–1.00)
Globulin, Total: 2.7 g/dL (ref 1.5–4.5)
Glucose: 95 mg/dL (ref 70–99)
Potassium: 3.9 mmol/L (ref 3.5–5.2)
Sodium: 141 mmol/L (ref 134–144)
Total Protein: 7 g/dL (ref 6.0–8.5)
eGFR: 71 mL/min/{1.73_m2} (ref >59)

## 2021-12-25 LAB — FOLATE: Folate: 17.3 ng/mL (ref >3.0)

## 2021-12-25 LAB — INSULIN, RANDOM: INSULIN: 10.8 u[IU]/mL (ref 2.6–24.9)

## 2022-01-06 NOTE — Progress Notes (Unsigned)
Chief Complaint:   OBESITY Olivia Burns (MR# [id number]) is a 71 y.o. female who presents for evaluation and treatment of obesity and related comorbidities. Current BMI is Body mass index is 42.14 kg/m. Olivia Burns has been struggling with her weight for many years and has been unsuccessful in either losing weight, maintaining weight loss, or reaching her healthy weight goal.  Olivia Burns has been overweight since middle school.  Has been on multiple diets.  Snacks on junk food.  Usually skips breakfast.  Drinks 0-2 sugar sweetened beverages daily.  Retired, and lives alone.  Has a family history of obesity.  Never used antiobesity medications.  Does chair yoga 1 time per week.  Has done intermittent fasting.  Craves sweets and stress eats.  Poor sleep at night.  Olivia Burns is currently in the action stage of change and ready to dedicate time achieving and maintaining a healthier weight. Olivia Burns is interested in becoming our patient and working on intensive lifestyle modifications including (but not limited to) diet and exercise for weight loss.  Olivia Burns's habits were reviewed today and are as follows: her desired weight loss is 93 lbs, she has been heavy most of her life, she started gaining weight in the 6th grade, her heaviest weight ever was 238 pounds, she has significant food cravings issues, she skips meals frequently, she is frequently drinking liquids with calories, she frequently makes poor food choices, she frequently eats larger portions than normal, and she struggles with emotional eating.  Depression Screen Olivia Burns's Food and Mood (modified PHQ-9) score was 11.     12/24/2021    9:06 AM  Depression screen PHQ 2/9  Decreased Interest 3  Down, Depressed, Hopeless 1  PHQ - 2 Score 4  Altered sleeping 1  Tired, decreased energy 2  Change in appetite 2  Feeling bad or failure about yourself  0  Trouble concentrating 1  Moving slowly or fidgety/restless 1  Suicidal thoughts 0  PHQ-9  Score 11  Difficult doing work/chores Somewhat difficult   Subjective:   1. Other fatigue Olivia Burns admits to daytime somnolence and admits to waking up still tired. Patient has a history of symptoms of daytime fatigue, morning fatigue, and morning headache. Olivia Burns generally gets 4 or 5 hours of sleep per night, and states that she has nightime awakenings. Snoring is present. Apneic episodes are not present. Epworth Sleepiness Score is 18.  B12 and vitamin D levels reviewed from 10/07/2021, within normal limits.  On multivitamins daily.  2. SOB (shortness of breath) Olivia Burns notes increasing shortness of breath with exercising and seems to be worsening over time with weight gain. She notes getting out of breath sooner with activity than she used to. This has not gotten worse recently. Olivia Burns denies shortness of breath at rest or orthopnea.  3. Essential hypertension Olivia Burns's blood pressure is well-controlled.  She is on valsartan-hydrochlorothiazide 80-12.5 mg daily.  She denies chest pain.  EKG today: Sinus rhythm 65 bpm.  4. Other iron deficiency anemia Olivia Burns is on ferrous gluconate 324 mg daily.  Last ferritin 39.5 on 07/26/2021.  5. Prediabetes Olivia Burns's last A1c was 6.2 on 07/09/2021.  Currently consumes excess sugar.  6. Other hyperlipidemia On 07/09/2021 Olivia Burns's total cholesterol was 219, LDL 141, triglycerides 138, and HDL 51.  Previously had myalgias on Crestor, not on prescription treatment.  7. Other depression, with emotional eating Olivia Burns PHQ-9 score was 12: Moderate depression.  Not on treatment for moods.  Lack of good support system.  Has poor sleep at night.  Assessment/Plan:   1. Other fatigue Olivia Burns does feel that her weight is causing her energy to be lower than it should be. Fatigue may be related to obesity, depression or many other causes. Labs will be ordered, and in the meanwhile, Olivia Burns will focus on self care including making healthy food choices, increasing physical  activity and focusing on stress reduction.  - EKG 12-Lead - Folate  2. SOB (shortness of breath) Olivia Burns does feel that she gets out of breath more easily that she used to when she exercises. Olivia Burns's shortness of breath appears to be obesity related and exercise induced. She has agreed to work on weight loss and gradually increase exercise to treat her exercise induced shortness of breath. Will continue to monitor closely.  3. Essential hypertension We will check labs today.  Olivia Burns will continue with a heart healthy diet and her blood pressure medications as directed.  - CBC - Comprehensive metabolic panel  4. Other iron deficiency anemia Olivia Burns is scheduled for colorectal cancer screening in 2024 (every 5 years).  5. Prediabetes We will check labs today.  Olivia Burns will begin to decrease her intake of sweets.  - Hemoglobin A1c - Insulin, random  6. Other hyperlipidemia Olivia Burns will continue to work on dietary changes and regular exercise.  7. Other depression, with emotional eating Olivia Burns will consider adding Wellbutrin SR at her next visit.  8. Obesity current BMI-42.3 Olivia Burns is currently in the action stage of change and her goal is to continue with weight loss efforts. I recommend Olivia Burns begin the structured treatment plan as follows:  She has agreed to the Category 1 Plan.  Reviewed EKG results: Normal sinus rhythm.  Labs reviewed and updated.  Exercise goals: Will check out local gyms for Silver sneakers.  Behavioral modification strategies: increasing lean protein intake, increasing water intake, decreasing liquid calories, no skipping meals, meal planning and cooking strategies, ways to avoid boredom eating, and better snacking choices.  She was informed of the importance of frequent follow-up visits to maximize her success with intensive lifestyle modifications for her multiple health conditions. She was informed we would discuss her lab results at her next visit unless  there is a critical issue that needs to be addressed sooner. Olivia Burns agreed to keep her next visit at the agreed upon time to discuss these results.  Objective:   Blood pressure 122/74, pulse 67, temperature 98.8 F (37.1 C), height '5\' 1"'$  (1.549 m), weight 223 lb (101.2 kg), SpO2 97 %. Body mass index is 42.14 kg/m.  EKG: Normal sinus rhythm, rate 65 BPM.  Indirect Calorimeter completed today shows a VO2 of 211 and a REE of 1454.  Her calculated basal metabolic rate is 4034 thus her basal metabolic rate is worse than expected.  General: Cooperative, alert, well developed, in no acute distress. HEENT: Conjunctivae and lids unremarkable. Cardiovascular: Regular rhythm.  Lungs: Normal work of breathing. Neurologic: No focal deficits.   Lab Results  Component Value Date   CREATININE 0.87 12/24/2021   BUN 13 12/24/2021   NA 141 12/24/2021   K 3.9 12/24/2021   CL 99 12/24/2021   CO2 27 12/24/2021   Lab Results  Component Value Date   ALT 17 12/24/2021   AST 21 12/24/2021   ALKPHOS 91 12/24/2021   BILITOT <0.2 12/24/2021   Lab Results  Component Value Date   HGBA1C 6.1 (H) 12/24/2021   HGBA1C 6.2 07/09/2021   HGBA1C 6.1 05/07/2020   Lab Results  Component Value Date   INSULIN 10.8 12/24/2021   Lab Results  Component Value Date   TSH 2.06 07/09/2021   Lab Results  Component Value Date   CHOL 219 (H) 07/09/2021   HDL 51.00 07/09/2021   LDLCALC 141 (H) 07/09/2021   TRIG 138.0 07/09/2021   CHOLHDL 4 07/09/2021   Lab Results  Component Value Date   WBC 4.1 12/24/2021   HGB 12.0 12/24/2021   HCT 35.9 12/24/2021   MCV 82 12/24/2021   PLT 284 12/24/2021   Lab Results  Component Value Date   FERRITIN 39.5 07/26/2021   Obesity Behavioral Intervention:   Approximately 15 minutes were spent on the discussion below.  ASK: We discussed the diagnosis of obesity with Olivia Burns today and Olivia Burns agreed to give [place] permission to discuss obesity behavioral modification  therapy today.  ASSESS: Koren has the diagnosis of obesity and her BMI today is 42.3. Olivia Burns is in the action stage of change.   ADVISE: Catlin was educated on the multiple health risks of obesity as well as the benefit of weight loss to improve her health. She was advised of the need for long term treatment and the importance of lifestyle modifications to improve her current health and to decrease her risk of future health problems.  AGREE: Multiple dietary modification options and treatment options were discussed and Olivia Burns agreed to follow the recommendations documented in the above note.  ARRANGE: Olivia Burns was educated on the importance of frequent visits to treat obesity as outlined per CMS and USPSTF guidelines and agreed to schedule her next follow up appointment today.  Attestation Statements:   Reviewed by clinician on day of visit: allergies, medications, problem list, medical history, surgical history, family history, social history, and previous encounter notes.  Wilhemena Durie, am acting as transcriptionist for Loyal Gambler, DO.  I have reviewed the above documentation for accuracy and completeness, and I agree with the above. - ***

## 2022-01-07 ENCOUNTER — Ambulatory Visit (INDEPENDENT_AMBULATORY_CARE_PROVIDER_SITE_OTHER): Payer: Medicare Other | Admitting: Family Medicine

## 2022-01-07 ENCOUNTER — Encounter (INDEPENDENT_AMBULATORY_CARE_PROVIDER_SITE_OTHER): Payer: Self-pay | Admitting: Family Medicine

## 2022-01-07 VITALS — BP 116/72 | HR 73 | Temp 98.7°F | Ht 61.0 in | Wt 218.0 lb

## 2022-01-07 DIAGNOSIS — F3289 Other specified depressive episodes: Secondary | ICD-10-CM

## 2022-01-07 DIAGNOSIS — Z6841 Body Mass Index (BMI) 40.0 and over, adult: Secondary | ICD-10-CM

## 2022-01-07 DIAGNOSIS — E7849 Other hyperlipidemia: Secondary | ICD-10-CM

## 2022-01-07 DIAGNOSIS — I10 Essential (primary) hypertension: Secondary | ICD-10-CM

## 2022-01-07 DIAGNOSIS — E669 Obesity, unspecified: Secondary | ICD-10-CM

## 2022-01-07 DIAGNOSIS — R7303 Prediabetes: Secondary | ICD-10-CM

## 2022-01-07 MED ORDER — BUPROPION HCL ER (SR) 150 MG PO TB12: 150.0000 mg | ORAL_TABLET | Freq: Every day | ORAL | 0 refills | Status: DC

## 2022-01-08 ENCOUNTER — Encounter (INDEPENDENT_AMBULATORY_CARE_PROVIDER_SITE_OTHER): Payer: Self-pay

## 2022-01-14 NOTE — Progress Notes (Unsigned)
Chief Complaint:   OBESITY Olivia Burns is here to discuss her progress with her obesity treatment plan along with follow-up of her obesity related diagnoses. Olivia Burns is on {MWMwtlossportion/plan2:23431} and states she is following her eating plan approximately ***% of the time. Olivia Burns states she is *** *** minutes *** times per week.  Today's visit was #: *** Starting weight: *** Starting date: *** Today's weight: *** Today's date: 01/07/2022 Total lbs lost to date: *** Total lbs lost since last in-office visit: ***  Interim History: ***  Subjective:   1. Prediabetes ***  2. Benign essential HTN ***  3. Other hyperlipidemia ***  4. Other depression, with emotional eating ***  Assessment/Plan:   1. Prediabetes ***  2. Benign essential HTN ***  3. Other hyperlipidemia ***  4. Other depression, with emotional eating *** - buPROPion (WELLBUTRIN SR) 150 MG 12 hr tablet; Take 1 tablet (150 mg total) by mouth daily.  Dispense: 30 tablet; Refill: 0  5. Obesity current BMI-41.3 Olivia Burns {CHL AMB IS/IS NOT:[id number]} currently in the action stage of change. As such, her goal is to {MWMwtloss#1:210800005}. She has agreed to {MWMwtlossportion/plan2:23431}.   Exercise goals: {MWM EXERCISE RECS:23473}  Behavioral modification strategies: {MWMwtlossdietstrategies3:23432}.  Olivia Burns has agreed to follow-up with our clinic in {NUMBER 1-10:22536} weeks. She was informed of the importance of frequent follow-up visits to maximize her success with intensive lifestyle modifications for her multiple health conditions.   Objective:   Blood pressure 116/72, pulse 73, temperature 98.7 F (37.1 C), height '5\' 1"'$  (1.549 m), weight 218 lb (98.9 kg), SpO2 97 %. Body mass index is 41.19 kg/m.  General: Cooperative, alert, well developed, in no acute distress. HEENT: Conjunctivae and lids unremarkable. Cardiovascular: Regular rhythm.  Lungs: Normal work of breathing. Neurologic: No focal  deficits.   Lab Results  Component Value Date   CREATININE 0.87 12/24/2021   BUN 13 12/24/2021   NA 141 12/24/2021   K 3.9 12/24/2021   CL 99 12/24/2021   CO2 27 12/24/2021   Lab Results  Component Value Date   ALT 17 12/24/2021   AST 21 12/24/2021   ALKPHOS 91 12/24/2021   BILITOT <0.2 12/24/2021   Lab Results  Component Value Date   HGBA1C 6.1 (H) 12/24/2021   HGBA1C 6.2 07/09/2021   HGBA1C 6.1 05/07/2020   Lab Results  Component Value Date   INSULIN 10.8 12/24/2021   Lab Results  Component Value Date   TSH 2.06 07/09/2021   Lab Results  Component Value Date   CHOL 219 (H) 07/09/2021   HDL 51.00 07/09/2021   LDLCALC 141 (H) 07/09/2021   TRIG 138.0 07/09/2021   CHOLHDL 4 07/09/2021   Lab Results  Component Value Date   VD25OH 70.78 10/07/2021   VD25OH 117.36 (HH) 07/09/2021   Lab Results  Component Value Date   WBC 4.1 12/24/2021   HGB 12.0 12/24/2021   HCT 35.9 12/24/2021   MCV 82 12/24/2021   PLT 284 12/24/2021   Lab Results  Component Value Date   FERRITIN 39.5 07/26/2021    Obesity Behavioral Intervention:   Approximately 15 minutes were spent on the discussion below.  ASK: We discussed the diagnosis of obesity with Olivia Burns today and Olivia Burns agreed to give Olivia Burns permission to discuss obesity behavioral modification therapy today.  ASSESS: Olivia Burns has the diagnosis of obesity and her BMI today is 41.3. Olivia Burns is in the action stage of change.   ADVISE: Olivia Burns was educated on the multiple health risks of  obesity as well as the benefit of weight loss to improve her health. She was advised of the need for long term treatment and the importance of lifestyle modifications to improve her current health and to decrease her risk of future health problems.  AGREE: Multiple dietary modification options and treatment options were discussed and Olivia Burns agreed to follow the recommendations documented in the above note.  ARRANGE: Olivia Burns was educated on the  importance of frequent visits to treat obesity as outlined per CMS and USPSTF guidelines and agreed to schedule her next follow up appointment today.  Attestation Statements:   Reviewed by clinician on day of visit: allergies, medications, problem list, medical history, surgical history, family history, social history, and previous encounter notes.   Wilhemena Durie, am acting as transcriptionist for Loyal Gambler, DO.  I have reviewed the above documentation for accuracy and completeness, and I agree with the above. -  ***

## 2022-01-27 ENCOUNTER — Encounter (INDEPENDENT_AMBULATORY_CARE_PROVIDER_SITE_OTHER): Payer: Self-pay | Admitting: Family Medicine

## 2022-01-27 ENCOUNTER — Ambulatory Visit (INDEPENDENT_AMBULATORY_CARE_PROVIDER_SITE_OTHER): Payer: Medicare Other | Admitting: Family Medicine

## 2022-01-27 VITALS — BP 110/75 | HR 85 | Temp 97.9°F | Ht 61.0 in | Wt 216.0 lb

## 2022-01-27 DIAGNOSIS — F3289 Other specified depressive episodes: Secondary | ICD-10-CM | POA: Diagnosis not present

## 2022-01-27 DIAGNOSIS — Z6841 Body Mass Index (BMI) 40.0 and over, adult: Secondary | ICD-10-CM | POA: Diagnosis not present

## 2022-01-27 DIAGNOSIS — I10 Essential (primary) hypertension: Secondary | ICD-10-CM | POA: Diagnosis not present

## 2022-01-27 DIAGNOSIS — E669 Obesity, unspecified: Secondary | ICD-10-CM

## 2022-01-27 MED ORDER — BUPROPION HCL ER (SR) 150 MG PO TB12: 150.0000 mg | ORAL_TABLET | Freq: Every day | ORAL | 0 refills | Status: DC

## 2022-02-01 NOTE — Progress Notes (Signed)
Chief Complaint:   OBESITY Olivia Burns is here to discuss her progress with her obesity treatment plan along with follow-up of her obesity related diagnoses. Olivia Burns is on the Category 1 Plan and states she is following her eating plan approximately 98% of the time. Olivia Burns states she is not exercising.   Today's visit was #: 3 Starting weight: 223 lbs Starting date: 12/24/2021 Today's weight: 216 lbs Today's date: 01/27/2022 Total lbs lost to date: 7 Total lbs lost since last in-office visit: 2  Interim History: Olivia Burns is doing well on Category 1. She denies hunger. For 1 to 2 days, she wasn't able to get in all of her food. 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 She is doing the sandwich option at lunch. She does not have plans for Labor day. Olivia Burns does not foresee any obstacles in the next few weeks.  Subjective:   1. Essential hypertension Olivia Burns's blood pressure is well controlled today at 110/75. Her blood pressure is steadily coming down. She denies dizziness and lightheadedness.  2. Other depression, with emotional eating Olivia Burns is on Wellbutrin '150mg'$  and she has noticed a significant decrease in cravings. No side effects are noted.  Assessment/Plan:   1. Essential hypertension We will follow up Olivia Burns's blood pressure at her next appointment.  2. Other depression, with emotional eating Olivia Burns agrees to continue her Wellbutrin and will follow up as directed.  - buPROPion (WELLBUTRIN SR) 150 MG 12 hr tablet; Take 1 tablet (150 mg total) by mouth daily.  Dispense: 30 tablet; Refill: 0  3. Obesity with current BMI of 41.0 Olivia Burns is currently in the action stage of change. As such, her goal is to continue with weight loss efforts. She has agreed to the Category 1 Plan.   Exercise goals: All adults should avoid inactivity. Some physical activity is better than none, and adults who participate in any amount of physical activity gain some health benefits.  Behavioral  modification strategies: increasing lean protein intake, meal planning and cooking strategies, keeping healthy foods in the home, and planning for success.  Olivia Burns has agreed to follow-up with our clinic in 2 to 3 weeks. She was informed of the importance of frequent follow-up visits to maximize her success with intensive lifestyle modifications for her multiple health conditions.   Objective:   Blood pressure 110/75, pulse 85, temperature 97.9 F (36.6 C), height '5\' 1"'$  (1.549 m), weight 216 lb (98 kg), SpO2 97 %. Body mass index is 40.81 kg/m.  General: Cooperative, alert, well developed, in no acute distress. HEENT: Conjunctivae and lids unremarkable. Cardiovascular: Regular rhythm.  Lungs: Normal work of breathing. Neurologic: No focal deficits.   Lab Results  Component Value Date   CREATININE 0.87 12/24/2021   BUN 13 12/24/2021   NA 141 12/24/2021   K 3.9 12/24/2021   CL 99 12/24/2021   CO2 27 12/24/2021   Lab Results  Component Value Date   ALT 17 12/24/2021   AST 21 12/24/2021   ALKPHOS 91 12/24/2021   BILITOT <0.2 12/24/2021   Lab Results  Component Value Date   HGBA1C 6.1 (H) 12/24/2021   HGBA1C 6.2 07/09/2021   HGBA1C 6.1 05/07/2020   Lab Results  Component Value Date   INSULIN 10.8 12/24/2021   Lab Results  Component Value Date   TSH 2.06 07/09/2021   Lab Results  Component Value Date   CHOL 219 (H) 07/09/2021   HDL 51.00 07/09/2021   LDLCALC 141 (H) 07/09/2021  TRIG 138.0 07/09/2021   CHOLHDL 4 07/09/2021   Lab Results  Component Value Date   VD25OH 70.78 10/07/2021   VD25OH 117.36 (HH) 07/09/2021   Lab Results  Component Value Date   WBC 4.1 12/24/2021   HGB 12.0 12/24/2021   HCT 35.9 12/24/2021   MCV 82 12/24/2021   PLT 284 12/24/2021   Lab Results  Component Value Date   FERRITIN 39.5 07/26/2021    Obesity Behavioral Intervention:   Approximately 15 minutes were spent on the discussion below.  ASK: We discussed the diagnosis  of obesity with Olivia Burns today and Olivia Burns agreed to give [place] permission to discuss obesity behavioral modification therapy today.  ASSESS: Olivia Burns has the diagnosis of obesity and her BMI today is 41.0. Olivia Burns is in the action stage of change.   ADVISE: Olivia Burns was educated on the multiple health risks of obesity as well as the benefit of weight loss to improve her health. She was advised of the need for long term treatment and the importance of lifestyle modifications to improve her current health and to decrease her risk of future health problems.  AGREE: Multiple dietary modification options and treatment options were discussed and Olivia Burns agreed to follow the recommendations documented in the above note.  ARRANGE: Olivia Burns was educated on the importance of frequent visits to treat obesity as outlined per CMS and USPSTF guidelines and agreed to schedule her next follow up appointment today.  Attestation Statements:   Reviewed by clinician on day of visit: allergies, medications, problem list, medical history, surgical history, family history, social history, and previous encounter notes.  IMarcille Blanco, CMA, am acting as transcriptionist for Coralie Common, MD  I have reviewed the above documentation for accuracy and completeness, and I agree with the above. - Coralie Common, MD

## 2022-02-05 NOTE — Progress Notes (Signed)
Synopsis: Referred for excessive daytime sleepiness by Shelda Pal*  Subjective:   PATIENT ID: Olivia Burns GENDER: female DOB: 08-27-1950, MRN: [id number]  Chief Complaint  Patient presents with   Follow-up    Doing well and sleeping better. No new co's.    70yF with history of asthma - sounds mild intermittent and uses albuterol inhaler as needed, allergy, GERD, HTN, depression seen for excessive daytime sleepiness with concern for OSA  Seen recently by PCP with possible viral URI but concern as well for OSA.  She has been feeling sleepy for a while since at least last year. She snores loudly - wakes herself up. No PND. Very sleepy during the day. She does have some RLS sensation. She does do some sleep-talking. She doesn't think she sleep walks or gets up and eats or does anything else unusual at night. She has never had seizures before.   She has never had sleep study before.   She may take melatonin on occasion.   No family history of lung disease.   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 She has never lived outside of [place]. She smoked only every now and then. 2 packs in her lifetime.   Interval HPI:  Started on iron last visit referred to medical weight loss clinic. She feels well, sleep quality improved with iron supplementation and she's actually lost 10 lb!  Otherwise pertinent review of systems is negative.    Past Medical History:  Diagnosis Date   Allergy    Anemia    Anxiety    Arthritis    Asthma    Constipation    Depression    GERD (gastroesophageal reflux disease)    Hyperlipidemia    Hypertension    Osteoarthritis    Pre-diabetes    Sleep apnea      Family History  Problem Relation Age of Onset   Arthritis Mother    Asthma Mother    Depression Mother    Diabetes Mother    Heart disease Mother    Kidney disease Mother    Arthritis Father    Depression Father    Hyperlipidemia Father    Hypertension Father    Cancer Father         prostate   Cancer Sister        gastric vs pancreatic   Drug abuse Brother    Early death Brother      Past Surgical History:  Procedure Laterality Date   ABDOMINAL HYSTERECTOMY  2001   EYE SURGERY     HEMORRHOID SURGERY  1975   MENISCUS REPAIR Left 1999   miscarriage  16 and 1979    Social History   Socioeconomic History   Marital status: Single    Spouse name: Not on file   Number of children: Not on file   Years of education: Not on file   Highest education level: Not on file  Occupational History   Not on file  Tobacco Use   Smoking status: Former    Types: Cigarettes   Smokeless tobacco: Never   Tobacco comments:    "Tried smoking"   Vaping Use   Vaping Use: Never used  Substance and Sexual Activity   Alcohol use: Yes    Comment: occasional   Drug use: Never   Sexual activity: Not on file  Other Topics Concern   Not on file  Social History Narrative   Not on file   Social Determinants of Health  Financial Resource Strain: Low Risk  (04/04/2021)   Overall Financial Resource Strain (CARDIA)    Difficulty of Paying Living Expenses: Not hard at all  Food Insecurity: No Food Insecurity (04/04/2021)   Hunger Vital Sign    Worried About Running Out of Food in the Last Year: Never true    Ran Out of Food in the Last Year: Never true  Transportation Needs: No Transportation Needs (04/04/2021)   PRAPARE - Hydrologist (Medical): No    Lack of Transportation (Non-Medical): No  Physical Activity: Sufficiently Active (04/04/2021)   Exercise Vital Sign    Days of Exercise per Week: 7 days    Minutes of Exercise per Session: 30 min  Stress: No Stress Concern Present (04/04/2021)   Christine    Feeling of Stress : Only a little  Social Connections: Socially Isolated (04/04/2021)   Social Connection and Isolation Panel [NHANES]    Frequency of Communication with  Friends and Family: More than three times a week    Frequency of Social Gatherings with Friends and Family: More than three times a week    Attends Religious Services: Never    Marine scientist or Organizations: No    Attends Archivist Meetings: Never    Marital Status: Never married  Intimate Partner Violence: Not At Risk (04/04/2021)   Humiliation, Afraid, Rape, and Kick questionnaire    Fear of Current or Ex-Partner: No    Emotionally Abused: No    Physically Abused: No    Sexually Abused: No     Allergies  Allergen Reactions   Codeine Other (See Comments)    Problems breathing   Penicillins Itching   Latex Rash     Outpatient Medications Prior to Visit  Medication Sig Dispense Refill   Ascorbic Acid (VITAMIN C-ROSE HIPS-ACEROLA PO) Take 1 capsule by mouth daily.     b complex vitamins capsule Take 1 capsule by mouth daily.     buPROPion (WELLBUTRIN SR) 150 MG 12 hr tablet Take 1 tablet (150 mg total) by mouth daily. 30 tablet 0   co-enzyme Q-10 30 MG capsule Take 30 mg by mouth 3 (three) times daily.     diclofenac Sodium (VOLTAREN) 1 % GEL Apply 2 g topically 4 (four) times daily.     Docusate Calcium (STOOL SOFTENER PO) Stool softener     ferrous gluconate (FERGON) 324 MG tablet Take 1 tablet (324 mg total) by mouth daily with breakfast. 90 tablet 1   fluticasone furoate-vilanterol (BREO ELLIPTA) 100-25 MCG/ACT AEPB Inhale 1 puff into the lungs daily as needed.     linaclotide (LINZESS) 145 MCG CAPS capsule Take 145 mcg by mouth daily before breakfast.     MAGNESIUM GLYCINATE PO Take 400 capsules by mouth daily.     milk thistle 175 MG tablet Take 175 mg by mouth daily.     Multiple Vitamins-Minerals (CENTRUM SILVER 50+WOMEN) TABS Take 1 tablet by mouth daily.     omeprazole (PRILOSEC) 20 MG capsule Take 40 mg by mouth daily.     TURMERIC PO Take 1,000 mg by mouth daily.     UNABLE TO FIND Med Name: Tereasa Coop     UNABLE TO FIND Med Name: [place]  Cinnamon     valsartan-hydrochlorothiazide (DIOVAN-HCT) 80-12.5 MG tablet TAKE 1 TABLET BY MOUTH EVERY DAY 90 tablet 1   No facility-administered medications prior to visit.  Objective:   Physical Exam:  General appearance: 70 y.o., female, NAD, conversant  Eyes: anicteric sclerae; PERRL, tracking appropriately HENT: NCAT; MMM, mallampati 3 Neck: Trachea midline; no lymphadenopathy, no JVD Lungs: CTAB, no crackles, no wheeze, with normal respiratory effort CV: RRR, no murmur  Abdomen: Soft, non-tender; non-distended, BS present  Extremities: No peripheral edema, warm Skin: Normal turgor and texture; no rash Psych: Appropriate affect Neuro: Alert and oriented to person and place, no focal deficit     Vitals:   02/06/22 0939  BP: 122/70  Pulse: 66  Temp: 98 F (36.7 C)  TempSrc: Oral  SpO2: 97%  Weight: 217 lb 12.8 oz (98.8 kg)  Height: '5\' 1"'$  (1.549 m)     97% on RA BMI Readings from Last 3 Encounters:  02/06/22 41.15 kg/m  01/27/22 40.81 kg/m  01/07/22 41.19 kg/m   Wt Readings from Last 3 Encounters:  02/06/22 217 lb 12.8 oz (98.8 kg)  01/27/22 216 lb (98 kg)  01/07/22 218 lb (98.9 kg)     CBC    Component Value Date/Time   WBC 4.1 12/24/2021 1412   RBC 4.39 12/24/2021 1412   HGB 12.0 12/24/2021 1412   HCT 35.9 12/24/2021 1412   PLT 284 12/24/2021 1412   MCV 82 12/24/2021 1412   MCH 27.3 12/24/2021 1412   MCHC 33.4 12/24/2021 1412   RDW 13.3 12/24/2021 1412    Bicarb 33-34  Chest Imaging: None available  Pulmonary Functions Testing Results:     No data to display         HST 09/29/21 with mild OSA AHI 9.5  Echocardiogram:  None available     Assessment & Plan:   # Mild OSA Elevated bicarb may be effect of hctz, alternatively but less likely OHS since her OSA is pretty mild. With her weight loss and improved symptoms on iron supplementation I think we can hold off on CPAP for now.  # Restless legs # Iron  deficiency Improving subjectively with iron supplementation  # Severe obesity  Plan: - ferritin, iron level today - Keep up the good work trying to lose weight - Be on the lookout for profound daytime sleepiness, poor sleep quality, morning headaches, witnessed episodes where you stop breathing at night, gasping for air during the night: if you experience any of these, come back and see [place] we may need to consider CPAP  RTC 3 months     Maryjane Hurter, MD [facility] 02/06/2022 9:54 AM

## 2022-02-06 ENCOUNTER — Encounter: Payer: Self-pay | Admitting: Student

## 2022-02-06 ENCOUNTER — Ambulatory Visit (INDEPENDENT_AMBULATORY_CARE_PROVIDER_SITE_OTHER): Payer: Medicare Other | Admitting: Student

## 2022-02-06 VITALS — BP 122/70 | HR 66 | Temp 98.0°F | Ht 61.0 in | Wt 217.8 lb

## 2022-02-06 DIAGNOSIS — G2581 Restless legs syndrome: Secondary | ICD-10-CM | POA: Diagnosis not present

## 2022-02-06 DIAGNOSIS — E611 Iron deficiency: Secondary | ICD-10-CM | POA: Diagnosis not present

## 2022-02-06 DIAGNOSIS — R5382 Chronic fatigue, unspecified: Secondary | ICD-10-CM | POA: Diagnosis not present

## 2022-02-06 DIAGNOSIS — R7303 Prediabetes: Secondary | ICD-10-CM | POA: Diagnosis not present

## 2022-02-06 DIAGNOSIS — G4733 Obstructive sleep apnea (adult) (pediatric): Secondary | ICD-10-CM | POA: Diagnosis not present

## 2022-02-06 LAB — IBC + FERRITIN
Ferritin: 40.1 ng/mL (ref 10.0–291.0)
Iron: 54 ug/dL (ref 42–145)
Saturation Ratios: 17.4 % — ABNORMAL LOW (ref 20.0–50.0)
TIBC: 310.8 ug/dL (ref 250.0–450.0)
Transferrin: 222 mg/dL (ref 212.0–360.0)

## 2022-02-06 NOTE — Addendum Note (Signed)
Addended by: Suzzanne Cloud E on: 02/06/2022 10:15 AM   Modules accepted: Orders

## 2022-02-06 NOTE — Addendum Note (Signed)
Addended by: Maryjane Hurter on: 02/06/2022 10:11 AM   Modules accepted: Orders

## 2022-02-06 NOTE — Addendum Note (Signed)
Addended by: Suzzanne Cloud E on: 02/06/2022 10:28 AM   Modules accepted: Orders

## 2022-02-06 NOTE — Patient Instructions (Signed)
-   Labs today - Keep up the good work trying to lose weight - Be on the lookout for profound daytime sleepiness, poor sleep quality, morning headaches, witnessed episodes where you stop breathing at night, gasping for air during the night: if you experience any of these, come back and see [place] we may need to consider CPAP

## 2022-02-06 NOTE — Addendum Note (Signed)
Addended by: Rosana Berger on: 02/06/2022 10:15 AM   Modules accepted: Orders

## 2022-02-06 NOTE — Addendum Note (Signed)
Addended by: Suzzanne Cloud E on: 02/06/2022 10:12 AM   Modules accepted: Orders

## 2022-02-19 ENCOUNTER — Other Ambulatory Visit: Payer: Self-pay | Admitting: Family Medicine

## 2022-02-24 ENCOUNTER — Ambulatory Visit (INDEPENDENT_AMBULATORY_CARE_PROVIDER_SITE_OTHER): Payer: Medicare Other | Admitting: Family Medicine

## 2022-02-24 ENCOUNTER — Encounter (INDEPENDENT_AMBULATORY_CARE_PROVIDER_SITE_OTHER): Payer: Self-pay | Admitting: Family Medicine

## 2022-02-24 VITALS — BP 132/68 | HR 66 | Temp 97.6°F | Ht 61.0 in | Wt 213.0 lb

## 2022-02-24 DIAGNOSIS — Z6841 Body Mass Index (BMI) 40.0 and over, adult: Secondary | ICD-10-CM | POA: Diagnosis not present

## 2022-02-24 DIAGNOSIS — F3289 Other specified depressive episodes: Secondary | ICD-10-CM

## 2022-02-24 DIAGNOSIS — I10 Essential (primary) hypertension: Secondary | ICD-10-CM | POA: Diagnosis not present

## 2022-02-24 DIAGNOSIS — Z8639 Personal history of other endocrine, nutritional and metabolic disease: Secondary | ICD-10-CM

## 2022-02-24 DIAGNOSIS — E669 Obesity, unspecified: Secondary | ICD-10-CM

## 2022-02-24 MED ORDER — BUPROPION HCL ER (SR) 150 MG PO TB12: 150.0000 mg | ORAL_TABLET | Freq: Every day | ORAL | 0 refills | Status: DC

## 2022-02-26 NOTE — Progress Notes (Unsigned)
Chief Complaint:   OBESITY Olivia Burns is here to discuss her progress with her obesity treatment plan along with follow-up of her obesity related diagnoses. Olivia Burns is on the Category 1 Plan and states she is following her eating plan approximately 85% of the time. Olivia Burns states she is exercising 0 minutes 0 times per week.  Today's visit was #: 4 Starting weight: 223 lbs Starting date: 12/24/2021 Today's weight: 213 lbs Today's date: 02/24/2022 Total lbs lost to date: 10 lbs Total lbs lost since last in-office visit: 3  Interim History: Olivia Burns has been struggling with sugar cravings. Still eating on plan most days but sometimes unable to get all the foods in. Has a community event coming up that will be a pot-luck event, planning to bring vegetable tray.   Subjective:   1. Essential hypertension Olivia Burns's blood pressure is well controlled. Denies chest pain, chest pressure and headache. She is on Diovan-HCT.  2. History of iron deficiency Olivia Burns is on supplementation. No constipation with iron supplementation.  3. Other depression, with emotional eating Olivia Burns is doing well on Wellbutrin, still some struggles with temptation and cravings. Denies suicidal ideas, and homicidal ideas.  Assessment/Plan:   1. Essential hypertension Continue Diovan without changes in dose or medication.  2. History of iron deficiency Continue iron supplements per PCP. Follow up labs with PCP in November.  3. Other depression, with emotional eating We will refill Wellbutrin SR 150 mg by mouth daily for 1 month with 0 refills.  -Refill buPROPion (WELLBUTRIN SR) 150 MG 12 hr tablet; Take 1 tablet (150 mg total) by mouth daily.  Dispense: 30 tablet; Refill: 0  4. Obesity with current BMI of 40.3 Olivia Burns is currently in the action stage of change. As such, her goal is to continue with weight loss efforts. She has agreed to the Category 2 Plan.  Olivia Burns will move up to Cat 2 plan. Discussed strategies to  deal with cravings.  Exercise goals: No exercise has been prescribed at this time.  Behavioral modification strategies: increasing lean protein intake, meal planning and cooking strategies, keeping healthy foods in the home, better snacking choices, and planning for success.  Olivia Burns has agreed to follow-up with our clinic in 3 weeks. She was informed of the importance of frequent follow-up visits to maximize her success with intensive lifestyle modifications for her multiple health conditions.   Objective:   Blood pressure 132/68, pulse 66, temperature 97.6 F (36.4 C), height '5\' 1"'$  (3.790 m), weight 213 lb (96.6 kg), SpO2 97 %. Body mass index is 40.25 kg/m.  General: Cooperative, alert, well developed, in no acute distress. HEENT: Conjunctivae and lids unremarkable. Cardiovascular: Regular rhythm.  Lungs: Normal work of breathing. Neurologic: No focal deficits.   Lab Results  Component Value Date   CREATININE 0.87 12/24/2021   BUN 13 12/24/2021   NA 141 12/24/2021   K 3.9 12/24/2021   CL 99 12/24/2021   CO2 27 12/24/2021   Lab Results  Component Value Date   ALT 17 12/24/2021   AST 21 12/24/2021   ALKPHOS 91 12/24/2021   BILITOT <0.2 12/24/2021   Lab Results  Component Value Date   HGBA1C 6.1 (H) 12/24/2021   HGBA1C 6.2 07/09/2021   HGBA1C 6.1 05/07/2020   Lab Results  Component Value Date   INSULIN 10.8 12/24/2021   Lab Results  Component Value Date   TSH 2.06 07/09/2021   Lab Results  Component Value Date   CHOL 219 (H) 07/09/2021  HDL 51.00 07/09/2021   LDLCALC 141 (H) 07/09/2021   TRIG 138.0 07/09/2021   CHOLHDL 4 07/09/2021   Lab Results  Component Value Date   VD25OH 70.78 10/07/2021   VD25OH 117.36 (HH) 07/09/2021   Lab Results  Component Value Date   WBC 4.1 12/24/2021   HGB 12.0 12/24/2021   HCT 35.9 12/24/2021   MCV 82 12/24/2021   PLT 284 12/24/2021   Lab Results  Component Value Date   IRON 54 02/06/2022   TIBC 310.8 02/06/2022    FERRITIN 40.1 02/06/2022    Obesity Behavioral Intervention:   Approximately 15 minutes were spent on the discussion below.  ASK: We discussed the diagnosis of obesity with Olivia Burns today and Olivia Burns agreed to give [place] permission to discuss obesity behavioral modification therapy today.  ASSESS: Olivia Burns has the diagnosis of obesity and her BMI today is 40.3. [place] is in the action stage of change.   ADVISE: Olivia Burns was educated on the multiple health risks of obesity as well as the benefit of weight loss to improve her health. She was advised of the need for long term treatment and the importance of lifestyle modifications to improve her current health and to decrease her risk of future health problems.  AGREE: Multiple dietary modification options and treatment options were discussed and Olivia Burns agreed to follow the recommendations documented in the above note.  ARRANGE: Olivia Burns was educated on the importance of frequent visits to treat obesity as outlined per CMS and USPSTF guidelines and agreed to schedule her next follow up appointment today.  Attestation Statements:   Reviewed by clinician on day of visit: allergies, medications, problem list, medical history, surgical history, family history, social history, and previous encounter notes.  I, Elnora Morrison, RMA am acting as transcriptionist for Olivia Common, MD.  I have reviewed the above documentation for accuracy and completeness, and I agree with the above. - Olivia Common, MD

## 2022-03-18 ENCOUNTER — Ambulatory Visit (INDEPENDENT_AMBULATORY_CARE_PROVIDER_SITE_OTHER): Payer: Medicare Other | Admitting: Family Medicine

## 2022-03-18 ENCOUNTER — Encounter (INDEPENDENT_AMBULATORY_CARE_PROVIDER_SITE_OTHER): Payer: Self-pay | Admitting: Family Medicine

## 2022-03-18 VITALS — BP 124/76 | HR 70 | Temp 98.0°F | Ht 61.0 in | Wt 213.0 lb

## 2022-03-18 DIAGNOSIS — E66813 Obesity, class 3: Secondary | ICD-10-CM

## 2022-03-18 DIAGNOSIS — Z6841 Body Mass Index (BMI) 40.0 and over, adult: Secondary | ICD-10-CM | POA: Diagnosis not present

## 2022-03-18 DIAGNOSIS — E669 Obesity, unspecified: Secondary | ICD-10-CM | POA: Diagnosis not present

## 2022-03-18 DIAGNOSIS — I10 Essential (primary) hypertension: Secondary | ICD-10-CM

## 2022-03-18 DIAGNOSIS — F3289 Other specified depressive episodes: Secondary | ICD-10-CM | POA: Diagnosis not present

## 2022-03-18 MED ORDER — BUPROPION HCL ER (SR) 150 MG PO TB12: 150.0000 mg | ORAL_TABLET | Freq: Every day | ORAL | 0 refills | Status: DC

## 2022-03-20 NOTE — Progress Notes (Signed)
Chief Complaint:   OBESITY Olivia Burns is here to discuss her progress with her obesity treatment plan along with follow-up of her obesity related diagnoses. Olivia Burns is on the Category 2 Plan and states she is following her eating plan approximately 85% of the time. Olivia Burns states she is walking some.  Today's visit was #: 5 Starting weight: 223 lbs Starting date: 12/24/2021 Today's weight: 213 lbs Today's date: 03/18/2022 Total lbs lost to date: 10 lbs Total lbs lost since last in-office visit: 0  Interim History: Olivia Burns still working on getting all food in. Sleep has been sporadic and not as plentiful. She voices she goes to bed later and still wakes up early. Breakfast is easy to follow but finds this to be hardest to follow. Wondering what other options she has no upcoming plans for travel or events.  Subjective:   1. Essential hypertension Olivia Burns's blood pressure controlled today. On Diovan-Hct. Denies chest pain, chest pressure and headache.  2. Other depression, with emotional eating [place] on Wellbutrin with good control of cravings. Denies suicidal ideas, and homicidal ideas.  Assessment/Plan:   1. Essential hypertension Continue current medications without changes in dose or medication.  2. Other depression, with emotional eating We will refill Wellbutrin SR 150 mg daily for 3 months with 0 refills.  -Refill buPROPion (WELLBUTRIN SR) 150 MG 12 hr tablet; Take 1 tablet (150 mg total) by mouth daily.  Dispense: 90 tablet; Refill: 0  3. Obesity with current BMI of 40.4 Olivia Burns is currently in the action stage of change. As such, her goal is to continue with weight loss efforts. She has agreed to the Category 2 Plan.   Exercise goals: All adults should avoid inactivity. Some physical activity is better than none, and adults who participate in any amount of physical activity gain some health benefits.  Behavioral modification strategies: increasing lean protein intake, meal  planning and cooking strategies, keeping healthy foods in the home, and planning for success.  Olivia Burns has agreed to follow-up with our clinic in 3 weeks. She was informed of the importance of frequent follow-up visits to maximize her success with intensive lifestyle modifications for her multiple health conditions.   Objective:   Blood pressure 124/76, pulse 70, temperature 98 F (36.7 C), height '5\' 1"'$  (1.549 m), weight 213 lb (96.6 kg), SpO2 98 %. Body mass index is 40.25 kg/m.  General: Cooperative, alert, well developed, in no acute distress. HEENT: Conjunctivae and lids unremarkable. Cardiovascular: Regular rhythm.  Lungs: Normal work of breathing. Neurologic: No focal deficits.   Lab Results  Component Value Date   CREATININE 0.87 12/24/2021   BUN 13 12/24/2021   NA 141 12/24/2021   K 3.9 12/24/2021   CL 99 12/24/2021   CO2 27 12/24/2021   Lab Results  Component Value Date   ALT 17 12/24/2021   AST 21 12/24/2021   ALKPHOS 91 12/24/2021   BILITOT <0.2 12/24/2021   Lab Results  Component Value Date   HGBA1C 6.1 (H) 12/24/2021   HGBA1C 6.2 07/09/2021   HGBA1C 6.1 05/07/2020   Lab Results  Component Value Date   INSULIN 10.8 12/24/2021   Lab Results  Component Value Date   TSH 2.06 07/09/2021   Lab Results  Component Value Date   CHOL 219 (H) 07/09/2021   HDL 51.00 07/09/2021   LDLCALC 141 (H) 07/09/2021   TRIG 138.0 07/09/2021   CHOLHDL 4 07/09/2021   Lab Results  Component Value Date   VD25OH 70.78  10/07/2021   VD25OH 117.36 (HH) 07/09/2021   Lab Results  Component Value Date   WBC 4.1 12/24/2021   HGB 12.0 12/24/2021   HCT 35.9 12/24/2021   MCV 82 12/24/2021   PLT 284 12/24/2021   Lab Results  Component Value Date   IRON 54 02/06/2022   TIBC 310.8 02/06/2022   FERRITIN 40.1 02/06/2022    Obesity Behavioral Intervention:   Approximately 15 minutes were spent on the discussion below.  ASK: We discussed the diagnosis of obesity with  Jacqueleen today and Oriah agreed to give [place] permission to discuss obesity behavioral modification therapy today.  ASSESS: Olivia Burns has the diagnosis of obesity and her BMI today is 40.4. Olivia Burns is in the action stage of change.   ADVISE: Olivia Burns was educated on the multiple health risks of obesity as well as the benefit of weight loss to improve her health. She was advised of the need for long term treatment and the importance of lifestyle modifications to improve her current health and to decrease her risk of future health problems.  AGREE: Multiple dietary modification options and treatment options were discussed and Olivia Burns agreed to follow the recommendations documented in the above note.  ARRANGE: Olivia Burns was educated on the importance of frequent visits to treat obesity as outlined per CMS and USPSTF guidelines and agreed to schedule her next follow up appointment today.  Attestation Statements:   Reviewed by clinician on day of visit: allergies, medications, problem list, medical history, surgical history, family history, social history, and previous encounter notes.  I, Elnora Morrison, RMA am acting as transcriptionist for Coralie Common, MD.  I have reviewed the above documentation for accuracy and completeness, and I agree with the above. - Coralie Common, MD

## 2022-04-08 ENCOUNTER — Ambulatory Visit (INDEPENDENT_AMBULATORY_CARE_PROVIDER_SITE_OTHER): Payer: Medicare Other | Admitting: *Deleted

## 2022-04-08 DIAGNOSIS — Z0000 Encounter for general adult medical examination without abnormal findings: Secondary | ICD-10-CM

## 2022-04-08 NOTE — Progress Notes (Signed)
Subjective:   Olivia Burns is a 71 y.o. female who presents for Medicare Annual (Subsequent) preventive examination.  I connected with  Sae Handrich on 04/08/22 by a audio enabled telemedicine application and verified that I am speaking with the correct person using two identifiers.  Patient Location: Home  Provider Location: Office/Clinic  I discussed the limitations of evaluation and management by telemedicine. The patient expressed understanding and agreed to proceed.   Review of Systems    Defer to PCP Cardiac Risk Factors include: advanced age (>25mn, >>28women);dyslipidemia;hypertension     Objective:    There were no vitals filed for this visit. There is no height or weight on file to calculate BMI.     04/08/2022    9:44 AM 04/04/2021    9:46 AM  Advanced Directives  Does Patient Have a Medical Advance Directive? No No  Would patient like information on creating a medical advance directive? No - Patient declined Yes (MAU/Ambulatory/Procedural Areas - Information given)    Current Medications (verified) Outpatient Encounter Medications as of 04/08/2022  Medication Sig   Ascorbic Acid (VITAMIN C-ROSE HIPS-ACEROLA PO) Take 1 capsule by mouth daily.   b complex vitamins capsule Take 1 capsule by mouth daily.   buPROPion (WELLBUTRIN SR) 150 MG 12 hr tablet Take 1 tablet (150 mg total) by mouth daily.   co-enzyme Q-10 30 MG capsule Take 30 mg by mouth 3 (three) times daily.   diclofenac Sodium (VOLTAREN) 1 % GEL Apply 2 g topically 4 (four) times daily.   Docusate Calcium (STOOL SOFTENER PO) Stool softener   ferrous gluconate (FERGON) 324 MG tablet Take 1 tablet (324 mg total) by mouth daily with breakfast.   fluticasone furoate-vilanterol (BREO ELLIPTA) 100-25 MCG/ACT AEPB Inhale 1 puff into the lungs daily as needed.   linaclotide (LINZESS) 145 MCG CAPS capsule Take 145 mcg by mouth daily before breakfast.   MAGNESIUM GLYCINATE PO Take 400 capsules by mouth  daily.   Menaquinone-7 (VITAMIN K2) 100 MCG CAPS Take 100 mcg by mouth.   milk thistle 175 MG tablet Take 175 mg by mouth daily.   Multiple Vitamins-Minerals (CENTRUM SILVER 50+WOMEN) TABS Take 1 tablet by mouth daily.   omeprazole (PRILOSEC) 20 MG capsule Take 40 mg by mouth daily.   TURMERIC PO Take 1,000 mg by mouth daily.   UNABLE TO FIND Med Name: HTereasa Coop  UNABLE TO FIND Med Name: [place] Cinnamon   valsartan-hydrochlorothiazide (DIOVAN-HCT) 80-12.5 MG tablet TAKE 1 TABLET BY MOUTH EVERY DAY   No facility-administered encounter medications on file as of 04/08/2022.    Allergies (verified) Codeine, Penicillins, and Latex   History: Past Medical History:  Diagnosis Date   Allergy    Anemia    Anxiety    Arthritis    Asthma    Constipation    Depression    GERD (gastroesophageal reflux disease)    Hyperlipidemia    Hypertension    Osteoarthritis    Pre-diabetes    Sleep apnea    Past Surgical History:  Procedure Laterality Date   ABDOMINAL HYSTERECTOMY  2001   EYE SURGERY     HEMORRHOID SURGERY  1975   MENISCUS REPAIR Left 1999   miscarriage  [address]and 177  Family History  Problem Relation Age of Onset   Arthritis Mother    Asthma Mother    Depression Mother    Diabetes Mother    Heart disease Mother    Kidney disease Mother  Arthritis Father    Depression Father    Hyperlipidemia Father    Hypertension Father    Cancer Father        prostate   Cancer Sister        gastric vs pancreatic   Drug abuse Brother    Early death Brother    Social History   Socioeconomic History   Marital status: Single    Spouse name: Not on file   Number of children: Not on file   Years of education: Not on file   Highest education level: Not on file  Occupational History   Not on file  Tobacco Use   Smoking status: Former    Types: Cigarettes   Smokeless tobacco: Never   Tobacco comments:    "Tried smoking"   Vaping Use   Vaping Use: Never used   Substance and Sexual Activity   Alcohol use: Yes    Comment: occasional   Drug use: Never   Sexual activity: Not on file  Other Topics Concern   Not on file  Social History Narrative   Not on file   Social Determinants of Health   Financial Resource Strain: Low Risk  (04/08/2022)   Overall Financial Resource Strain (CARDIA)    Difficulty of Paying Living Expenses: Not hard at all  Food Insecurity: No Food Insecurity (04/08/2022)   Hunger Vital Sign    Worried About Running Out of Food in the Last Year: Never true    Ran Out of Food in the Last Year: Never true  Transportation Needs: No Transportation Needs (04/08/2022)   PRAPARE - Hydrologist (Medical): No    Lack of Transportation (Non-Medical): No  Physical Activity: Sufficiently Active (04/04/2021)   Exercise Vital Sign    Days of Exercise per Week: 7 days    Minutes of Exercise per Session: 30 min  Stress: No Stress Concern Present (04/04/2021)   [place]    Feeling of Stress : Only a little  Social Connections: Socially Isolated (04/04/2021)   Social Connection and Isolation Panel [NHANES]    Frequency of Communication with Friends and Family: More than three times a week    Frequency of Social Gatherings with Friends and Family: More than three times a week    Attends Religious Services: Never    Marine scientist or Organizations: No    Attends Music therapist: Never    Marital Status: Never married    Tobacco Counseling Counseling given: Not Answered Tobacco comments: "Tried smoking"    Clinical Intake:  Pre-visit preparation completed: Yes  Pain : No/denies pain  Diabetes: No  How often do you need to have someone help you when you read instructions, pamphlets, or other written materials from your doctor or pharmacy?: 1 - Never   Activities of Daily Living    04/08/2022    9:45 AM  In your  present state of health, do you have any difficulty performing the following activities:  Hearing? 0  Vision? 0  Difficulty concentrating or making decisions? 0  Walking or climbing stairs? 0  Dressing or bathing? 0  Doing errands, shopping? 0  Preparing Food and eating ? N  Using the Toilet? N  In the past six months, have you accidently leaked urine? N  Do you have problems with loss of bowel control? N  Managing your Medications? N  Managing your Finances? N  Housekeeping  or managing your Housekeeping? N    Patient Care Team: Shelda Pal, DO as PCP - General (Family Medicine)  Indicate any recent Medical Services you may have received from other than [facility] providers in the past year (date may be approximate).     Assessment:   This is a routine wellness examination for [place].  Hearing/Vision screen No results found.  Dietary issues and exercise activities discussed: Current Exercise Habits: Home exercise routine, Type of exercise: yoga;walking, Time (Minutes): 30, Frequency (Times/Week): 7 (weather permitting), Weekly Exercise (Minutes/Week): 210, Intensity: Mild, Exercise limited by: None identified   Goals Addressed   None    Depression Screen    04/08/2022    9:44 AM 12/24/2021    9:06 AM 04/04/2021    9:51 AM  PHQ 2/9 Scores  PHQ - 2 Score 2 4 0  PHQ- 9 Score  11     Fall Risk    04/08/2022    9:44 AM 04/04/2021    9:49 AM 12/30/2017    4:41 PM  Fall Risk   Falls in the past year? 0 1 No  Comment   Emmi Telephone Survey: data to providers prior to load  Number falls in past yr: 0 0   Injury with Fall? 0 0   Risk for fall due to : No Fall Risks History of fall(s)   Follow up Falls evaluation completed Falls prevention discussed     [place]:  Any stairs in or around the home? No  If so, are there any without handrails?  No stairs Home free of loose throw rugs in walkways, pet beds, electrical cords, etc?  Yes  Adequate lighting in your home to reduce risk of falls? Yes   ASSISTIVE DEVICES UTILIZED TO PREVENT FALLS:  Life alert? No  Use of a cane, walker or w/c? No  Grab bars in the bathroom? No  Shower chair or bench in shower? No  Elevated toilet seat or a handicapped toilet? Yes   TIMED UP AND GO:  Was the test performed?  No, audio visit .    Cognitive Function:        04/08/2022    9:49 AM  6CIT Screen  What Year? 0 points  What month? 0 points  What time? 0 points  Count back from 20 0 points  Months in reverse 0 points  Repeat phrase 0 points  Total Score 0 points    Immunizations Immunization History  Administered Date(s) Administered   Influenza, High Dose Seasonal PF 03/07/2020   Influenza-Unspecified 03/02/2018, 03/04/2021   PFIZER(Purple Top)SARS-COV-2 Vaccination 07/16/2019, 08/10/2019, 03/09/2020, 11/01/2020   PNEUMOCOCCAL CONJUGATE-20 11/01/2020   Pfizer Covid-19 Vaccine Bivalent Booster 68yr & up 02/26/2021    TDAP status: Due, Education has been provided regarding the importance of this vaccine. Advised may receive this vaccine at local pharmacy or Health Dept. Aware to provide a copy of the vaccination record if obtained from local pharmacy or Health Dept. Verbalized acceptance and understanding.  Flu Vaccine status: Up to date  Pneumococcal vaccine status: Up to date  Covid-19 vaccine status: Information provided on how to obtain vaccines.   Qualifies for Shingles Vaccine? Yes   Zostavax completed No   Shingrix Completed?: No.    Education has been provided regarding the importance of this vaccine. Patient has been advised to call insurance company to determine out of pocket expense if they have not yet received this vaccine. Advised may also receive vaccine  at local pharmacy or Health Dept. Verbalized acceptance and understanding.  Screening Tests Health Maintenance  Topic Date Due   COVID-19 Vaccine (6 - Pfizer series) 06/28/2021   Medicare  Annual Wellness (AWV)  04/04/2022   Zoster Vaccines- Shingrix (1 of 2) 04/09/2022 (Originally 04/15/2000)   MAMMOGRAM  07/23/2023   COLONOSCOPY (Pts 45-62yr Insurance coverage will need to be confirmed)  10/07/2028   Pneumonia Vaccine 72 Years old  Completed   INFLUENZA VACCINE  Completed   DEXA SCAN  Completed   Hepatitis C Screening  Completed   HPV VACCINES  Aged Out   TETANUS/TDAP  Discontinued    Health Maintenance  Health Maintenance Due  Topic Date Due   COVID-19 Vaccine (6 - Pfizer series) 06/28/2021   Medicare Annual Wellness (AWV)  04/04/2022    Colorectal cancer screening: Type of screening: Colonoscopy. Completed 10/08/18. Repeat every 10 years  Mammogram status: Completed 07/22/21. Repeat every year  Bone Density status: Completed 04/11/21. Results reflect: Bone density results: NORMAL. Repeat every 2 years.  Lung Cancer Screening: (Low Dose CT Chest recommended if Age 72-80years, 30 pack-year currently smoking OR have quit w/in 15years.) does not qualify.   Lung Cancer Screening Referral: N/a  Additional Screening:  Hepatitis C Screening: does qualify; Completed 10/07/21  Vision Screening: Recommended annual ophthalmology exams for early detection of glaucoma and other disorders of the eye. Is the patient up to date with their annual eye exam?  Yes  Who is the provider or what is the name of the office in which the patient attends annual eye exams? Dr. MEinar GipIf pt is not established with a provider, would they like to be referred to a provider to establish care? No .   Dental Screening: Recommended annual dental exams for proper oral hygiene  Community Resource Referral / Chronic Care Management: CRR required this visit?  No   CCM required this visit?  No      Plan:     I have personally reviewed and noted the following in the patient's chart:   Medical and social history Use of alcohol, tobacco or illicit drugs  Current medications and  supplements including opioid prescriptions. Patient is not currently taking opioid prescriptions. Functional ability and status Nutritional status Physical activity Advanced directives List of other physicians Hospitalizations, surgeries, and ER visits in previous 12 months Vitals Screenings to include cognitive, depression, and falls Referrals and appointments  In addition, I have reviewed and discussed with patient certain preventive protocols, quality metrics, and best practice recommendations. A written personalized care plan for preventive services as well as general preventive health recommendations were provided to patient.   Due to this being a telephonic visit, the after visit summary with patients personalized plan was offered to patient via mail or my-chart.  Patient would like to access on my-chart.  BBeatris Ship [place]  04/08/2022   Nurse Notes: None

## 2022-04-08 NOTE — Patient Instructions (Signed)
[name] , Thank you for taking time to come for your Medicare Wellness Visit. I appreciate your ongoing commitment to your health goals. Please review the following plan we discussed and let me know if I can assist you in the future.   These are the goals we discussed:  Goals      Patient Stated     Eat healthier, drink more water & increase activity        This is a list of the screening recommended for you and due dates:  Health Maintenance  Topic Date Due   COVID-19 Vaccine (6 - Pfizer series) 06/28/2021   Zoster (Shingles) Vaccine (1 of 2) 04/09/2022*   Medicare Annual Wellness Visit  04/09/2023   Mammogram  07/23/2023   Colon Cancer Screening  10/07/2028   Pneumonia Vaccine  Completed   Flu Shot  Completed   DEXA scan (bone density measurement)  Completed   Hepatitis C Screening: USPSTF Recommendation to screen - Ages 37-79 yo.  Completed   HPV Vaccine  Aged Out   Tetanus Vaccine  Discontinued  *Topic was postponed. The date shown is not the original due date.     Next appointment: Follow up in one year for your annual wellness visit.   Preventive Care 44 Years and Older, Female Preventive care refers to lifestyle choices and visits with your health care provider that can promote health and wellness. What does preventive care include? A yearly physical exam. This is also called an annual well check. Dental exams once or twice a year. Routine eye exams. Ask your health care provider how often you should have your eyes checked. Personal lifestyle choices, including: Daily care of your teeth and gums. Regular physical activity. Eating a healthy diet. Avoiding tobacco and drug use. Limiting alcohol use. Practicing safe sex. Taking low-dose aspirin every day. Taking vitamin and mineral supplements as recommended by your health care provider. What happens during an annual well check? The services and screenings done by your health care provider during your annual  well check will depend on your age, overall health, lifestyle risk factors, and family history of disease. Counseling  Your health care provider may ask you questions about your: Alcohol use. Tobacco use. Drug use. Emotional well-being. Home and relationship well-being. Sexual activity. Eating habits. History of falls. Memory and ability to understand (cognition). Work and work Statistician. Reproductive health. Screening  You may have the following tests or measurements: Height, weight, and BMI. Blood pressure. Lipid and cholesterol levels. These may be checked every 5 years, or more frequently if you are over 67 years old. Skin check. Lung cancer screening. You may have this screening every year starting at age 63 if you have a 30-pack-year history of smoking and currently smoke or have quit within the past 15 years. Fecal occult blood test (FOBT) of the stool. You may have this test every year starting at age 93. Flexible sigmoidoscopy or colonoscopy. You may have a sigmoidoscopy every 5 years or a colonoscopy every 10 years starting at age 72. Hepatitis C blood test. Hepatitis B blood test. Sexually transmitted disease (STD) testing. Diabetes screening. This is done by checking your blood sugar (glucose) after you have not eaten for a while (fasting). You may have this done every 1-3 years. Bone density scan. This is done to screen for osteoporosis. You may have this done starting at age 72. Mammogram. This may be done every 1-2 years. Talk to your health care provider about how often  you should have regular mammograms. Talk with your health care provider about your test results, treatment options, and if necessary, the need for more tests. Vaccines  Your health care provider may recommend certain vaccines, such as: Influenza vaccine. This is recommended every year. Tetanus, diphtheria, and acellular pertussis (Tdap, Td) vaccine. You may need a Td booster every 10 years. Zoster  vaccine. You may need this after age 72. Pneumococcal 13-valent conjugate (PCV13) vaccine. One dose is recommended after age 72. Pneumococcal polysaccharide (PPSV23) vaccine. One dose is recommended after age 74. Talk to your health care provider about which screenings and vaccines you need and how often you need them. This information is not intended to replace advice given to you by your health care provider. Make sure you discuss any questions you have with your health care provider. Document Released: 06/15/2015 Document Revised: 02/06/2016 Document Reviewed: 03/20/2015 Elsevier Interactive Patient Education  2017 [employer] Prevention in the Home Falls can cause injuries. They can happen to people of all ages. There are many things you can do to make your home safe and to help prevent falls. What can I do on the outside of my home? Regularly fix the edges of walkways and driveways and fix any cracks. Remove anything that might make you trip as you walk through a door, such as a raised step or threshold. Trim any bushes or trees on the path to your home. Use bright outdoor lighting. Clear any walking paths of anything that might make someone trip, such as rocks or tools. Regularly check to see if handrails are loose or broken. Make sure that both sides of any steps have handrails. Any raised decks and porches should have guardrails on the edges. Have any leaves, snow, or ice cleared regularly. Use sand or salt on walking paths during winter. Clean up any spills in your garage right away. This includes oil or grease spills. What can I do in the bathroom? Use night lights. Install grab bars by the toilet and in the tub and shower. Do not use towel bars as grab bars. Use non-skid mats or decals in the tub or shower. If you need to sit down in the shower, use a plastic, non-slip stool. Keep the floor dry. Clean up any water that spills on the floor as soon as it happens. Remove  soap buildup in the tub or shower regularly. Attach bath mats securely with double-sided non-slip rug tape. Do not have throw rugs and other things on the floor that can make you trip. What can I do in the bedroom? Use night lights. Make sure that you have a light by your bed that is easy to reach. Do not use any sheets or blankets that are too big for your bed. They should not hang down onto the floor. Have a firm chair that has side arms. You can use this for support while you get dressed. Do not have throw rugs and other things on the floor that can make you trip. What can I do in the kitchen? Clean up any spills right away. Avoid walking on wet floors. Keep items that you use a lot in easy-to-reach places. If you need to reach something above you, use a strong step stool that has a grab bar. Keep electrical cords out of the way. Do not use floor polish or wax that makes floors slippery. If you must use wax, use non-skid floor wax. Do not have throw rugs and other things on  the floor that can make you trip. What can I do with my stairs? Do not leave any items on the stairs. Make sure that there are handrails on both sides of the stairs and use them. Fix handrails that are broken or loose. Make sure that handrails are as long as the stairways. Check any carpeting to make sure that it is firmly attached to the stairs. Fix any carpet that is loose or worn. Avoid having throw rugs at the top or bottom of the stairs. If you do have throw rugs, attach them to the floor with carpet tape. Make sure that you have a light switch at the top of the stairs and the bottom of the stairs. If you do not have them, ask someone to add them for you. What else can I do to help prevent falls? Wear shoes that: Do not have high heels. Have rubber bottoms. Are comfortable and fit you well. Are closed at the toe. Do not wear sandals. If you use a stepladder: Make sure that it is fully opened. Do not climb a  closed stepladder. Make sure that both sides of the stepladder are locked into place. Ask someone to hold it for you, if possible. Clearly mark and make sure that you can see: Any grab bars or handrails. First and last steps. Where the edge of each step is. Use tools that help you move around (mobility aids) if they are needed. These include: Canes. Walkers. Scooters. Crutches. Turn on the lights when you go into a dark area. Replace any light bulbs as soon as they burn out. Set up your furniture so you have a clear path. Avoid moving your furniture around. If any of your floors are uneven, fix them. If there are any pets around you, be aware of where they are. Review your medicines with your doctor. Some medicines can make you feel dizzy. This can increase your chance of falling. Ask your doctor what other things that you can do to help prevent falls. This information is not intended to replace advice given to you by your health care provider. Make sure you discuss any questions you have with your health care provider. Document Released: 03/15/2009 Document Revised: 10/25/2015 Document Reviewed: 06/23/2014 Elsevier Interactive Patient Education  2017 [employer].

## 2022-04-09 ENCOUNTER — Encounter: Payer: Self-pay | Admitting: Family Medicine

## 2022-04-09 ENCOUNTER — Ambulatory Visit (INDEPENDENT_AMBULATORY_CARE_PROVIDER_SITE_OTHER): Payer: Medicare Other | Admitting: Family Medicine

## 2022-04-09 VITALS — BP 120/83 | HR 63 | Temp 98.2°F | Ht 61.0 in | Wt 213.1 lb

## 2022-04-09 DIAGNOSIS — I10 Essential (primary) hypertension: Secondary | ICD-10-CM | POA: Diagnosis not present

## 2022-04-09 NOTE — Progress Notes (Signed)
Chief Complaint  Patient presents with   Follow-up    Subjective Olivia Burns is a 71 y.o. female who presents for hypertension follow up. She does not monitor home blood pressures. She is compliant with medications- Diovan HCT- 80-12.5 mg/d. Patient has these side effects of medication: none She is usually adhering to a healthy diet overall. Current exercise: walking No Cp or SOB.    Past Medical History:  Diagnosis Date   Allergy    Anemia    Anxiety    Arthritis    Asthma    Constipation    Depression    GERD (gastroesophageal reflux disease)    Hyperlipidemia    Hypertension    Osteoarthritis    Pre-diabetes    Sleep apnea     Exam BP 120/83 (BP Location: Left Arm, Patient Position: Sitting, Cuff Size: Normal)   Pulse 63   Temp 98.2 F (36.8 C) (Oral)   Ht '5\' 1"'$  (1.549 m)   Wt 213 lb 2 oz (96.7 kg)   SpO2 98%   BMI 40.27 kg/m  General:  well developed, well nourished, in no apparent distress Heart: RRR, no bruits, no LE edema Lungs: clear to auscultation, no accessory muscle use Psych: well oriented with normal range of affect and appropriate judgment/insight  Essential hypertension  Chronic, stable. Cont Diovan HCT- 80-12.5 mg/d. Counseled on diet and exercise. F/u in 6 mo or prn. The patient voiced understanding and agreement to the plan.  [place], DO 04/09/22  9:36 AM

## 2022-04-09 NOTE — Patient Instructions (Signed)
Keep the diet clean and stay active.  Let us know if you need anything. 

## 2022-04-10 ENCOUNTER — Ambulatory Visit (INDEPENDENT_AMBULATORY_CARE_PROVIDER_SITE_OTHER): Payer: Medicare Other | Admitting: Family Medicine

## 2022-04-10 ENCOUNTER — Encounter (INDEPENDENT_AMBULATORY_CARE_PROVIDER_SITE_OTHER): Payer: Self-pay | Admitting: Family Medicine

## 2022-04-10 VITALS — BP 118/62 | HR 63 | Temp 98.2°F | Ht 61.0 in | Wt 210.0 lb

## 2022-04-10 DIAGNOSIS — F3289 Other specified depressive episodes: Secondary | ICD-10-CM | POA: Diagnosis not present

## 2022-04-10 DIAGNOSIS — Z6839 Body mass index (BMI) 39.0-39.9, adult: Secondary | ICD-10-CM | POA: Diagnosis not present

## 2022-04-10 DIAGNOSIS — E669 Obesity, unspecified: Secondary | ICD-10-CM

## 2022-04-16 NOTE — Progress Notes (Signed)
Chief Complaint:   OBESITY Olivia Burns is here to discuss her progress with her obesity treatment plan along with follow-up of her obesity related diagnoses. Olivia Burns is on the Category 2 Plan and states she is following her eating plan approximately 80% of the time. Olivia Burns states she is walking 20 minutes 7 times per week.  Today's visit was #: 6 Starting weight: 223 lbs Starting date: 12/24/2021 Today's weight: 210 lbs Today's date: 04/10/2022 Total lbs lost to date: 13 lbs Total lbs lost since last in-office visit: 3  Interim History: Olivia Burns saw PCP yesterday. Sometimes she is leaving off bread and doing protein, vegetables, fruit at most meals. Able to get at least 6 oz in at supper. Doing Yasso bar for snack,+ cheese + crackers. Does not know what she is doing for Thanksgiving. Recognizes she can indulge on Thanksgiving.  Subjective:   1. Other depression, with emotional eating Olivia Burns noticing some trembling occasionally with thumb when picking objects up. Denies suicidal ideas, and homicidal ideas. Blood pressure controlled today.  Assessment/Plan:   1. Other depression, with emotional eating Follow up on symptoms at next appointment.  2. Obesity with current BMI of 39.7 Olivia Burns is currently in the action stage of change. As such, her goal is to continue with weight loss efforts. She has agreed to the Category 2 Plan.   Exercise goals: All adults should avoid inactivity. Some physical activity is better than none, and adults who participate in any amount of physical activity gain some health benefits.  We discussed implement of strict consistent activity at next appointment.  Behavioral modification strategies: increasing lean protein intake, meal planning and cooking strategies, keeping healthy foods in the home, and planning for success.  Olivia Burns has agreed to follow-up with our clinic in 3 weeks. She was informed of the importance of frequent follow-up visits to maximize her  success with intensive lifestyle modifications for her multiple health conditions.   Objective:   Blood pressure 118/62, pulse 63, temperature 98.2 F (36.8 C), height '5\' 1"'$  (1.549 m), weight 210 lb (95.3 kg), SpO2 98 %. Body mass index is 39.68 kg/m.  General: Cooperative, alert, well developed, in no acute distress. HEENT: Conjunctivae and lids unremarkable. Cardiovascular: Regular rhythm.  Lungs: Normal work of breathing. Neurologic: No focal deficits.   Lab Results  Component Value Date   CREATININE 0.87 12/24/2021   BUN 13 12/24/2021   NA 141 12/24/2021   K 3.9 12/24/2021   CL 99 12/24/2021   CO2 27 12/24/2021   Lab Results  Component Value Date   ALT 17 12/24/2021   AST 21 12/24/2021   ALKPHOS 91 12/24/2021   BILITOT <0.2 12/24/2021   Lab Results  Component Value Date   HGBA1C 6.1 (H) 12/24/2021   HGBA1C 6.2 07/09/2021   HGBA1C 6.1 05/07/2020   Lab Results  Component Value Date   INSULIN 10.8 12/24/2021   Lab Results  Component Value Date   TSH 2.06 07/09/2021   Lab Results  Component Value Date   CHOL 219 (H) 07/09/2021   HDL 51.00 07/09/2021   LDLCALC 141 (H) 07/09/2021   TRIG 138.0 07/09/2021   CHOLHDL 4 07/09/2021   Lab Results  Component Value Date   VD25OH 70.78 10/07/2021   VD25OH 117.36 (HH) 07/09/2021   Lab Results  Component Value Date   WBC 4.1 12/24/2021   HGB 12.0 12/24/2021   HCT 35.9 12/24/2021   MCV 82 12/24/2021   PLT 284 12/24/2021   Lab  Results  Component Value Date   IRON 54 02/06/2022   TIBC 310.8 02/06/2022   FERRITIN 40.1 02/06/2022   Attestation Statements:   Reviewed by clinician on day of visit: allergies, medications, problem list, medical history, surgical history, family history, social history, and previous encounter notes.  I, Elnora Morrison, RMA am acting as transcriptionist for Coralie Common, MD.  I have reviewed the above documentation for accuracy and completeness, and I agree with the above. -  Coralie Common, MD

## 2022-05-01 ENCOUNTER — Encounter (INDEPENDENT_AMBULATORY_CARE_PROVIDER_SITE_OTHER): Payer: Self-pay | Admitting: Family Medicine

## 2022-05-01 ENCOUNTER — Ambulatory Visit (INDEPENDENT_AMBULATORY_CARE_PROVIDER_SITE_OTHER): Payer: Medicare Other | Admitting: Family Medicine

## 2022-05-01 ENCOUNTER — Other Ambulatory Visit: Payer: Self-pay | Admitting: Student

## 2022-05-01 VITALS — BP 124/81 | HR 69 | Temp 97.7°F | Ht 61.0 in | Wt 208.0 lb

## 2022-05-01 DIAGNOSIS — F3289 Other specified depressive episodes: Secondary | ICD-10-CM

## 2022-05-01 DIAGNOSIS — Z6839 Body mass index (BMI) 39.0-39.9, adult: Secondary | ICD-10-CM

## 2022-05-01 DIAGNOSIS — E669 Obesity, unspecified: Secondary | ICD-10-CM

## 2022-05-01 DIAGNOSIS — I10 Essential (primary) hypertension: Secondary | ICD-10-CM

## 2022-05-15 NOTE — Progress Notes (Signed)
Chief Complaint:   OBESITY Olivia Burns is here to discuss her progress with her obesity treatment plan along with follow-up of her obesity related diagnoses. Olivia Burns is on the Category 2 Plan and states she is following her eating plan approximately 50% of the time. Olivia Burns states she is walking the dog 15-20 minutes 7 times per week.  Today's visit was #: 7 Starting weight: 223 lbs Starting date: 12/24/2021 Today's weight: 208 lbs Today's date: 05/01/2022 Total lbs lost to date: 15 lbs Total lbs lost since last in-office visit: 2  Interim History: Olivia Burns had a good Thanksgiving. She has gotten back to plan as best as she can. She is eating at 12 pm. Olivia Burns is chicken or [place] and another vegetable and fruit. Snack is popcorn or Yasso bar. Some days she skips breakfast. She has a cookie swap and cake walk in December but that's it.  Subjective:   1. Essential hypertension Olivia Burns's blood pressure is well controlled today. Denies chest pain, chest pressure and headache.  2. Other depression Significant control of cravings with Wellbutrin. Denies suicidal ideas, and homicidal ideas.  Assessment/Plan:   1. Essential hypertension Continue Diovan--HCT.  2. Other depression Continue Wellbutrin with no refills needed today.  3. Obesity with current BMI of 39.3 Olivia Burns is currently in the action stage of change. As such, her goal is to continue with weight loss efforts. She has agreed to the Category 2 Plan.   Exercise goals: All adults should avoid inactivity. Some physical activity is better than none, and adults who participate in any amount of physical activity gain some health benefits.  Olivia Burns is to start making a plan for 3-4 times a week of activity for 15 minutes.  Behavioral modification strategies: increasing lean protein intake, no skipping meals, meal planning and cooking strategies, and keeping healthy foods in the home.  Olivia Burns has agreed to follow-up with our clinic in  3 weeks. She was informed of the importance of frequent follow-up visits to maximize her success with intensive lifestyle modifications for her multiple health conditions.   Objective:   Blood pressure 124/81, pulse 69, temperature 97.7 F (36.5 C), height '5\' 1"'$  (1.549 m), weight 208 lb (94.3 kg), SpO2 95 %. Body mass index is 39.3 kg/m.  General: Cooperative, alert, well developed, in no acute distress. HEENT: Conjunctivae and lids unremarkable. Cardiovascular: Regular rhythm.  Lungs: Normal work of breathing. Neurologic: No focal deficits.   Lab Results  Component Value Date   CREATININE 0.87 12/24/2021   BUN 13 12/24/2021   NA 141 12/24/2021   K 3.9 12/24/2021   CL 99 12/24/2021   CO2 27 12/24/2021   Lab Results  Component Value Date   ALT 17 12/24/2021   AST 21 12/24/2021   ALKPHOS 91 12/24/2021   BILITOT <0.2 12/24/2021   Lab Results  Component Value Date   HGBA1C 6.1 (H) 12/24/2021   HGBA1C 6.2 07/09/2021   HGBA1C 6.1 05/07/2020   Lab Results  Component Value Date   INSULIN 10.8 12/24/2021   Lab Results  Component Value Date   TSH 2.06 07/09/2021   Lab Results  Component Value Date   CHOL 219 (H) 07/09/2021   HDL 51.00 07/09/2021   LDLCALC 141 (H) 07/09/2021   TRIG 138.0 07/09/2021   CHOLHDL 4 07/09/2021   Lab Results  Component Value Date   VD25OH 70.78 10/07/2021   VD25OH 117.36 (HH) 07/09/2021   Lab Results  Component Value Date   WBC 4.1 12/24/2021  HGB 12.0 12/24/2021   HCT 35.9 12/24/2021   MCV 82 12/24/2021   PLT 284 12/24/2021   Lab Results  Component Value Date   IRON 54 02/06/2022   TIBC 310.8 02/06/2022   FERRITIN 40.1 02/06/2022   Attestation Statements:   Reviewed by clinician on day of visit: allergies, medications, problem list, medical history, surgical history, family history, social history, and previous encounter notes.  I, Elnora Morrison, RMA am acting as transcriptionist for Coralie Common, MD.  I have  reviewed the above documentation for accuracy and completeness, and I agree with the above. - Coralie Common, MD

## 2022-05-22 ENCOUNTER — Ambulatory Visit (INDEPENDENT_AMBULATORY_CARE_PROVIDER_SITE_OTHER): Payer: Medicare Other | Admitting: Family Medicine

## 2022-05-22 ENCOUNTER — Encounter (INDEPENDENT_AMBULATORY_CARE_PROVIDER_SITE_OTHER): Payer: Self-pay | Admitting: Family Medicine

## 2022-05-22 VITALS — BP 143/75 | HR 66 | Temp 98.6°F | Ht 61.0 in | Wt 207.0 lb

## 2022-05-22 DIAGNOSIS — Z6839 Body mass index (BMI) 39.0-39.9, adult: Secondary | ICD-10-CM | POA: Diagnosis not present

## 2022-05-22 DIAGNOSIS — I10 Essential (primary) hypertension: Secondary | ICD-10-CM | POA: Diagnosis not present

## 2022-05-22 DIAGNOSIS — E669 Obesity, unspecified: Secondary | ICD-10-CM | POA: Diagnosis not present

## 2022-06-09 NOTE — Progress Notes (Signed)
Chief Complaint:   OBESITY Olivia Burns is here to discuss her progress with her obesity treatment plan along with follow-up of her obesity related diagnoses. Olivia Burns is on the Category 2 Plan and states she is following her eating plan approximately 45-50% of the time. Olivia Burns states she is walking 40 minutes 7 times per week.  Today's visit was #: 8 Starting weight: 223 lbs Starting date: 12/24/2021 Today's weight: 207 lbs Today's date: 05/22/2022 Total lbs lost to date: 16 lbs Total lbs lost since last in-office visit: 1  Interim History: Olivia Burns is just trying to get through this holiday season.  Was starting to get stressed with all of the to do's.  She is planning to finish baking in the next week.  Will be going to eat at [employer] on Christmas day.  Recognizes portions has been off.  Subjective:   1. Essential hypertension Sharma's blood pressure is slightly elevated today. Taking Diovan-Hct. Denies chest pain, chest pressure and headache.  Assessment/Plan:   1. Essential hypertension Continue current medications without any changes and do not.  2. Obesity with current BMI of 39.3 [name] is currently in the action stage of change. As such, her goal is to continue with weight loss efforts. She has agreed to the Category 2 Plan.   Exercise goals: All adults should avoid inactivity. Some physical activity is better than none, and adults who participate in any amount of physical activity gain some health benefits.  Olivia Burns is to start creating a resistance activity plan.  Behavioral modification strategies: increasing lean protein intake, meal planning and cooking strategies, keeping healthy foods in the home, holiday eating strategies , and planning for success.  Olivia Burns has agreed to follow-up with our clinic in 4 weeks. She was informed of the importance of frequent follow-up visits to maximize her success with intensive lifestyle modifications for her multiple health  conditions.   Objective:   Blood pressure (!) 143/75, pulse 66, temperature 98.6 F (37 C), height '5\' 1"'$  (1.549 m), weight 207 lb (93.9 kg), SpO2 97 %. Body mass index is 39.11 kg/m.  General: Cooperative, alert, well developed, in no acute distress. HEENT: Conjunctivae and lids unremarkable. Cardiovascular: Regular rhythm.  Lungs: Normal work of breathing. Neurologic: No focal deficits.   Lab Results  Component Value Date   CREATININE 0.87 12/24/2021   BUN 13 12/24/2021   NA 141 12/24/2021   K 3.9 12/24/2021   CL 99 12/24/2021   CO2 27 12/24/2021   Lab Results  Component Value Date   ALT 17 12/24/2021   AST 21 12/24/2021   ALKPHOS 91 12/24/2021   BILITOT <0.2 12/24/2021   Lab Results  Component Value Date   HGBA1C 6.1 (H) 12/24/2021   HGBA1C 6.2 07/09/2021   HGBA1C 6.1 05/07/2020   Lab Results  Component Value Date   INSULIN 10.8 12/24/2021   Lab Results  Component Value Date   TSH 2.06 07/09/2021   Lab Results  Component Value Date   CHOL 219 (H) 07/09/2021   HDL 51.00 07/09/2021   LDLCALC 141 (H) 07/09/2021   TRIG 138.0 07/09/2021   CHOLHDL 4 07/09/2021   Lab Results  Component Value Date   VD25OH 70.78 10/07/2021   VD25OH 117.36 (HH) 07/09/2021   Lab Results  Component Value Date   WBC 4.1 12/24/2021   HGB 12.0 12/24/2021   HCT 35.9 12/24/2021   MCV 82 12/24/2021   PLT 284 12/24/2021   Lab Results  Component Value Date  IRON 54 02/06/2022   TIBC 310.8 02/06/2022   FERRITIN 40.1 02/06/2022   Attestation Statements:   Reviewed by clinician on day of visit: allergies, medications, problem list, medical history, surgical history, family history, social history, and previous encounter notes.  I, Elnora Morrison, RMA am acting as transcriptionist for Coralie Common, MD.  I have reviewed the above documentation for accuracy and completeness, and I agree with the above. - Coralie Common, MD

## 2022-06-23 ENCOUNTER — Encounter (INDEPENDENT_AMBULATORY_CARE_PROVIDER_SITE_OTHER): Payer: Self-pay | Admitting: Family Medicine

## 2022-06-23 ENCOUNTER — Ambulatory Visit (INDEPENDENT_AMBULATORY_CARE_PROVIDER_SITE_OTHER): Payer: Medicare Other | Admitting: Family Medicine

## 2022-06-23 VITALS — BP 140/64 | HR 78 | Temp 98.9°F | Ht 61.0 in | Wt 207.0 lb

## 2022-06-23 DIAGNOSIS — E669 Obesity, unspecified: Secondary | ICD-10-CM

## 2022-06-23 DIAGNOSIS — Z6839 Body mass index (BMI) 39.0-39.9, adult: Secondary | ICD-10-CM | POA: Diagnosis not present

## 2022-06-23 DIAGNOSIS — F3289 Other specified depressive episodes: Secondary | ICD-10-CM | POA: Diagnosis not present

## 2022-06-23 DIAGNOSIS — I10 Essential (primary) hypertension: Secondary | ICD-10-CM | POA: Diagnosis not present

## 2022-06-23 MED ORDER — BUPROPION HCL ER (SR) 150 MG PO TB12: 150.0000 mg | ORAL_TABLET | Freq: Every day | ORAL | 0 refills | Status: DC

## 2022-07-02 NOTE — Progress Notes (Signed)
Chief Complaint:   OBESITY Olivia Burns is here to discuss her progress with her obesity treatment plan along with follow-up of her obesity related diagnoses. Olivia Burns is on the Category 2 Plan and states she is following her eating plan approximately 80% of the time. Olivia Burns states she is walking the dog 20-40 minutes 7 times per week.  Today's visit was #: 9 Starting weight: 223 lbs Starting date: 12/24/2021 Today's weight: 207 lbs Today's date: 06/23/2022 Total lbs lost to date: 16 lbs Total lbs lost since last in-office visit: 0  Interim History: Olivia Burns had a pretty good holiday season.  Trying to learn more towards protein and getting that in.  She also tries to stop eating at 8 PM.  Traveling is done for now, she is really ready to commit to e meal plan.  Subjective:   1. Essential hypertension Blood pressure minimally elevated.  On Diovan-HCTZ.  Denies chest pain, chest pressure and headache.  2. Other depression, with emotional eating Denies suicidal ideas, and homicidal ideas.  Some hand shaking noted.  Helping to control cravings.  Assessment/Plan:   1. Essential hypertension Follow-up blood pressure at next appointment-if elevated will discuss change in medication.  2. Other depression, with emotional eating Will refill Wellbutrin SR 150 mg by mouth daily for 1 month with 0 refills.  -Refill buPROPion (WELLBUTRIN SR) 150 MG 12 hr tablet; Take 1 tablet (150 mg total) by mouth daily.  Dispense: 90 tablet; Refill: 0  3. Obesity with current BMI of 39.1 Olivia Burns is currently in the action stage of change. As such, her goal is to continue with weight loss efforts. She has agreed to the Category 2 Plan.   Exercise goals: Older adults should follow the adult guidelines. When older adults cannot meet the adult guidelines, they should be as physically active as their abilities and conditions will allow.   Behavioral modification strategies: increasing lean protein intake, meal  planning and cooking strategies, keeping healthy foods in the home, and planning for success.  Olivia Burns has agreed to follow-up with our clinic in 4 weeks. She was informed of the importance of frequent follow-up visits to maximize her success with intensive lifestyle modifications for her multiple health conditions.   Objective:   Blood pressure (!) 140/64, pulse 78, temperature 98.9 F (37.2 C), height '5\' 1"'$  (1.549 m), weight 207 lb (93.9 kg), SpO2 98 %. Body mass index is 39.11 kg/m.  General: Cooperative, alert, well developed, in no acute distress. HEENT: Conjunctivae and lids unremarkable. Cardiovascular: Regular rhythm.  Lungs: Normal work of breathing. Neurologic: No focal deficits.   Lab Results  Component Value Date   CREATININE 0.87 12/24/2021   BUN 13 12/24/2021   NA 141 12/24/2021   K 3.9 12/24/2021   CL 99 12/24/2021   CO2 27 12/24/2021   Lab Results  Component Value Date   ALT 17 12/24/2021   AST 21 12/24/2021   ALKPHOS 91 12/24/2021   BILITOT <0.2 12/24/2021   Lab Results  Component Value Date   HGBA1C 6.1 (H) 12/24/2021   HGBA1C 6.2 07/09/2021   HGBA1C 6.1 05/07/2020   Lab Results  Component Value Date   INSULIN 10.8 12/24/2021   Lab Results  Component Value Date   TSH 2.06 07/09/2021   Lab Results  Component Value Date   CHOL 219 (H) 07/09/2021   HDL 51.00 07/09/2021   LDLCALC 141 (H) 07/09/2021   TRIG 138.0 07/09/2021   CHOLHDL 4 07/09/2021   Lab Results  Component Value Date   VD25OH 70.78 10/07/2021   VD25OH 117.36 (HH) 07/09/2021   Lab Results  Component Value Date   WBC 4.1 12/24/2021   HGB 12.0 12/24/2021   HCT 35.9 12/24/2021   MCV 82 12/24/2021   PLT 284 12/24/2021   Lab Results  Component Value Date   IRON 54 02/06/2022   TIBC 310.8 02/06/2022   FERRITIN 40.1 02/06/2022   Attestation Statements:   Reviewed by clinician on day of visit: allergies, medications, problem list, medical history, surgical history, family  history, social history, and previous encounter notes.  I, Elnora Morrison, RMA am acting as transcriptionist for Coralie Common, MD.  I have reviewed the above documentation for accuracy and completeness, and I agree with the above. - Coralie Common, MD

## 2022-07-14 ENCOUNTER — Ambulatory Visit (INDEPENDENT_AMBULATORY_CARE_PROVIDER_SITE_OTHER): Payer: Medicare Other | Admitting: Family Medicine

## 2022-07-14 ENCOUNTER — Encounter (INDEPENDENT_AMBULATORY_CARE_PROVIDER_SITE_OTHER): Payer: Self-pay | Admitting: Family Medicine

## 2022-07-14 VITALS — BP 111/69 | HR 66 | Temp 98.2°F | Ht 61.0 in | Wt 206.0 lb

## 2022-07-14 DIAGNOSIS — Z6839 Body mass index (BMI) 39.0-39.9, adult: Secondary | ICD-10-CM

## 2022-07-14 DIAGNOSIS — I10 Essential (primary) hypertension: Secondary | ICD-10-CM | POA: Diagnosis not present

## 2022-07-14 DIAGNOSIS — E669 Obesity, unspecified: Secondary | ICD-10-CM

## 2022-07-14 DIAGNOSIS — R7303 Prediabetes: Secondary | ICD-10-CM | POA: Diagnosis not present

## 2022-07-14 DIAGNOSIS — Z6841 Body Mass Index (BMI) 40.0 and over, adult: Secondary | ICD-10-CM | POA: Diagnosis not present

## 2022-07-14 NOTE — Progress Notes (Deleted)
Patient has beat herself up quite a bit the last two weeks because she hasn't been as compliant on plan as she would want to be.  She stopped doing some of the new habits she had previously implemented.  She realizes she didn't have readily available food for herself for when she doesn't want to cook.  She also had stopped weighing the protein because she thought she could eyeball portion.  Food is in the house for meal plan.  She may want to get up to the Cat 2 over the next few weeks.

## 2022-07-24 NOTE — Progress Notes (Signed)
Chief Complaint:   OBESITY Olivia Burns is here to discuss her progress with her obesity treatment plan along with follow-up of her obesity related diagnoses. Olivia Burns is on the Category 2 Plan and states she is following her eating plan approximately 50% of the time. Olivia Burns states she is walking the dog 40 minutes 7 times per week.  Today's visit was #: 10 Starting weight: 223 lbs Starting date: 12/24/2021 Today's weight: 206 lbs Today's date: 07/14/2022 Total lbs lost to date: 17 lbs Total lbs lost since last in-office visit: 1  Interim History: Olivia Burns has beat herself up quite a bit the last two weeks because she hasn't been as compliant on plan as she would want to be.  She stopped doing some of the new habits she had previously implemented.  She realizes she didn't have readily available food for herself for when she doesn't want to cook.  She also had stopped weighing the protein because she thought she could eyeball portion.  Food is in the house for meal plan.  She may want to get up to the Cat 2 over the next few weeks.  Subjective:   1. Essential hypertension Blood pressure well-controlled today, last appointment she had not taken her medications.  Denies chest pain, chest pressure and headache.  2. Prediabetes Not on any medications.  A1c at 6.1.  Labs consistent for last 2 years.  Assessment/Plan:   1. Essential hypertension Continue Diovan-HCT without changes in dose.  2. Prediabetes Will repeat labs at next appointment.  3. BMI 39.0-39.9,adult  4. Obesity with starting BMI of 42.1 Olivia Burns is currently in the action stage of change. As such, her goal is to continue with weight loss efforts. She has agreed to the Category 2 Plan.   Exercise goals: As is.  Behavioral modification strategies: increasing lean protein intake, increasing vegetables, meal planning and cooking strategies, keeping healthy foods in the home, and planning for success.  Olivia Burns has agreed to  follow-up with our clinic in 4 weeks. She was informed of the importance of frequent follow-up visits to maximize her success with intensive lifestyle modifications for her multiple health conditions.   Objective:   Blood pressure 111/69, pulse 66, temperature 98.2 F (36.8 C), height '5\' 1"'$  (1.549 m), weight 206 lb (93.4 kg), SpO2 98 %. Body mass index is 38.92 kg/m.  General: Cooperative, alert, well developed, in no acute distress. HEENT: Conjunctivae and lids unremarkable. Cardiovascular: Regular rhythm.  Lungs: Normal work of breathing. Neurologic: No focal deficits.   Lab Results  Component Value Date   CREATININE 0.87 12/24/2021   BUN 13 12/24/2021   NA 141 12/24/2021   K 3.9 12/24/2021   CL 99 12/24/2021   CO2 27 12/24/2021   Lab Results  Component Value Date   ALT 17 12/24/2021   AST 21 12/24/2021   ALKPHOS 91 12/24/2021   BILITOT <0.2 12/24/2021   Lab Results  Component Value Date   HGBA1C 6.1 (H) 12/24/2021   HGBA1C 6.2 07/09/2021   HGBA1C 6.1 05/07/2020   Lab Results  Component Value Date   INSULIN 10.8 12/24/2021   Lab Results  Component Value Date   TSH 2.06 07/09/2021   Lab Results  Component Value Date   CHOL 219 (H) 07/09/2021   HDL 51.00 07/09/2021   LDLCALC 141 (H) 07/09/2021   TRIG 138.0 07/09/2021   CHOLHDL 4 07/09/2021   Lab Results  Component Value Date   VD25OH 70.78 10/07/2021   VD25OH 117.36 (  HH) 07/09/2021   Lab Results  Component Value Date   WBC 4.1 12/24/2021   HGB 12.0 12/24/2021   HCT 35.9 12/24/2021   MCV 82 12/24/2021   PLT 284 12/24/2021   Lab Results  Component Value Date   IRON 54 02/06/2022   TIBC 310.8 02/06/2022   FERRITIN 40.1 02/06/2022   Attestation Statements:   Reviewed by clinician on day of visit: allergies, medications, problem list, medical history, surgical history, family history, social history, and previous encounter notes.  I, Elnora Morrison, RMA am acting as transcriptionist for Coralie Common, MD.  I have reviewed the above documentation for accuracy and completeness, and I agree with the above. - Coralie Common, MD

## 2022-08-04 DIAGNOSIS — Z1231 Encounter for screening mammogram for malignant neoplasm of breast: Secondary | ICD-10-CM | POA: Diagnosis not present

## 2022-08-04 LAB — HM MAMMOGRAPHY

## 2022-08-05 ENCOUNTER — Ambulatory Visit (INDEPENDENT_AMBULATORY_CARE_PROVIDER_SITE_OTHER): Payer: Medicare Other | Admitting: Family Medicine

## 2022-08-05 ENCOUNTER — Encounter (INDEPENDENT_AMBULATORY_CARE_PROVIDER_SITE_OTHER): Payer: Self-pay | Admitting: Family Medicine

## 2022-08-05 VITALS — BP 112/54 | HR 72 | Temp 98.1°F | Ht 61.0 in | Wt 203.0 lb

## 2022-08-05 DIAGNOSIS — I10 Essential (primary) hypertension: Secondary | ICD-10-CM | POA: Diagnosis not present

## 2022-08-05 DIAGNOSIS — R7303 Prediabetes: Secondary | ICD-10-CM | POA: Diagnosis not present

## 2022-08-05 DIAGNOSIS — Z6838 Body mass index (BMI) 38.0-38.9, adult: Secondary | ICD-10-CM | POA: Diagnosis not present

## 2022-08-05 DIAGNOSIS — E669 Obesity, unspecified: Secondary | ICD-10-CM

## 2022-08-05 NOTE — Progress Notes (Deleted)
Last few weeks patient had a week with really intense sugar cravings.  The more she ate the more she craved.  She then stopped and made homemade chili and fish and that is what she has concentrated on for the last week.  Doesn't think she is getting all the food in on plan.  She stuck with Cat 1 because she lost Cat 2.  Breakfast is on plan with 2 eggs, half of cheese with piece of toast.  Lunch is often sandwich with 4oz of [place], cheese, lettuce.  She also has an apple.  Supper has been bowl of chili or fish and vegetables.  No plans for the next week except for dog sitting on March 10-17.  Had a craving yesterday and instead of doing sugar wafers did sugar free wafers.

## 2022-08-13 NOTE — Progress Notes (Signed)
Chief Complaint:   OBESITY Olivia Burns is here to discuss her progress with her obesity treatment plan along with follow-up of her obesity related diagnoses. Olivia Burns is on the Category 2 Plan and states she is following her eating plan approximately 60% of the time. Olivia Burns states she is walking the dog 30 minutes 7 times per week.  Today's visit was #: 11 Starting weight: 223 lbs Starting date: 12/24/2021 Today's weight: 203 lbs Today's date: 08/05/2022 Total lbs lost to date: 20 lbs Total lbs lost since last in-office visit: 3 lbs  Interim History:  Last few weeks patient had a week with really intense sugar cravings.  The more she ate the more she craved.  She then stopped and made homemade chili and fish and that is what she has concentrated on for the last week.  Doesn't think she is getting all the food in on plan.  She stuck with Cat 1 because she lost Cat 2.  Breakfast is on plan with 2 eggs, half of cheese with piece of toast.  Lunch is often sandwich with 4oz of [place], cheese, lettuce.  She also has an apple.  Supper has been bowl of chili or fish and vegetables.  No plans for the next week except for dog sitting on March 10-17.  Had a craving yesterday and instead of doing sugar wafers did sugar free wafers.    Subjective:   1. Prediabetes Last A1c 6.1, insulin level 10.8.  Patient is positive for sugar cravings.   2. Essential hypertension Patient is taking Diovan/HCT.  Blood pressure is well controlled today.   Assessment/Plan:   1. Prediabetes Continue Category 2 meal plan, monitoring.  Also, has a 200 calorie snack allowance.   2. Essential hypertension Continue current medications.   3. BMI 38.0-38.9,adult  4. Obesity with starting BMI of 42.1 Olivia Burns is currently in the action stage of change. As such, her goal is to continue with weight loss efforts. She has agreed to the Category 2 Plan and keeping a food journal and adhering to recommended goals of 400-500  calories and 35+ protein at supper.   Exercise goals:  As is.   Behavioral modification strategies: increasing lean protein intake, meal planning and cooking strategies, keeping healthy foods in the home, and planning for success.  Olivia Burns has agreed to follow-up with our clinic in 4 weeks. She was informed of the importance of frequent follow-up visits to maximize her success with intensive lifestyle modifications for her multiple health conditions.   Objective:   Blood pressure (!) 112/54, pulse 72, temperature 98.1 F (36.7 C), height '5\' 1"'$  (1.549 m), weight 203 lb (92.1 kg), SpO2 98 %. Body mass index is 38.36 kg/m.  General: Cooperative, alert, well developed, in no acute distress. HEENT: Conjunctivae and lids unremarkable. Cardiovascular: Regular rhythm.  Lungs: Normal work of breathing. Neurologic: No focal deficits.   Lab Results  Component Value Date   CREATININE 0.87 12/24/2021   BUN 13 12/24/2021   NA 141 12/24/2021   K 3.9 12/24/2021   CL 99 12/24/2021   CO2 27 12/24/2021   Lab Results  Component Value Date   ALT 17 12/24/2021   AST 21 12/24/2021   ALKPHOS 91 12/24/2021   BILITOT <0.2 12/24/2021   Lab Results  Component Value Date   HGBA1C 6.1 (H) 12/24/2021   HGBA1C 6.2 07/09/2021   HGBA1C 6.1 05/07/2020   Lab Results  Component Value Date   INSULIN 10.8 12/24/2021   Lab  Results  Component Value Date   TSH 2.06 07/09/2021   Lab Results  Component Value Date   CHOL 219 (H) 07/09/2021   HDL 51.00 07/09/2021   LDLCALC 141 (H) 07/09/2021   TRIG 138.0 07/09/2021   CHOLHDL 4 07/09/2021   Lab Results  Component Value Date   VD25OH 70.78 10/07/2021   VD25OH 117.36 (HH) 07/09/2021   Lab Results  Component Value Date   WBC 4.1 12/24/2021   HGB 12.0 12/24/2021   HCT 35.9 12/24/2021   MCV 82 12/24/2021   PLT 284 12/24/2021   Lab Results  Component Value Date   IRON 54 02/06/2022   TIBC 310.8 02/06/2022   FERRITIN 40.1 02/06/2022    Attestation Statements:   Reviewed by clinician on day of visit: allergies, medications, problem list, medical history, surgical history, family history, social history, and previous encounter notes.  I, Davy Pique, RMA, am acting as transcriptionist for Coralie Common, MD. I have reviewed the above documentation for accuracy and completeness, and I agree with the above. - Coralie Common, MD

## 2022-08-14 ENCOUNTER — Other Ambulatory Visit: Payer: Self-pay | Admitting: Family Medicine

## 2022-08-15 ENCOUNTER — Encounter: Payer: Self-pay | Admitting: Family Medicine

## 2022-09-08 ENCOUNTER — Encounter (INDEPENDENT_AMBULATORY_CARE_PROVIDER_SITE_OTHER): Payer: Self-pay | Admitting: Family Medicine

## 2022-09-08 ENCOUNTER — Ambulatory Visit (INDEPENDENT_AMBULATORY_CARE_PROVIDER_SITE_OTHER): Payer: Medicare Other | Admitting: Family Medicine

## 2022-09-08 VITALS — BP 137/68 | HR 77 | Temp 97.9°F | Ht 61.0 in | Wt 202.0 lb

## 2022-09-08 DIAGNOSIS — I10 Essential (primary) hypertension: Secondary | ICD-10-CM

## 2022-09-08 DIAGNOSIS — Z6838 Body mass index (BMI) 38.0-38.9, adult: Secondary | ICD-10-CM

## 2022-09-08 DIAGNOSIS — F3289 Other specified depressive episodes: Secondary | ICD-10-CM

## 2022-09-08 DIAGNOSIS — E669 Obesity, unspecified: Secondary | ICD-10-CM | POA: Diagnosis not present

## 2022-09-08 MED ORDER — BUPROPION HCL ER (SR) 150 MG PO TB12: 150.0000 mg | ORAL_TABLET | Freq: Every day | ORAL | 0 refills | Status: DC

## 2022-09-08 NOTE — Progress Notes (Unsigned)
Chief Complaint:   OBESITY Olivia Burns is here to discuss her progress with her obesity treatment plan along with follow-up of her obesity related diagnoses. Olivia Burns is on the Category 2 Plan and keeping a food journal and adhering to recommended goals of 400-500 calories and 35+ protein and states she is following her eating plan approximately 70% of the time. Olivia Burns states she is doing regular walking.  Today's visit was #: 12 Starting weight: 223 LBS Starting date: 12/24/2021 Today's weight: 202 LBS Today's date: 09/08/2022 Total lbs lost to date: 21 LBS Total lbs lost since last in-office visit: 1 LB  Interim History: Since last appointment she has been not up to much.  She got the shingles shot 4 days ago and felt ill from that. Foodwise she has been craving sugar more than she did previously.  She will eat the food on the plan and then desire something sweet at the end of a meal.  She this part of this is due to not getting enough sleep.  Has an upcoming [employer] gathering but that is up coming up.    Subjective:   1. Essential hypertension Patient blood pressure well-controlled today.  Patient denies chest pain, chest pressure, headache.  2. Other depression, with emotional eating Patient is taking Wellbutrin with good cravings control.  Patient denies suicidal or homicidal ideations.  Assessment/Plan:   1. Essential hypertension Continue Diovan/HCT.  2. Other depression, with emotional eating Refill- buPROPion (WELLBUTRIN SR) 150 MG 12 hr tablet; Take 1 tablet (150 mg total) by mouth daily.  Dispense: 90 tablet; Refill: 0  3. BMI 38.0-38.9,adult  4. Obesity with starting BMI of 42.1 Olivia Burns is currently in the action stage of change. As such, her goal is to continue with weight loss efforts. She has agreed to the Category 2 Plan and keeping a food journal and adhering to recommended goals of 400-500 calories and 35+ protein daily.    Exercise goals: All adults should  avoid inactivity. Some physical activity is better than none, and adults who participate in any amount of physical activity gain some health benefits.  Behavioral modification strategies: increasing lean protein intake, meal planning and cooking strategies, keeping healthy foods in the home, and planning for success.  Rise has agreed to follow-up with our clinic in 4 weeks. She was informed of the importance of frequent follow-up visits to maximize her success with intensive lifestyle modifications for her multiple health conditions.   Objective:   Blood pressure 137/68, pulse 77, temperature 97.9 F (36.6 C), height 5\' 1"  (1.549 m), weight 202 lb (91.6 kg), SpO2 98 %. Body mass index is 38.17 kg/m.  General: Cooperative, alert, well developed, in no acute distress. HEENT: Conjunctivae and lids unremarkable. Cardiovascular: Regular rhythm.  Lungs: Normal work of breathing. Neurologic: No focal deficits.   Lab Results  Component Value Date   CREATININE 0.87 12/24/2021   BUN 13 12/24/2021   NA 141 12/24/2021   K 3.9 12/24/2021   CL 99 12/24/2021   CO2 27 12/24/2021   Lab Results  Component Value Date   ALT 17 12/24/2021   AST 21 12/24/2021   ALKPHOS 91 12/24/2021   BILITOT <0.2 12/24/2021   Lab Results  Component Value Date   HGBA1C 6.1 (H) 12/24/2021   HGBA1C 6.2 07/09/2021   HGBA1C 6.1 05/07/2020   Lab Results  Component Value Date   INSULIN 10.8 12/24/2021   Lab Results  Component Value Date   TSH 2.06  07/09/2021   Lab Results  Component Value Date   CHOL 219 (H) 07/09/2021   HDL 51.00 07/09/2021   LDLCALC 141 (H) 07/09/2021   TRIG 138.0 07/09/2021   CHOLHDL 4 07/09/2021   Lab Results  Component Value Date   VD25OH 70.78 10/07/2021   VD25OH 117.36 (HH) 07/09/2021   Lab Results  Component Value Date   WBC 4.1 12/24/2021   HGB 12.0 12/24/2021   HCT 35.9 12/24/2021   MCV 82 12/24/2021   PLT 284 12/24/2021   Lab Results  Component Value Date    IRON 54 02/06/2022   TIBC 310.8 02/06/2022   FERRITIN 40.1 02/06/2022   Attestation Statements:   Reviewed by clinician on day of visit: allergies, medications, problem list, medical history, surgical history, family history, social history, and previous encounter notes.  I, Malcolm Metro, RMA, am acting as transcriptionist for Reuben Likes, MD.  I have reviewed the above documentation for accuracy and completeness, and I agree with the above. - Reuben Likes, MD

## 2022-10-06 ENCOUNTER — Ambulatory Visit (INDEPENDENT_AMBULATORY_CARE_PROVIDER_SITE_OTHER): Payer: Medicare Other | Admitting: Physician Assistant

## 2022-10-06 ENCOUNTER — Encounter (INDEPENDENT_AMBULATORY_CARE_PROVIDER_SITE_OTHER): Payer: Self-pay | Admitting: Physician Assistant

## 2022-10-06 VITALS — BP 122/72 | HR 65 | Temp 98.1°F | Ht 61.0 in | Wt 200.0 lb

## 2022-10-06 DIAGNOSIS — R7303 Prediabetes: Secondary | ICD-10-CM | POA: Diagnosis not present

## 2022-10-06 DIAGNOSIS — E669 Obesity, unspecified: Secondary | ICD-10-CM

## 2022-10-06 DIAGNOSIS — F32A Depression, unspecified: Secondary | ICD-10-CM | POA: Insufficient documentation

## 2022-10-06 DIAGNOSIS — I10 Essential (primary) hypertension: Secondary | ICD-10-CM

## 2022-10-06 DIAGNOSIS — Z6837 Body mass index (BMI) 37.0-37.9, adult: Secondary | ICD-10-CM | POA: Diagnosis not present

## 2022-10-06 DIAGNOSIS — F3289 Other specified depressive episodes: Secondary | ICD-10-CM

## 2022-10-06 DIAGNOSIS — Z6838 Body mass index (BMI) 38.0-38.9, adult: Secondary | ICD-10-CM | POA: Insufficient documentation

## 2022-10-06 NOTE — Progress Notes (Signed)
Office: [phone]  /  Fax: [phone]  WEIGHT SUMMARY AND BIOMETRICS  Vitals Temp: 98.1 F (36.7 C) BP: 122/72 Pulse Rate: 65 SpO2: 99 %   Anthropometric Measurements Height: 5\' 1"  (1.549 m) Weight: 200 lb (90.7 kg) BMI (Calculated): 37.81 Weight at Last Visit: 202 lb Weight Lost Since Last Visit: 2 lb Weight Gained Since Last Visit: 0 Starting Weight: 223 lb   Body Composition  Body Fat %: 46.8 % Fat Mass (lbs): 93.8 lbs Muscle Mass (lbs): 101.2 lbs Total Body Water (lbs): 69.8 lbs Visceral Fat Rating : 16   Other Clinical Data Fasting: no Labs: no Today's Visit #: 13 Starting Date: 12/24/21     HPI  Chief Complaint: OBESITY  Olivia Burns is here to discuss her progress with her obesity treatment plan. She is on the the Category 2 Plan and states she is following her eating plan approximately 85 % of the time. She states she is exercising 20-30 minutes 2 times per week.   Interval History:  Since last office visit she is down 2 pounds Hunger/appetite-moderate controlled Cravings-reports craving for sugar particularly in the evenings Sleep-reports waking up 2-3 times nightly to urinate and averaging only about 5 to 6 hours nightly Exercise-she is riding her recumbent bike 20 minutes every other day or walking and we talked about gradually increasing her recumbent bike riding Hydration-she has been working on increasing her hydration and drinks with her morning medications, and then tries to get in 220 ounce Crystal lights during the day, and then typically drinks again with her bedtime medications.  We discussed trying to increase the Crystal light during the day prior to dinner with at least another 20 ounces before dinnertime in order to hopefully reduce the nighttime waking for urination and she will see how she does with this.   Pharmacotherapy: Bupropion for cravings/emotional eating.  The patient does report some mild tremoring of hands when she goes to  pick things up and wonders if this is related to the bupropion but otherwise no side effects.  PHYSICAL EXAM:  Blood pressure 122/72, pulse 65, temperature 98.1 F (36.7 C), height 5\' 1"  (1.549 m), weight 200 lb (90.7 kg), SpO2 99 %. Body mass index is 37.79 kg/m.  General: She is overweight, cooperative, alert, well developed, and in no acute distress. PSYCH: Has normal mood, affect and thought process.   Cardiovascular: HR 60's, regular Lungs: Normal breathing effort, no conversational dyspnea. Neuro: Very slight tremor of hand with going to pick something up, otherwise no focal deficit.  DIAGNOSTIC DATA REVIEWED:  BMET    Component Value Date/Time   NA 141 12/24/2021 1412   K 3.9 12/24/2021 1412   CL 99 12/24/2021 1412   CO2 27 12/24/2021 1412   GLUCOSE 95 12/24/2021 1412   GLUCOSE 107 (H) 07/09/2021 0859   BUN 13 12/24/2021 1412   CREATININE 0.87 12/24/2021 1412   CALCIUM 9.3 12/24/2021 1412   Lab Results  Component Value Date   HGBA1C 6.1 (H) 12/24/2021   HGBA1C 6.1 05/07/2020   Lab Results  Component Value Date   INSULIN 10.8 12/24/2021   Lab Results  Component Value Date   TSH 2.06 07/09/2021   CBC    Component Value Date/Time   WBC 4.1 12/24/2021 1412   RBC 4.39 12/24/2021 1412   HGB 12.0 12/24/2021 1412   HCT 35.9 12/24/2021 1412   PLT 284 12/24/2021 1412   MCV 82 12/24/2021 1412   MCH 27.3 12/24/2021 1412  MCHC 33.4 12/24/2021 1412   RDW 13.3 12/24/2021 1412   Iron Studies    Component Value Date/Time   IRON 54 02/06/2022 1015   TIBC 310.8 02/06/2022 1015   FERRITIN 40.1 02/06/2022 1015   IRONPCTSAT 17.4 (L) 02/06/2022 1015   Lipid Panel     Component Value Date/Time   CHOL 219 (H) 07/09/2021 0859   TRIG 138.0 07/09/2021 0859   HDL 51.00 07/09/2021 0859   CHOLHDL 4 07/09/2021 0859   VLDL 27.6 07/09/2021 0859   LDLCALC 141 (H) 07/09/2021 0859   Hepatic Function Panel     Component Value Date/Time   PROT 7.0 12/24/2021 1412    ALBUMIN 4.3 12/24/2021 1412   AST 21 12/24/2021 1412   ALT 17 12/24/2021 1412   ALKPHOS 91 12/24/2021 1412   BILITOT <0.2 12/24/2021 1412      Component Value Date/Time   TSH 2.06 07/09/2021 0859   Nutritional Lab Results  Component Value Date   VD25OH 70.78 10/07/2021   VD25OH 117.36 (HH) 07/09/2021    ASSOCIATED CONDITIONS ADDRESSED TODAY  ASSESSMENT AND PLAN Hypertension Hypertension well controlled, improved, asymptomatic, no significant medication side effects noted, and needs further observation.  Medication(s): Valsartan-hydrochlorothiazide 80/12.5 mg 1 daily Renal function stable. No side effects with medication.   BP Readings from Last 3 Encounters:  10/06/22 122/72  09/08/22 137/68  08/05/22 (!) 112/54   Lab Results  Component Value Date   CREATININE 0.87 12/24/2021   CREATININE 0.88 07/09/2021   CREATININE 0.88 05/07/2020   Lab Results  Component Value Date   GFR 66.21 07/09/2021   GFR 66.75 05/07/2020     Prediabetes Last A1c was 6.1/ Insulin 10.8- Not at goals.  Medication(s): None She is working on nutrition plan to decrease simple carbohydrates, increase lean proteins and exercise to promote weight loss, improve glycemic control and prevent progression to Type 2 diabetes.   Lab Results  Component Value Date   HGBA1C 6.1 (H) 12/24/2021   HGBA1C 6.2 07/09/2021   HGBA1C 6.1 05/07/2020   Lab Results  Component Value Date   INSULIN 10.8 12/24/2021    Plan:  Continue working on nutrition plan to decrease simple carbohydrates, increase lean proteins and exercise to promote weight loss, improve glycemic control and prevent progression to Type 2 diabetes.  Plan to recheck labs over next 1- 2 months.   Other depression/emotional eating Olivia Burns has had issues with stress/emotional eating. Currently this is moderately controlled. Overall mood is stable. Medication(s): Bupropion SR 150 mg daily in am  Plan: Continue Bupropion SR 150 mg daily in am  Patient reports no refills needed today. She does feel bupropion is helpful to control cravings, but some concerns with slight tremor noted.  Will monitor. May need to taper/stop bupropion and monitor off medications.  Discussed strategies for cravings, emotional eating and advised to make sure she is getting higher protein snack in evenings following dinner to avoid hunger/ emotional eating triggers. Provided 100 calorie protein snack handout .    Problem List Items Addressed This Visit     Essential hypertension - Primary   Prediabetes   Depression   Obesity with starting BMI of 42.1   BMI 37.0-37.9, adult Current BMI 37.9   Current BMI 37.9 Down 23 lbs overall TBW loss 10.31%  TREATMENT PLAN FOR OBESITY:  Recommended Dietary Goals  Neketa is currently in the action stage of change. As such, her goal is to continue weight management plan. She has agreed to the Category 2  Plan and keeping a food journal and adhering to recommended goals of 400-500 calories and 35+ grams of protein.  Behavioral Intervention    Behavioral modification strategies: increasing lean protein intake, meal planning and cooking strategies, keeping healthy foods in the home, increasing water intake prior to evening meals and planning for success.   Additional resources provided today: 100 calorie protein snacks.   Recommended Physical Activity Goals  Novi has been advised to work up to 150 minutes of moderate intensity aerobic activity a week and strengthening exercises 2-3 times per week for cardiovascular health, weight loss maintenance and preservation of muscle mass.   She has agreed to Continue current level of physical activity  and Increase the intensity, frequency or duration of aerobic exercises     Pharmacotherapy We discussed various medication options to help [facility] with her weight loss efforts and we both agreed to continue to work on nutritional and behavioral strategies to promote weight  loss.      Return in about 3 weeks (around 10/27/2022).Marland Kitchen She was informed of the importance of frequent follow up visits to maximize her success with intensive lifestyle modifications for her multiple health conditions.   ATTESTASTION STATEMENTS:  Reviewed by clinician on day of visit: allergies, medications, problem list, medical history, surgical history, family history, social history, and previous encounter notes.   I have personally spent 40 minutes total time today in preparation, patient care, nutritional counseling and documentation for this visit, including the following: review of clinical lab tests; review of medical tests/procedures/services.      Olivia Hutcherson, PA-C

## 2022-10-10 ENCOUNTER — Ambulatory Visit (INDEPENDENT_AMBULATORY_CARE_PROVIDER_SITE_OTHER): Payer: Medicare Other | Admitting: Family Medicine

## 2022-10-10 ENCOUNTER — Encounter: Payer: Self-pay | Admitting: Family Medicine

## 2022-10-10 VITALS — BP 114/68 | HR 77 | Temp 98.6°F | Ht 61.0 in | Wt 206.0 lb

## 2022-10-10 DIAGNOSIS — E785 Hyperlipidemia, unspecified: Secondary | ICD-10-CM

## 2022-10-10 DIAGNOSIS — R0789 Other chest pain: Secondary | ICD-10-CM

## 2022-10-10 DIAGNOSIS — I10 Essential (primary) hypertension: Secondary | ICD-10-CM | POA: Diagnosis not present

## 2022-10-10 DIAGNOSIS — R7303 Prediabetes: Secondary | ICD-10-CM

## 2022-10-10 DIAGNOSIS — M722 Plantar fascial fibromatosis: Secondary | ICD-10-CM | POA: Diagnosis not present

## 2022-10-10 LAB — COMPREHENSIVE METABOLIC PANEL
ALT: 19 U/L (ref 0–35)
AST: 17 U/L (ref 0–37)
Albumin: 4 g/dL (ref 3.5–5.2)
Alkaline Phosphatase: 95 U/L (ref 39–117)
BUN: 13 mg/dL (ref 6–23)
CO2: 31 mEq/L (ref 19–32)
Calcium: 9.4 mg/dL (ref 8.4–10.5)
Chloride: 100 mEq/L (ref 96–112)
Creatinine, Ser: 0.91 mg/dL (ref 0.40–1.20)
GFR: 63.04 mL/min (ref >60.00)
Glucose, Bld: 102 mg/dL — ABNORMAL HIGH (ref 70–99)
Potassium: 4.4 mEq/L (ref 3.5–5.1)
Sodium: 138 mEq/L (ref 135–145)
Total Bilirubin: 0.3 mg/dL (ref 0.2–1.2)
Total Protein: 6.9 g/dL (ref 6.0–8.3)

## 2022-10-10 LAB — LIPID PANEL
Cholesterol: 234 mg/dL — ABNORMAL HIGH (ref 0–200)
HDL: 52.7 mg/dL (ref >39.00)
LDL Cholesterol: 164 mg/dL — ABNORMAL HIGH (ref 0–99)
NonHDL: 181.08
Total CHOL/HDL Ratio: 4
Triglycerides: 85 mg/dL (ref 0.0–149.0)
VLDL: 17 mg/dL (ref 0.0–40.0)

## 2022-10-10 LAB — HEMOGLOBIN A1C: Hgb A1c MFr Bld: 6.3 % (ref 4.6–6.5)

## 2022-10-10 MED ORDER — ROSUVASTATIN CALCIUM 5 MG PO TABS
5.0000 mg | ORAL_TABLET | Freq: Every day | ORAL | 3 refills | Status: DC
Start: 2022-10-10 — End: 2023-09-28

## 2022-10-10 NOTE — Patient Instructions (Addendum)
Give [place] 2-3 business days to get the results of your labs back.   Keep the diet clean and stay active.  If the chest pain does not improve in 3-4 weeks, send me a message and let us know.  Consider Powerstep insoles. There are very quality over the counter inserts. Shop around online and in stores. Dr. Margart Sickles is a cheaper alternative, though is not as high of quality.   Consider a Strassburg sock.   Let us know if you need anything.  Plantar Fasciitis Stretches/exercises Do exercises exactly as told by your health care provider and adjust them as directed. It is normal to feel mild stretching, pulling, tightness, or discomfort as you do these exercises, but you should stop right away if you feel sudden pain or your pain gets worse.   Stretching and range of motion exercises These exercises warm up your muscles and joints and improve the movement and flexibility of your foot. These exercises also help to relieve pain.  Exercise A: Plantar fascia stretch Sit with your left / right leg crossed over your opposite knee. Hold your heel with one hand with that thumb near your arch. With your other hand, hold your toes and gently pull them back toward the top of your foot. You should feel a stretch on the bottom of your toes or your foot or both. Hold this stretch for 30 seconds. Slowly release your toes and return to the starting position. Repeat 2 times. Complete this exercise 3 times per week.  Exercise B: Gastroc, standing Stand with your hands against a wall. Extend your left / right leg behind you, and bend your front knee slightly. Keeping your heels on the floor and keeping your back knee straight, shift your weight toward the wall without arching your back. You should feel a gentle stretch in your left / right calf. Hold this position for 30 seconds. Repeat 2 times. Complete this exercise 3 times a week. Exercise C: Soleus, standing Stand with your hands against a wall. Extend  your left / right leg behind you, and bend your front knee slightly. Keeping your heels on the floor, bend your back knee and slightly shift your weight over the back leg. You should feel a gentle stretch deep in your calf. Hold this position for 30 seconds. Repeat 2 times. Complete this exercise 3 times per week. Exercise D: Gastrocsoleus, standing Stand with the ball of your left / right foot on a step. The ball of your foot is on the walking surface, right under your toes. Keep your other foot firmly on the same step. Hold onto the wall or a railing for balance. Slowly lift your other foot, allowing your body weight to press your heel down over the edge of the step. You should feel a stretch in your left / right calf. Hold this position for 30 seconds. Return both feet to the step. Repeat this exercise with a slight bend in your left / right knee. Repeat 2 times with your left / right knee straight and 2times with your left / right knee bent. Complete this exercise 3 times a week.  Balance exercise This exercise builds your balance and strength control of your arch to help take pressure off your plantar fascia. Exercise E: Single leg stand Without shoes, stand near a railing or in a doorway. You may hold onto the railing or door frame as needed. Stand on your left / right foot. Keep your big toe down on the  floor and try to keep your arch lifted. Do not let your foot roll inward. Hold this position for 30 seconds. If this exercise is too easy, you can try it with your eyes closed or while standing on a pillow. Repeat 2 times. Complete this exercise 3 times per week. This information is not intended to replace advice given to you by your health care provider. Make sure you discuss any questions you have with your health care provider. Document Released: 05/19/2005 Document Revised: 01/22/2016 Document Reviewed: 04/02/2015 Elsevier Interactive Patient Education  2017 [employer].

## 2022-10-10 NOTE — Progress Notes (Signed)
Chief Complaint  Patient presents with   Follow-up    Subjective Olivia Burns is a 72 y.o. female who presents for hypertension follow up. She does monitor home blood pressures. Blood pressures ranging from 120's/80's on average. She is compliant with medications- Diovan-HCT 80-12.5 mg/d. Patient has these side effects of medication: none She is sometimes adhering to a healthy diet overall. Current exercise: walking/cycling No current CP or SOB.   Hyperlipidemia Patient presents for hyperlipidemia follow up. She has a history of an elevated LDL. She is not on a statin. She recently had a mammogram with associated calcium screening that showed calcification in her breast arteries. Diet/exercise as above. The patient is not known to have coexisting coronary artery disease.  She has a history of prediabetes not on any medication.  Yesterday, she had a sharp chest pain over her lateral chest wall.  She was laying down when it happened.  When she exercises she does not get this pain.  No associated shortness of breath, arm pain or jaw pain.  She does not currently have any pain.  Over the past few months, the patient has had pain over her left heel.  She does have a history of plantar fasciitis.  She has not tried much for it at this time.  No injury or obvious trigger, or change in activity.   Past Medical History:  Diagnosis Date   Allergy    Anemia    Anxiety    Arthritis    Asthma    Constipation    Depression    GERD (gastroesophageal reflux disease)    Hyperlipidemia    Hypertension    Osteoarthritis    Pre-diabetes    Sleep apnea     Exam BP 114/68 (BP Location: Left Arm, Patient Position: Sitting, Cuff Size: Large)   Pulse 77   Temp 98.6 F (37 C) (Oral)   Ht 5\' 1"  (1.549 m)   Wt 206 lb (93.4 kg)   SpO2 95%   BMI 38.92 kg/m  General:  well developed, well nourished, in no apparent distress Heart: RRR, no bruits, no LE edema Lungs: clear to  auscultation, no accessory muscle use MSK: Chest pain is reproducible with palpation over the lateral chest wall between the anterior and posterior axillary line.  There is no crepitus, deformity, or excessive warmth. Over her left heel, there is tenderness to palpation of the proximal insertion of the plantar fascia medially. Psych: well oriented with normal range of affect and appropriate judgment/insight  Essential hypertension - Plan: Comprehensive metabolic panel, Lipid panel  Hyperlipidemia, unspecified hyperlipidemia type - Plan: Lipid panel, Hepatic function panel  Prediabetes - Plan: Hemoglobin A1c  Plantar fasciitis  Chest wall pain  Chronic, stable.  Continue Diovan HCTZ 80-12.5 mg daily.  Counseled on diet and exercise. Chronic, unstable.  Given her history of elevated LDL and recent calcification, agreed to start Crestor 5 mg daily.  Recheck labs in 6 weeks. Check A1c. Strassburg sock, arch support, ice, Tylenol, stretches and exercises.  Physical therapy if no improvement. Atypical in nature.  Reassurance for this.  Physical therapy if no better in the next 3 to 4 weeks. F/u in 6 mo; 6 weeks for labs. The patient voiced understanding and agreement to the plan.  Jilda Roche [place], DO 10/10/22  11:20 AM

## 2022-11-03 ENCOUNTER — Encounter (INDEPENDENT_AMBULATORY_CARE_PROVIDER_SITE_OTHER): Payer: Self-pay | Admitting: Physician Assistant

## 2022-11-03 ENCOUNTER — Ambulatory Visit (INDEPENDENT_AMBULATORY_CARE_PROVIDER_SITE_OTHER): Payer: Medicare Other | Admitting: Physician Assistant

## 2022-11-03 VITALS — BP 102/64 | HR 69 | Temp 98.0°F | Ht 61.0 in | Wt 200.0 lb

## 2022-11-03 DIAGNOSIS — F3289 Other specified depressive episodes: Secondary | ICD-10-CM | POA: Diagnosis not present

## 2022-11-03 DIAGNOSIS — E669 Obesity, unspecified: Secondary | ICD-10-CM | POA: Diagnosis not present

## 2022-11-03 DIAGNOSIS — I10 Essential (primary) hypertension: Secondary | ICD-10-CM | POA: Diagnosis not present

## 2022-11-03 DIAGNOSIS — Z6837 Body mass index (BMI) 37.0-37.9, adult: Secondary | ICD-10-CM | POA: Diagnosis not present

## 2022-11-03 DIAGNOSIS — R7303 Prediabetes: Secondary | ICD-10-CM

## 2022-11-03 NOTE — Progress Notes (Signed)
.smr  Office: [phone]  /  Fax: [phone]  WEIGHT SUMMARY AND BIOMETRICS  Vitals Temp: 98 F (36.7 C) BP: 102/64 Pulse Rate: 69 SpO2: 99 %   Anthropometric Measurements Height: 5\' 1"  (1.549 m) Weight: 200 lb (90.7 kg) BMI (Calculated): 37.81 Weight at Last Visit: 206 lb Weight Lost Since Last Visit: 0 lb Weight Gained Since Last Visit: 0 lb Starting Weight: 223 lb Total Weight Loss (lbs): 23 lb (10.4 kg)   Body Composition  Body Fat %: 46.7 % Fat Mass (lbs): 93.4 lbs Muscle Mass (lbs): 101.2 lbs Total Body Water (lbs): 70 lbs Visceral Fat Rating : 16   Other Clinical Data Fasting: Yes Labs: No Today's Visit #: 14 Starting Date: 12/24/21     HPI  Chief Complaint: OBESITY  Olivia Burns is here to discuss her progress with her obesity treatment plan. She is on the the Category 2 Plan and states she is following her eating plan approximately 75 % of the time. She states she is exercising 20-30 minutes 7 times per week. Discussed the use of AI scribe software for clinical note transcription with the patient, who gave verbal consent to proceed.  History of Present Illness   The patient, under management for obesity, prediabetes, hypertension, and emotional eating behavior, has lost 23 pounds since starting the program last July. However, she reports recent struggles with emotional eating, particularly during periods of stress and sleep disturbances. The patient denies any specific stressors but attributes the emotional eating to poor sleep quality. She reports hearing noises outside her house, which disrupts her sleep and leaves her feeling unrelaxed.  To aid sleep, the patient has started consuming cherries or tart cherry juice, which she reports helps her sleep. She also uses a ceiling fan for white noise, but it does not seem to be sufficient.  The patient's emotional eating is characterized by cravings for sweet or salty foods, with a preference for sweet snacks.  She reports that she is lactose intolerant, which limits her options for sweet snacks. She has tried [place] yogurt bars and finds them acceptable sometimes.  The patient also reports that she often waits too long to eat in the morning, usually not eating until around noon. She has tried protein shakes in the past, but lactose content has been an issue.  The patient is currently on Wellbutrin, which she reports helps with cravings on some days but not others. She also reports a side effect of shakiness in her left hand since starting the medication.       Interval History:  Since last office visit she maintained her weight loss.  Hunger/appetite-well controlled, but not always meeting protein needs. Discussed protein snacks and supplementing with protein shakes as frequently does not eat breakfast until noon. Also provided options for breakfast. Discussed trying [employer] milk as is lactose free and she is lactose intolerant.  Cravings- increased lately. Not sleeping well as hearing things in the home and waking up a lot. Does feel safe in home. Discussed using a white noise machine to help with sleep.  Sleep- Not sleeping well lately. Tried some tart cherry juice ~ 4 oz at bedtime and has more restful sleep when drinking this.  Exercise-walking dogs 20-30 minutes 7 days weekly.   The patient's emotional eating is characterized by cravings for sweet or salty foods, with a preference for sweet snacks. She reports that she is lactose intolerant, which limits her options for sweet snacks. She has tried [place]  and finds them acceptable sometimes.  She saw Dr. Carmelia Roller for AWV 10/10/22 and follow up labs were reviewed today with the patient's appropriate diagnosis.   Pharmacotherapy: bupropion for cravings/emotional eating. Attributes the emotional eating to poor sleep quality. She reports hearing noises outside her house, which disrupts her sleep and leaves her feeling unrelaxed.  She is having some mild tremor in left hand which she feels started after starting bupropion, so may need to discontinue. She does feel it helps somewhat with cravings, however, so will continue to monitor closely.   TREATMENT PLAN FOR OBESITY:  Recommended Dietary Goals  [name] is currently in the action stage of change. As such, her goal is to continue weight management plan. She has agreed to the Category 2 Plan.  Behavioral Intervention  We discussed the following Behavioral Modification Strategies today: increasing lean protein intake, decreasing simple carbohydrates , increasing vegetables, increasing lower glycemic fruits, avoiding skipping meals, increasing water intake, emotional eating strategies and understanding the difference between hunger signals and cravings, continue to practice mindfulness when eating, planning for success, and better snacking choices.  Additional resources provided today: 100 calorie protein snacks      Protein shake   Recommended Physical Activity Goals  [name] has been advised to work up to 150 minutes of moderate intensity aerobic activity a week and strengthening exercises 2-3 times per week for cardiovascular health, weight loss maintenance and preservation of muscle mass.   She has agreed to Continue current level of physical activity    Pharmacotherapy We discussed various medication options to help [facility] with her weight loss efforts and we both agreed to continue bupropion for cravings/emotional eating behavior.    Return in about 4 weeks (around 12/01/2022).Marland Kitchen She was informed of the importance of frequent follow up visits to maximize her success with intensive lifestyle modifications for her multiple health conditions.  PHYSICAL EXAM:  Blood pressure 102/64, pulse 69, temperature 98 F (36.7 C), height 5\' 1"  (1.549 m), weight 200 lb (90.7 kg), SpO2 99 %. Body mass index is 37.79 kg/m.  General: She is overweight, cooperative, alert, well  developed, and in no acute distress. PSYCH: Has normal mood, affect and thought process.   Cardiovascular: HR 60's regular.  Lungs: Normal breathing effort, no conversational dyspnea. Neuro: no focal deficit.   DIAGNOSTIC DATA REVIEWED:  BMET    Component Value Date/Time   NA 138 10/10/2022 0956   NA 141 12/24/2021 1412   K 4.4 10/10/2022 0956   CL 100 10/10/2022 0956   CO2 31 10/10/2022 0956   GLUCOSE 102 (H) 10/10/2022 0956   BUN 13 10/10/2022 0956   BUN 13 12/24/2021 1412   CREATININE 0.91 10/10/2022 0956   CALCIUM 9.4 10/10/2022 0956   Lab Results  Component Value Date   HGBA1C 6.3 10/10/2022   HGBA1C 6.1 05/07/2020   Lab Results  Component Value Date   INSULIN 10.8 12/24/2021   Lab Results  Component Value Date   TSH 2.06 07/09/2021   CBC    Component Value Date/Time   WBC 4.1 12/24/2021 1412   RBC 4.39 12/24/2021 1412   HGB 12.0 12/24/2021 1412   HCT 35.9 12/24/2021 1412   PLT 284 12/24/2021 1412   MCV 82 12/24/2021 1412   MCH 27.3 12/24/2021 1412   MCHC 33.4 12/24/2021 1412   RDW 13.3 12/24/2021 1412   Iron Studies    Component Value Date/Time   IRON 54 02/06/2022 1015   TIBC 310.8 02/06/2022 1015   FERRITIN  40.1 02/06/2022 1015   IRONPCTSAT 17.4 (L) 02/06/2022 1015   Lipid Panel     Component Value Date/Time   CHOL 234 (H) 10/10/2022 0956   TRIG 85.0 10/10/2022 0956   HDL 52.70 10/10/2022 0956   CHOLHDL 4 10/10/2022 0956   VLDL 17.0 10/10/2022 0956   LDLCALC 164 (H) 10/10/2022 0956   Hepatic Function Panel     Component Value Date/Time   PROT 6.9 10/10/2022 0956   PROT 7.0 12/24/2021 1412   ALBUMIN 4.0 10/10/2022 0956   ALBUMIN 4.3 12/24/2021 1412   AST 17 10/10/2022 0956   ALT 19 10/10/2022 0956   ALKPHOS 95 10/10/2022 0956   BILITOT 0.3 10/10/2022 0956   BILITOT <0.2 12/24/2021 1412      Component Value Date/Time   TSH 2.06 07/09/2021 0859   Nutritional Lab Results  Component Value Date   VD25OH 70.78 10/07/2021   VD25OH  117.36 (HH) 07/09/2021    ASSOCIATED CONDITIONS ADDRESSED TODAY  ASSESSMENT AND PLAN  Problem List Items Addressed This Visit     Essential hypertension   Prediabetes - Primary   Depression   Obesity with starting BMI of 42.1   BMI 37.0-37.9, adult Current BMI 37.9   Hypertension Labs were reviewed today and discussed with the patient.  Hypertension well controlled, asymptomatic, and no significant medication side effects noted.  Medication(s): valsartan-HCTZ 80/12.5 mg daily Renal function is stable and in normal range.  BP Readings from Last 3 Encounters:  11/03/22 102/64  10/10/22 114/68  10/06/22 122/72   Lab Results  Component Value Date   CREATININE 0.91 10/10/2022   CREATININE 0.87 12/24/2021   CREATININE 0.88 07/09/2021   Lab Results  Component Value Date   GFR 63.04 10/10/2022   GFR 66.21 07/09/2021   GFR 66.75 05/07/2020    Plan: Continue all antihypertensives at current dosages. Continue to work on nutrition plan to promote weight loss and improve BP control.   Prediabetes Labs were reviewed today and discussed with the patient.  Last A1c was 6.3 / Insulin 10.8- not at goals .  Medication(s): None Polyphagia:No She is working on nutrition plan to decrease simple carbohydrates, increase lean proteins and exercise to promote weight loss, improve glycemic control and prevent progression to Type 2 diabetes. She would prefer to keep working on her plan as has made significant progress, down a total of 23 lbs!!!  Lab Results  Component Value Date   HGBA1C 6.3 10/10/2022   HGBA1C 6.1 (H) 12/24/2021   HGBA1C 6.2 07/09/2021   HGBA1C 6.1 05/07/2020   Lab Results  Component Value Date   INSULIN 10.8 12/24/2021    Plan:  Continue working on nutrition plan to decrease simple carbohydrates, increase lean proteins and exercise to promote weight loss, improve glycemic control and prevent progression to Type 2 diabetes.    Other depression/emotional  eating Lazelle has had issues with stress/emotional eating. Currently this is moderately controlled. Overall mood is stable. Medication(s): Bupropion SR 150 mg daily in am  Plan: Continue Bupropion SR 150 mg daily in am Continue working on emotional eating strategies.   ATTESTASTION STATEMENTS:  Reviewed by clinician on day of visit: allergies, medications, problem list, medical history, surgical history, family history, social history, and previous encounter notes.   I have personally spent 42 minutes total time today in preparation, patient care, nutritional counseling and documentation for this visit, including the following: review of clinical lab tests; review of medical tests/procedures/services.      Helaine Yackel, PA-C

## 2022-11-04 DIAGNOSIS — K219 Gastro-esophageal reflux disease without esophagitis: Secondary | ICD-10-CM | POA: Diagnosis not present

## 2022-11-04 DIAGNOSIS — Z86010 Personal history of colonic polyps: Secondary | ICD-10-CM | POA: Diagnosis not present

## 2022-11-04 DIAGNOSIS — Z800 Family history of malignant neoplasm of digestive organs: Secondary | ICD-10-CM | POA: Diagnosis not present

## 2022-11-04 DIAGNOSIS — E039 Hypothyroidism, unspecified: Secondary | ICD-10-CM | POA: Diagnosis not present

## 2022-11-04 DIAGNOSIS — D509 Iron deficiency anemia, unspecified: Secondary | ICD-10-CM | POA: Diagnosis not present

## 2022-11-04 DIAGNOSIS — I10 Essential (primary) hypertension: Secondary | ICD-10-CM | POA: Diagnosis not present

## 2022-11-04 DIAGNOSIS — K625 Hemorrhage of anus and rectum: Secondary | ICD-10-CM | POA: Diagnosis not present

## 2022-11-04 DIAGNOSIS — K5900 Constipation, unspecified: Secondary | ICD-10-CM | POA: Diagnosis not present

## 2022-11-21 ENCOUNTER — Other Ambulatory Visit (INDEPENDENT_AMBULATORY_CARE_PROVIDER_SITE_OTHER): Payer: Medicare Other

## 2022-11-21 DIAGNOSIS — E785 Hyperlipidemia, unspecified: Secondary | ICD-10-CM

## 2022-11-21 LAB — HEPATIC FUNCTION PANEL
ALT: 19 U/L (ref 0–35)
AST: 19 U/L (ref 0–37)
Albumin: 4.1 g/dL (ref 3.5–5.2)
Alkaline Phosphatase: 83 U/L (ref 39–117)
Bilirubin, Direct: 0.1 mg/dL (ref 0.0–0.3)
Total Bilirubin: 0.4 mg/dL (ref 0.2–1.2)
Total Protein: 7.1 g/dL (ref 6.0–8.3)

## 2022-11-21 LAB — LIPID PANEL
Cholesterol: 178 mg/dL (ref 0–200)
HDL: 50.6 mg/dL (ref >39.00)
LDL Cholesterol: 106 mg/dL — ABNORMAL HIGH (ref 0–99)
NonHDL: 127.73
Total CHOL/HDL Ratio: 4
Triglycerides: 107 mg/dL (ref 0.0–149.0)
VLDL: 21.4 mg/dL (ref 0.0–40.0)

## 2022-12-02 ENCOUNTER — Ambulatory Visit (INDEPENDENT_AMBULATORY_CARE_PROVIDER_SITE_OTHER): Payer: Medicare Other | Admitting: Physician Assistant

## 2022-12-02 ENCOUNTER — Encounter (INDEPENDENT_AMBULATORY_CARE_PROVIDER_SITE_OTHER): Payer: Self-pay | Admitting: Physician Assistant

## 2022-12-02 VITALS — BP 115/70 | HR 67 | Temp 98.3°F | Ht 61.0 in | Wt 202.0 lb

## 2022-12-02 DIAGNOSIS — I10 Essential (primary) hypertension: Secondary | ICD-10-CM | POA: Diagnosis not present

## 2022-12-02 DIAGNOSIS — Z6838 Body mass index (BMI) 38.0-38.9, adult: Secondary | ICD-10-CM

## 2022-12-02 DIAGNOSIS — R7303 Prediabetes: Secondary | ICD-10-CM

## 2022-12-02 DIAGNOSIS — F3289 Other specified depressive episodes: Secondary | ICD-10-CM

## 2022-12-02 MED ORDER — BUPROPION HCL ER (SR) 200 MG PO TB12
200.0000 mg | ORAL_TABLET | Freq: Every day | ORAL | 2 refills | Status: DC
Start: 2022-12-02 — End: 2022-12-30

## 2022-12-02 NOTE — Progress Notes (Signed)
.smr  Office: [phone]  /  Fax: [phone]  WEIGHT SUMMARY AND BIOMETRICS  Vitals Temp: 98.3 F (36.8 C) BP: 115/70 Pulse Rate: 67 SpO2: 98 %   Anthropometric Measurements Height: 5\' 1"  (1.549 m) Weight: 202 lb (91.6 kg) BMI (Calculated): 38.19 Weight at Last Visit: 200 lb Weight Lost Since Last Visit: 0 Weight Gained Since Last Visit: 2 lb Starting Weight: 223 lb Total Weight Loss (lbs): 21 lb (9.526 kg) Peak Weight: 238 lb   Body Composition  Body Fat %: 46.5 % Fat Mass (lbs): 94 lbs Muscle Mass (lbs): 102.6 lbs Total Body Water (lbs): 69.6 lbs Visceral Fat Rating : 16   Other Clinical Data Fasting: no Labs: no Today's Visit #: 15 Starting Date: 12/24/21     HPI  Chief Complaint: OBESITY  Olivia Burns is here to discuss her progress with her obesity treatment plan. She is on the the Category 2 Plan and states she is following her eating plan approximately 60 % of the time. She states she is exercising walking dog, 20-30  minutes 7 times per week. And rowing machine 20-30 minutes 2 times weekly.    Interval History:  Since last office visit she up 2 lbs. Struggling with low energy, not sleeping well and comfort eating over the past few weeks.  Friend had surgery and she was feeling guilty that she did not do enough to help her friend.   Hunger/appetite-moderate control. Some skipping meals/ low energy over the past month. We discussed the importance of regular meals and adequate protein intake for weight loss and overall health. Cravings- increased cravings for sweet and salty/comfort eating over the past month.  Stress- decreased mood and energy over the past month.  Sleep- not restorative. Had sleep study previously and was told she has mild sleep apnea, but did not need CPAP. Has taken a OTC sleep aide from [employer] with melatonin and this helped her stay asleep or go back to sleep more easily and we discussed taking this on a regular basis for a  short time to improve sleep.  Exercise-walking dog- "Princess Tigerlilly", but has difficulty time with walking in hot weather. Trying to walk later in evenings.     Pharmacotherapy: bupropion for emotional eating. No side effects, but noting increased cravings lately. Notes some decreased energy and mood lately. She has been on same dose for nearly 1 year. Will increase dose and monitor closely.   TREATMENT PLAN FOR OBESITY:  Recommended Dietary Goals  Olivia Burns is currently in the action stage of change. As such, her goal is to continue weight management plan. She has agreed to the Category 2 Plan.  Behavioral Intervention  We discussed the following Behavioral Modification Strategies today: increasing lean protein intake, decreasing simple carbohydrates , increasing vegetables, increasing lower glycemic fruits, avoiding skipping meals, increasing water intake, emotional eating strategies and understanding the difference between hunger signals and cravings, work on managing stress, creating time for self-care and relaxation measures, continue to practice mindfulness when eating, and planning for success.  Additional resources provided today: NA  Recommended Physical Activity Goals  Olivia Burns has been advised to work up to 150 minutes of moderate intensity aerobic activity a week and strengthening exercises 2-3 times per week for cardiovascular health, weight loss maintenance and preservation of muscle mass.   She has agreed to Continue current level of physical activity  and Think about ways to increase daily physical activity and overcoming barriers to exercise   Pharmacotherapy We discussed various medication  options to help Olivia Burns with her weight loss efforts and we both agreed to increase bupropion to 200 mg daily for emotional eating/cravings and referral to Dr. Dewaine Conger.    Return in about 4 weeks (around 12/30/2022).Marland Kitchen She was informed of the importance of frequent follow up visits to  maximize her success with intensive lifestyle modifications for her multiple health conditions.  PHYSICAL EXAM:  Blood pressure 115/70, pulse 67, temperature 98.3 F (36.8 C), height 5\' 1"  (1.549 m), weight 202 lb (91.6 kg), SpO2 98 %. Body mass index is 38.17 kg/m.  General: She is overweight, cooperative, alert, well developed, and in no acute distress. PSYCH: Has normal mood, affect and thought process.   Cardiovascular: HR 60's regular, BP 115/70 Lungs: Normal breathing effort, no conversational dyspnea.  DIAGNOSTIC DATA REVIEWED:  BMET    Component Value Date/Time   NA 138 10/10/2022 0956   NA 141 12/24/2021 1412   K 4.4 10/10/2022 0956   CL 100 10/10/2022 0956   CO2 31 10/10/2022 0956   GLUCOSE 102 (H) 10/10/2022 0956   BUN 13 10/10/2022 0956   BUN 13 12/24/2021 1412   CREATININE 0.91 10/10/2022 0956   CALCIUM 9.4 10/10/2022 0956   Lab Results  Component Value Date   HGBA1C 6.3 10/10/2022   HGBA1C 6.1 05/07/2020   Lab Results  Component Value Date   INSULIN 10.8 12/24/2021   Lab Results  Component Value Date   TSH 2.06 07/09/2021   CBC    Component Value Date/Time   WBC 4.1 12/24/2021 1412   RBC 4.39 12/24/2021 1412   HGB 12.0 12/24/2021 1412   HCT 35.9 12/24/2021 1412   PLT 284 12/24/2021 1412   MCV 82 12/24/2021 1412   MCH 27.3 12/24/2021 1412   MCHC 33.4 12/24/2021 1412   RDW 13.3 12/24/2021 1412   Iron Studies    Component Value Date/Time   IRON 54 02/06/2022 1015   TIBC 310.8 02/06/2022 1015   FERRITIN 40.1 02/06/2022 1015   IRONPCTSAT 17.4 (L) 02/06/2022 1015   Lipid Panel     Component Value Date/Time   CHOL 178 11/21/2022 0914   TRIG 107.0 11/21/2022 0914   HDL 50.60 11/21/2022 0914   CHOLHDL 4 11/21/2022 0914   VLDL 21.4 11/21/2022 0914   LDLCALC 106 (H) 11/21/2022 0914   Hepatic Function Panel     Component Value Date/Time   PROT 7.1 11/21/2022 0914   PROT 7.0 12/24/2021 1412   ALBUMIN 4.1 11/21/2022 0914   ALBUMIN 4.3  12/24/2021 1412   AST 19 11/21/2022 0914   ALT 19 11/21/2022 0914   ALKPHOS 83 11/21/2022 0914   BILITOT 0.4 11/21/2022 0914   BILITOT <0.2 12/24/2021 1412   BILIDIR 0.1 11/21/2022 0914      Component Value Date/Time   TSH 2.06 07/09/2021 0859   Nutritional Lab Results  Component Value Date   VD25OH 70.78 10/07/2021   VD25OH 117.36 (HH) 07/09/2021    ASSOCIATED CONDITIONS ADDRESSED TODAY  ASSESSMENT AND PLAN  Problem List Items Addressed This Visit     Essential hypertension - Primary   Prediabetes   Depression   Relevant Medications   buPROPion (WELLBUTRIN SR) 200 MG 12 hr tablet   Obesity with starting BMI of 42.1   Hypertension Hypertension well controlled, asymptomatic, and no significant medication side effects noted.  Medication(s): valsartan- hydrochlorothiazide 80-12.5 mg once daily   Renal function is low normal range.   BP Readings from Last 3 Encounters:  12/02/22 115/70  11/03/22  102/64  10/10/22 114/68   Lab Results  Component Value Date   CREATININE 0.91 10/10/2022   CREATININE 0.87 12/24/2021   CREATININE 0.88 07/09/2021   Lab Results  Component Value Date   GFR 63.04 10/10/2022   GFR 66.21 07/09/2021   GFR 66.75 05/07/2020    Plan: Continue all antihypertensives at current dosages. Continue to work on nutrition plan to promote weight loss and improve BP control.   Prediabetes Last A1c was 6.3- slightly worse.   Medication(s): None Polyphagia:Yes She is working on nutrition plan to decrease simple carbohydrates, increase lean proteins and exercise to promote weight loss, improve glycemic control and prevent progression to Type 2 diabetes. She wants to continue to try and control with dietary measures/exercise.   Lab Results  Component Value Date   HGBA1C 6.3 10/10/2022   HGBA1C 6.1 (H) 12/24/2021   HGBA1C 6.2 07/09/2021   HGBA1C 6.1 05/07/2020   Lab Results  Component Value Date   INSULIN 10.8 12/24/2021    Plan:  Continue  working on nutrition plan to decrease simple carbohydrates, increase lean proteins and exercise to promote weight loss, improve glycemic control and prevent progression to Type 2 diabetes.  Plan to recheck labs in 2-3 months.    Other depression/emotional eating Felicity has had issues with stress/emotional eating. Currently this is poorly controlled. Overall mood is stable. Medication(s): Bupropion SR 150 mg daily in am No side effects with bupropion. Has been on same dose for past year and initially felt was helpful, but less so more recently.  Plan: Continue and increase dose Bupropion SR 200 mg daily in am Referral to Dr. Dewaine Conger for emotional eating behaviors.  Monitor closely.   ATTESTASTION STATEMENTS:  Reviewed by clinician on day of visit: allergies, medications, problem list, medical history, surgical history, family history, social history, and previous encounter notes.   I have personally spent 44 minutes total time today in preparation, patient care, nutritional counseling and documentation for this visit, including the following: review of clinical lab tests; review of medical tests/procedures/services.      Chanele Douglas, PA-C

## 2022-12-07 ENCOUNTER — Other Ambulatory Visit (INDEPENDENT_AMBULATORY_CARE_PROVIDER_SITE_OTHER): Payer: Self-pay | Admitting: Family Medicine

## 2022-12-07 DIAGNOSIS — F3289 Other specified depressive episodes: Secondary | ICD-10-CM

## 2022-12-17 DIAGNOSIS — K635 Polyp of colon: Secondary | ICD-10-CM | POA: Diagnosis not present

## 2022-12-17 DIAGNOSIS — Z1211 Encounter for screening for malignant neoplasm of colon: Secondary | ICD-10-CM | POA: Diagnosis not present

## 2022-12-17 DIAGNOSIS — D509 Iron deficiency anemia, unspecified: Secondary | ICD-10-CM | POA: Diagnosis not present

## 2022-12-17 DIAGNOSIS — K219 Gastro-esophageal reflux disease without esophagitis: Secondary | ICD-10-CM | POA: Diagnosis not present

## 2022-12-17 DIAGNOSIS — Z86010 Personal history of colonic polyps: Secondary | ICD-10-CM | POA: Diagnosis not present

## 2022-12-17 DIAGNOSIS — K317 Polyp of stomach and duodenum: Secondary | ICD-10-CM | POA: Diagnosis not present

## 2022-12-17 DIAGNOSIS — D123 Benign neoplasm of transverse colon: Secondary | ICD-10-CM | POA: Diagnosis not present

## 2022-12-17 LAB — HM COLONOSCOPY

## 2022-12-22 ENCOUNTER — Ambulatory Visit (INDEPENDENT_AMBULATORY_CARE_PROVIDER_SITE_OTHER): Payer: Medicare Other | Admitting: Physician Assistant

## 2022-12-25 ENCOUNTER — Other Ambulatory Visit (INDEPENDENT_AMBULATORY_CARE_PROVIDER_SITE_OTHER): Payer: Self-pay | Admitting: Physician Assistant

## 2022-12-25 DIAGNOSIS — F3289 Other specified depressive episodes: Secondary | ICD-10-CM

## 2022-12-30 ENCOUNTER — Encounter (INDEPENDENT_AMBULATORY_CARE_PROVIDER_SITE_OTHER): Payer: Self-pay | Admitting: Physician Assistant

## 2022-12-30 ENCOUNTER — Ambulatory Visit (INDEPENDENT_AMBULATORY_CARE_PROVIDER_SITE_OTHER): Payer: Medicare Other | Admitting: Physician Assistant

## 2022-12-30 VITALS — BP 108/69 | HR 76 | Temp 98.1°F | Ht 61.0 in | Wt 201.0 lb

## 2022-12-30 DIAGNOSIS — Z6838 Body mass index (BMI) 38.0-38.9, adult: Secondary | ICD-10-CM | POA: Diagnosis not present

## 2022-12-30 DIAGNOSIS — I10 Essential (primary) hypertension: Secondary | ICD-10-CM | POA: Diagnosis not present

## 2022-12-30 DIAGNOSIS — F3289 Other specified depressive episodes: Secondary | ICD-10-CM

## 2022-12-30 DIAGNOSIS — E669 Obesity, unspecified: Secondary | ICD-10-CM

## 2022-12-30 DIAGNOSIS — R7303 Prediabetes: Secondary | ICD-10-CM

## 2022-12-30 DIAGNOSIS — F32A Depression, unspecified: Secondary | ICD-10-CM

## 2022-12-30 MED ORDER — BUPROPION HCL ER (SR) 200 MG PO TB12
200.0000 mg | ORAL_TABLET | Freq: Every day | ORAL | 2 refills | Status: DC
Start: 2022-12-30 — End: 2023-01-27

## 2022-12-30 NOTE — Progress Notes (Signed)
.smr  Office: [phone]  /  Fax: [phone]  WEIGHT SUMMARY AND BIOMETRICS  Vitals Temp: 98.1 F (36.7 C) BP: 108/69 Pulse Rate: 76 SpO2: 98 %   Anthropometric Measurements Height: 5\' 1"  (1.549 m) Weight: 201 lb (91.2 kg) BMI (Calculated): 38 Weight at Last Visit: 202 lb Weight Lost Since Last Visit: 1 lb Weight Gained Since Last Visit: 0 Starting Weight: 223 lb Total Weight Loss (lbs): 22 lb (9.979 kg)   Body Composition  Body Fat %: 46.2 % Fat Mass (lbs): 92.8 lbs Muscle Mass (lbs): 102.8 lbs Total Body Water (lbs): 70 lbs Visceral Fat Rating : 15   Other Clinical Data Fasting: yes Labs: no Today's Visit #: 16 Starting Date: 12/24/21     HPI  Chief Complaint: OBESITY  Olivia Burns is here to discuss her progress with her obesity treatment plan. She is on the the Category 2 Plan and states she is following her eating plan approximately 60 % of the time. She states she is exercising walking her dog 20-30 minutes 7 times per week.   Interval History:  Since last office visit she down 1 lb.  Bio impedence scale reviewed with the patient:  Up 0.2 lbs muscle mass  Down 1.2 lbs adipose mass Up 0.4 lbs total body water  Hunger/appetite-moderate control. Occasionally skipping meal but not regularly. We discussed the importance of regular meals and adequate protein intake for weight loss and overall health.  Cravings- decreased cravings with increased bupropion Stress- Better overall.  Sleep- Not consistently restorative Exercise-Feels like she has more energy. Consistently walking Olivia Burns    Pharmacotherapy: bupropion for cravings/emotional eating. Feels helpful and no side effects with bupropion. Had some mild tremor in left hand previously but not reporting issues currently.   TREATMENT PLAN FOR OBESITY:  Recommended Dietary Goals  Olivia Burns is currently in the action stage of change. As such, her goal is to continue weight management  plan. She has agreed to the Category 2 Plan.  Behavioral Intervention  We discussed the following Behavioral Modification Strategies today: increasing lean protein intake, decreasing simple carbohydrates , increasing vegetables, increasing lower glycemic fruits, avoiding skipping meals, increasing water intake, emotional eating strategies and understanding the difference between hunger signals and cravings, continue to practice mindfulness when eating, and planning for success.  Additional resources provided today: NA  Recommended Physical Activity Goals  Olivia Burns has been advised to work up to 150 minutes of moderate intensity aerobic activity a week and strengthening exercises 2-3 times per week for cardiovascular health, weight loss maintenance and preservation of muscle mass.   She has agreed to Continue current level of physical activity  and Start using rowing machine 2-3 times weekly    Pharmacotherapy We discussed various medication options to help Olivia Burns with her weight loss efforts and we both agreed to continue bupropion 200 mg daily for emotional eating/cravings.    Return in about 4 weeks (around 01/27/2023).Marland Kitchen She was informed of the importance of frequent follow up visits to maximize her success with intensive lifestyle modifications for her multiple health conditions.  PHYSICAL EXAM:  Blood pressure 108/69, pulse 76, temperature 98.1 F (36.7 C), height 5\' 1"  (1.549 m), weight 201 lb (91.2 kg), SpO2 98%. Body mass index is 37.98 kg/m.  General: She is overweight, cooperative, alert, well developed, and in no acute distress. PSYCH: Has normal mood, affect and thought process.   Cardiovascular: HR 70's regular, BP 108/69 Lungs: Normal breathing effort, no conversational dyspnea. Neuro: no focal deficits  DIAGNOSTIC DATA REVIEWED:  BMET    Component Value Date/Time   NA 138 10/10/2022 0956   NA 141 12/24/2021 1412   K 4.4 10/10/2022 0956   CL 100 10/10/2022 0956    CO2 31 10/10/2022 0956   GLUCOSE 102 (H) 10/10/2022 0956   BUN 13 10/10/2022 0956   BUN 13 12/24/2021 1412   CREATININE 0.91 10/10/2022 0956   CALCIUM 9.4 10/10/2022 0956   Lab Results  Component Value Date   HGBA1C 6.3 10/10/2022   HGBA1C 6.1 05/07/2020   Lab Results  Component Value Date   INSULIN 10.8 12/24/2021   Lab Results  Component Value Date   TSH 2.06 07/09/2021   CBC    Component Value Date/Time   WBC 4.1 12/24/2021 1412   RBC 4.39 12/24/2021 1412   HGB 12.0 12/24/2021 1412   HCT 35.9 12/24/2021 1412   PLT 284 12/24/2021 1412   MCV 82 12/24/2021 1412   MCH 27.3 12/24/2021 1412   MCHC 33.4 12/24/2021 1412   RDW 13.3 12/24/2021 1412   Iron Studies    Component Value Date/Time   IRON 54 02/06/2022 1015   TIBC 310.8 02/06/2022 1015   FERRITIN 40.1 02/06/2022 1015   IRONPCTSAT 17.4 (L) 02/06/2022 1015   Lipid Panel     Component Value Date/Time   CHOL 178 11/21/2022 0914   TRIG 107.0 11/21/2022 0914   HDL 50.60 11/21/2022 0914   CHOLHDL 4 11/21/2022 0914   VLDL 21.4 11/21/2022 0914   LDLCALC 106 (H) 11/21/2022 0914   Hepatic Function Panel     Component Value Date/Time   PROT 7.1 11/21/2022 0914   PROT 7.0 12/24/2021 1412   ALBUMIN 4.1 11/21/2022 0914   ALBUMIN 4.3 12/24/2021 1412   AST 19 11/21/2022 0914   ALT 19 11/21/2022 0914   ALKPHOS 83 11/21/2022 0914   BILITOT 0.4 11/21/2022 0914   BILITOT <0.2 12/24/2021 1412   BILIDIR 0.1 11/21/2022 0914      Component Value Date/Time   TSH 2.06 07/09/2021 0859   Nutritional Lab Results  Component Value Date   VD25OH 70.78 10/07/2021   VD25OH 117.36 (HH) 07/09/2021    ASSOCIATED CONDITIONS ADDRESSED TODAY  ASSESSMENT AND PLAN  Problem List Items Addressed This Visit     Essential hypertension   Prediabetes - Primary   Depression   Relevant Medications   buPROPion (WELLBUTRIN SR) 200 MG 12 hr tablet   Obesity with starting BMI of 42.1   BMI 38.0-38.9,adult   Prediabetes Last A1c  was 6.3 - not at goal.  Medication(s): None Polyphagia:No She is working on nutrition plan to decrease simple carbohydrates, increase lean proteins and exercise to promote weight loss, improve glycemic control and prevent progression to Type 2 diabetes. She is down a total of 22 lbs and would prefer to continue to work on nutrition at this point.   Lab Results  Component Value Date   HGBA1C 6.3 10/10/2022   HGBA1C 6.1 (H) 12/24/2021   HGBA1C 6.2 07/09/2021   HGBA1C 6.1 05/07/2020   Lab Results  Component Value Date   INSULIN 10.8 12/24/2021    Plan:  Continue working on nutrition plan to decrease simple carbohydrates, increase lean proteins and exercise to promote weight loss, improve glycemic control and prevent progression to Type 2 diabetes.  Will plan to recheck fasting labs in 2 months.    Hypertension Hypertension improved, asymptomatic, and no significant medication side effects noted.  Medication(s): valsartan-hydrochlorothiazide 80-12.5 mg daily Renal function is  normal.   BP Readings from Last 3 Encounters:  12/30/22 108/69  12/02/22 115/70  11/03/22 102/64   Lab Results  Component Value Date   CREATININE 0.91 10/10/2022   CREATININE 0.87 12/24/2021   CREATININE 0.88 07/09/2021   Lab Results  Component Value Date   GFR 63.04 10/10/2022   GFR 66.21 07/09/2021   GFR 66.75 05/07/2020    Plan: Continue all antihypertensives at current dosages. Continue to work on nutrition plan to promote weight loss and improve BP control.    Other depression/emotional eating Olivia Burns has had issues with stress/emotional eating. Currently this is moderately controlled. Overall mood is stable. Medication(s): Bupropion SR 200 mg daily in am No side effects with bupropion.  She does feel mood is better overall and she has some increased energy lately. She feels is helpful with cravings again after increased dose last month.   Plan: Continue and refill Bupropion SR 200 mg  daily in am She has an appointment with Dr. Dewaine Conger 8/13 as well to address emotional eating behaviors.  We discussed other strategies for emotional eating/comfort eating today and she will try over the next couple of weeks. Previously enjoyed crocheting but has not been doing more recently.   ATTESTASTION STATEMENTS:  Reviewed by clinician on day of visit: allergies, medications, problem list, medical history, surgical history, family history, social history, and previous encounter notes.   I have personally spent 42 minutes total time today in preparation, patient care, nutritional counseling and documentation for this visit, including the following: review of clinical lab tests; review of medical tests/procedures/services.      Finn Altemose, PA-C

## 2022-12-31 ENCOUNTER — Encounter: Payer: Self-pay | Admitting: Family Medicine

## 2023-01-01 DIAGNOSIS — D509 Iron deficiency anemia, unspecified: Secondary | ICD-10-CM | POA: Diagnosis not present

## 2023-01-01 DIAGNOSIS — R1310 Dysphagia, unspecified: Secondary | ICD-10-CM | POA: Diagnosis not present

## 2023-01-01 DIAGNOSIS — K5904 Chronic idiopathic constipation: Secondary | ICD-10-CM | POA: Diagnosis not present

## 2023-01-01 DIAGNOSIS — K219 Gastro-esophageal reflux disease without esophagitis: Secondary | ICD-10-CM | POA: Diagnosis not present

## 2023-01-12 ENCOUNTER — Telehealth (INDEPENDENT_AMBULATORY_CARE_PROVIDER_SITE_OTHER): Payer: Medicare Other | Admitting: Psychology

## 2023-01-12 DIAGNOSIS — F439 Reaction to severe stress, unspecified: Secondary | ICD-10-CM

## 2023-01-12 DIAGNOSIS — F5089 Other specified eating disorder: Secondary | ICD-10-CM | POA: Diagnosis not present
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
## 2023-01-22 ENCOUNTER — Other Ambulatory Visit: Payer: Self-pay | Admitting: Gastroenterology

## 2023-01-26 NOTE — Progress Notes (Unsigned)
.smr  Office: [phone]  /  Fax: [phone]  WEIGHT SUMMARY AND BIOMETRICS  Vitals Temp: 98.2 F (36.8 C) BP: 102/65 Pulse Rate: 69 SpO2: 99 %   Anthropometric Measurements Height: 5\' 1"  (1.549 m) Weight: 200 lb (90.7 kg) BMI (Calculated): 37.81 Weight at Last Visit: 201 lb Weight Lost Since Last Visit: 1 lb Weight Gained Since Last Visit: 0 Starting Weight: 223 lb Total Weight Loss (lbs): 23 lb (10.4 kg) Peak Weight: 238 lb Waist Measurement : 44 inches   Body Composition  Body Fat %: 46.1 % Fat Mass (lbs): 92.4 lbs Muscle Mass (lbs): 102.8 lbs Total Body Water (lbs): 68.8 lbs Visceral Fat Rating : 15   Other Clinical Data Fasting: no Labs: no Today's Visit #: 17 Starting Date: 12/24/21     HPI  Chief Complaint: OBESITY  Olivia Burns is here to discuss her progress with her obesity treatment plan. She is on the {MWMwtlossportion/plan2:23431} and states she is following her eating plan approximately *** % of the time. She states she is exercising *** minutes *** times per week.  Discussed the use of AI scribe software for clinical note transcription with the patient, who gave verbal consent to proceed.  History of Present Illness        The patient, with a history of obesity, prediabetes, hypertension, and emotional eating behavior, presents for a follow-up visit. She reports a weight loss of 23 pounds, which she attributes to changes in diet and medication. She has been focusing on increasing protein intake and has noticed a decrease in cravings. She has also been trying to increase her fiber intake to manage constipation, which she has been experiencing for the past few days. She reports that her knee pain has been a barrier to increasing physical activity. She is on medication for hypertension and cholesterol, which she reports no issues with. She also reports inadequate fluid intake, which she is trying to improve.  Interval History:  Since last office  visit she *** Hunger/appetite-*** Cravings- *** Stress- *** Sleep- *** Exercise-*** Hydration-***   Pharmacotherapy: ***  TREATMENT PLAN FOR OBESITY:  Recommended Dietary Goals  Olivia Burns is currently in the action stage of change. As such, her goal is to continue weight management plan. She has agreed to {MWMwtlossportion/plan2:23431}.  Behavioral Intervention  We discussed the following Behavioral Modification Strategies today: {EMWMwtlossstrategies:28914::"increasing lean protein intake","decreasing simple carbohydrates ","increasing vegetables","increasing lower glycemic fruits","increasing water intake","continue to practice mindfulness when eating","planning for success"}.  Additional resources provided today: NA  Recommended Physical Activity Goals  Olivia Burns has been advised to work up to 150 minutes of moderate intensity aerobic activity a week and strengthening exercises 2-3 times per week for cardiovascular health, weight loss maintenance and preservation of muscle mass.   She has agreed to {EMEXERCISE:28847::"Think about ways to increase daily physical activity and overcoming barriers to exercise"}   Pharmacotherapy We discussed various medication options to help Olivia Burns with her weight loss efforts and we both agreed to ***.    Return in about 4 weeks (around 02/24/2023).Marland Kitchen She was informed of the importance of frequent follow up visits to maximize her success with intensive lifestyle modifications for her multiple health conditions.  PHYSICAL EXAM:  Blood pressure 102/65, pulse 69, temperature 98.2 F (36.8 C), height 5\' 1"  (1.549 m), weight 200 lb (90.7 kg), SpO2 99%. Body mass index is 37.79 kg/m.  General: She is overweight, cooperative, alert, well developed, and in no acute distress. PSYCH: Has normal mood, affect and thought process.   Lungs:  Normal breathing effort, no conversational dyspnea.  DIAGNOSTIC DATA REVIEWED:  BMET    Component Value Date/Time    NA 138 10/10/2022 0956   NA 141 12/24/2021 1412   K 4.4 10/10/2022 0956   CL 100 10/10/2022 0956   CO2 31 10/10/2022 0956   GLUCOSE 102 (H) 10/10/2022 0956   BUN 13 10/10/2022 0956   BUN 13 12/24/2021 1412   CREATININE 0.91 10/10/2022 0956   CALCIUM 9.4 10/10/2022 0956   Lab Results  Component Value Date   HGBA1C 6.3 10/10/2022   HGBA1C 6.1 05/07/2020   Lab Results  Component Value Date   INSULIN 10.8 12/24/2021   Lab Results  Component Value Date   TSH 2.06 07/09/2021   CBC    Component Value Date/Time   WBC 4.1 12/24/2021 1412   RBC 4.39 12/24/2021 1412   HGB 12.0 12/24/2021 1412   HCT 35.9 12/24/2021 1412   PLT 284 12/24/2021 1412   MCV 82 12/24/2021 1412   MCH 27.3 12/24/2021 1412   MCHC 33.4 12/24/2021 1412   RDW 13.3 12/24/2021 1412   Iron Studies    Component Value Date/Time   IRON 54 02/06/2022 1015   TIBC 310.8 02/06/2022 1015   FERRITIN 40.1 02/06/2022 1015   IRONPCTSAT 17.4 (L) 02/06/2022 1015   Lipid Panel     Component Value Date/Time   CHOL 178 11/21/2022 0914   TRIG 107.0 11/21/2022 0914   HDL 50.60 11/21/2022 0914   CHOLHDL 4 11/21/2022 0914   VLDL 21.4 11/21/2022 0914   LDLCALC 106 (H) 11/21/2022 0914   Hepatic Function Panel     Component Value Date/Time   PROT 7.1 11/21/2022 0914   PROT 7.0 12/24/2021 1412   ALBUMIN 4.1 11/21/2022 0914   ALBUMIN 4.3 12/24/2021 1412   AST 19 11/21/2022 0914   ALT 19 11/21/2022 0914   ALKPHOS 83 11/21/2022 0914   BILITOT 0.4 11/21/2022 0914   BILITOT <0.2 12/24/2021 1412   BILIDIR 0.1 11/21/2022 0914      Component Value Date/Time   TSH 2.06 07/09/2021 0859   Nutritional Lab Results  Component Value Date   VD25OH 70.78 10/07/2021   VD25OH 117.36 (HH) 07/09/2021    ASSOCIATED CONDITIONS ADDRESSED TODAY  ASSESSMENT AND PLAN  Problem List Items Addressed This Visit     Essential hypertension   Prediabetes - Primary   Depression   Relevant Medications   buPROPion (WELLBUTRIN SR)  200 MG 12 hr tablet   Obesity with starting BMI of 42.1   Obesity Continued weight loss with a total of 23 pounds lost. Improved mood and productivity with increased dose of Wellbutrin. Still some issues with cravings and inconsistent protein intake. -Increase focus on protein intake. -Continue Wellbutrin as prescribed. -Consider use of protein bars for sweet cravings. -Increase physical activity, particularly walking.  Hypertension Blood pressure well controlled on current medication (Valsartan/Hydrochlorothiazide). -Continue current medication. -Increase fluid intake to at least 64-80 ounces daily.  Prediabetes No specific discussion in this visit. -Continue current management.  Constipation Recent issues with constipation, possibly related to inadequate fiber and fluid intake, and decreased physical activity. -Increase fiber intake, particularly from fresh fruits and vegetables. -Increase fluid intake. -Resume regular physical activity.  Emotional Eating Improved with increased dose of Wellbutrin. Still some issues with cravings. -Continue Wellbutrin as prescribed. -Consider use of protein bars for sweet cravings.   Prediabetes Last A1c was ***  Medication(s): {dwwpharmacotherapy:29109} Polyphagia:{dwwyes:29172} Lab Results  Component Value Date   HGBA1C 6.3 10/10/2022  HGBA1C 6.1 (H) 12/24/2021   HGBA1C 6.2 07/09/2021   HGBA1C 6.1 05/07/2020   Lab Results  Component Value Date   INSULIN 10.8 12/24/2021    Plan: {dwwmed:29123} {dwwpharmacotherapy:29109}  Hypertension Hypertension {disease control degree:315147}.  Medication(s): ***  BP Readings from Last 3 Encounters:  01/27/23 102/65  12/30/22 108/69  12/02/22 115/70   Lab Results  Component Value Date   CREATININE 0.91 10/10/2022   CREATININE 0.87 12/24/2021   CREATININE 0.88 07/09/2021   Lab Results  Component Value Date   GFR 63.04 10/10/2022   GFR 66.21 07/09/2021   GFR 66.75 05/07/2020     Plan: Continue all antihypertensives at current dosages.  {dwwee:29148}/emotional eating Olivia Burns has had issues with stress/emotional eating. Currently this is {EWCONTROLASSESSMENT:24261}. Overall mood {ACTION; IS/IS NOT:21021397} stable. Medication(s): {dwweemeds:29150}  Plan: {dwwmed:29123} {dwweemeds:29150}  ATTESTASTION STATEMENTS:  Reviewed by clinician on day of visit: allergies, medications, problem list, medical history, surgical history, family history, social history, and previous encounter notes.   I have personally spent 30 minutes total time today in preparation, patient care, nutritional counseling and documentation for this visit, including the following: review of clinical lab tests; review of medical tests/procedures/services.      Olivia Cathy, PA-C

## 2023-01-27 ENCOUNTER — Ambulatory Visit (INDEPENDENT_AMBULATORY_CARE_PROVIDER_SITE_OTHER): Payer: Medicare Other | Admitting: Physician Assistant

## 2023-01-27 ENCOUNTER — Encounter (INDEPENDENT_AMBULATORY_CARE_PROVIDER_SITE_OTHER): Payer: Self-pay | Admitting: Physician Assistant

## 2023-01-27 VITALS — BP 102/65 | HR 69 | Temp 98.2°F | Ht 61.0 in | Wt 200.0 lb

## 2023-01-27 DIAGNOSIS — Z6837 Body mass index (BMI) 37.0-37.9, adult: Secondary | ICD-10-CM | POA: Diagnosis not present

## 2023-01-27 DIAGNOSIS — R7303 Prediabetes: Secondary | ICD-10-CM | POA: Diagnosis not present

## 2023-01-27 DIAGNOSIS — K5900 Constipation, unspecified: Secondary | ICD-10-CM | POA: Diagnosis not present

## 2023-01-27 DIAGNOSIS — E669 Obesity, unspecified: Secondary | ICD-10-CM

## 2023-01-27 DIAGNOSIS — F3289 Other specified depressive episodes: Secondary | ICD-10-CM

## 2023-01-27 DIAGNOSIS — I10 Essential (primary) hypertension: Secondary | ICD-10-CM | POA: Diagnosis not present

## 2023-01-27 MED ORDER — BUPROPION HCL ER (SR) 200 MG PO TB12
200.0000 mg | ORAL_TABLET | Freq: Every day | ORAL | 2 refills | Status: DC
Start: 2023-01-27 — End: 2023-06-22

## 2023-01-28 DIAGNOSIS — K5900 Constipation, unspecified: Secondary | ICD-10-CM | POA: Insufficient documentation

## 2023-02-07 ENCOUNTER — Other Ambulatory Visit: Payer: Self-pay | Admitting: Family Medicine

## 2023-02-09 ENCOUNTER — Telehealth (INDEPENDENT_AMBULATORY_CARE_PROVIDER_SITE_OTHER): Payer: Medicare Other | Admitting: Psychology

## 2023-02-09 DIAGNOSIS — F439 Reaction to severe stress, unspecified: Secondary | ICD-10-CM | POA: Diagnosis not present

## 2023-02-09 DIAGNOSIS — F5089 Other specified eating disorder: Secondary | ICD-10-CM

## 2023-02-09 NOTE — Progress Notes (Signed)
  Office: [phone]  /  Fax: [phone]    Date: February 09, 2023  Appointment Start Time: 1:55pm Duration: 26 minutes Provider: Lawerance Cruel, Psy.D. Type of Session: Individual Therapy  Location of Patient: Home (private location) Location of Provider: Provider's Home (private office) Type of Contact: Telepsychological Visit via MyChart Video Visit  Session Content: Olivia Burns is a 72 y.o. female presenting for a follow-up appointment to address the previously established treatment goal of increasing coping skills.Today's appointment was a telepsychological visit. Olivia Burns provided verbal consent for today's telepsychological appointment and she is aware she is responsible for securing confidentiality on her end of the session. Prior to proceeding with today's appointment, Olivia Burns's physical location at the time of this appointment was obtained as well a phone number she could be reached at in the event of technical difficulties. Olivia Burns and this provider participated in today's telepsychological service.   This provider conducted a brief check-in. Olivia Burns shared her dog is sick which has contributed to feeling tired. Regarding eating-related concerns, she noted, "Some of that has settled down a bit." Further explored and processed. Reviewed emotional eating physical hunger. Psychoeducation regarding triggers for emotional eating was provided. Olivia Burns was provided a handout, and encouraged to utilize the handout between now and the next appointment to increase awareness of triggers and frequency. Olivia Burns agreed. This provider also discussed behavioral strategies for specific triggers, such as placing the utensil down when conversing to avoid mindless eating. Olivia Burns provided verbal consent during today's appointment for this provider to send a handout about triggers via e-mail. Overall, Olivia Burns was receptive to today's appointment as evidenced by openness to sharing, responsiveness to feedback, and  willingness to explore triggers for emotional eating.  Mental Status Examination:  Appearance: neat Behavior: appropriate to circumstances Mood: neutral Affect: mood congruent Speech: WNL Eye Contact: appropriate Psychomotor Activity: WNL Gait: unable to assess Thought Process: linear, logical, and goal directed and no evidence or endorsement of suicidal, homicidal, and self-harm ideation, plan and intent  Thought Content/Perception: no hallucinations, delusions, bizarre thinking or behavior endorsed or observed Orientation: AAOx4 Memory/Concentration: memory, attention, language, and fund of knowledge intact  Insight: fair Judgment: fair  Interventions:  Conducted a brief chart review Provided empathic reflections and validation Reviewed content from the previous session Provided positive reinforcement Employed supportive psychotherapy interventions to facilitate reduced distress and to improve coping skills with identified stressors Psychoeducation provided regarding triggers for emotional eating behaviors  DSM-5 Diagnosis(es):  F50.89 Other Specified Feeding or Eating Disorder, Emotional Eating Behaviors and F43.9 Unspecified Trauma- and Stressor-Related Disorder  Treatment Goal & Progress: During the initial appointment with this provider, the following treatment goal was established: increase coping skills. Olivia Burns has demonstrated progress in her goal as evidenced by increased awareness of hunger patterns.   Plan: The next appointment is scheduled for 03/09/2023 at 9:30am, which will be via MyChart Video Visit. The next session will focus on working towards the established treatment goal. Olivia Burns will focus on establishing care with a primary therapist.

## 2023-03-03 ENCOUNTER — Encounter (INDEPENDENT_AMBULATORY_CARE_PROVIDER_SITE_OTHER): Payer: Self-pay | Admitting: Physician Assistant

## 2023-03-03 ENCOUNTER — Ambulatory Visit (INDEPENDENT_AMBULATORY_CARE_PROVIDER_SITE_OTHER): Payer: Medicare Other | Admitting: Physician Assistant

## 2023-03-03 VITALS — BP 107/70 | HR 79 | Temp 98.5°F | Ht 61.0 in | Wt 201.0 lb

## 2023-03-03 DIAGNOSIS — G4700 Insomnia, unspecified: Secondary | ICD-10-CM

## 2023-03-03 DIAGNOSIS — R7303 Prediabetes: Secondary | ICD-10-CM

## 2023-03-03 DIAGNOSIS — Z6838 Body mass index (BMI) 38.0-38.9, adult: Secondary | ICD-10-CM | POA: Diagnosis not present

## 2023-03-03 DIAGNOSIS — F5089 Other specified eating disorder: Secondary | ICD-10-CM | POA: Diagnosis not present

## 2023-03-03 DIAGNOSIS — F3289 Other specified depressive episodes: Secondary | ICD-10-CM

## 2023-03-03 DIAGNOSIS — F5102 Adjustment insomnia: Secondary | ICD-10-CM | POA: Insufficient documentation

## 2023-03-03 NOTE — Progress Notes (Signed)
.smr  Office: [phone]  /  Fax: [phone]  WEIGHT SUMMARY AND BIOMETRICS  Vitals Temp: 98.5 F (36.9 C) BP: 107/70 Pulse Rate: 79 SpO2: 100 %   Anthropometric Measurements Height: 5\' 1"  (1.549 m) Weight: 201 lb (91.2 kg) BMI (Calculated): 38 Weight at Last Visit: 200 lb Weight Lost Since Last Visit: 0 Weight Gained Since Last Visit: 1 lb Starting Weight: 223 lb Total Weight Loss (lbs): 22 lb (9.979 kg) Peak Weight: 238 lb Waist Measurement : 44 inches   Body Composition  Body Fat %: 46.8 % Fat Mass (lbs): 93.6 lbs Muscle Mass (lbs): 102 lbs Total Body Water (lbs): 69 lbs Visceral Fat Rating : 16   Other Clinical Data Fasting: no Labs: no Today's Visit #: 18 Starting Date: 12/24/21     HPI  Chief Complaint: OBESITY  Olivia Burns is here to discuss her progress with her obesity treatment plan. She is on the the Category 2 Plan and states she is following her eating plan approximately 60 % of the time. She states she is exercising walking dog twice daily 15 minutes 7 times per week and rowing machine 20 minutes several times weekly.   Interval History:  Since last office visit she is up 1 lb.   The patient, with a history of obesity, prediabetes, hypertension, and emotional eating behavior, presents for a follow-up visit.  Weight gain of one pound since the last visit but has maintained muscle mass.  The patient admits to being constipated and experiencing increased stress due to a friend's hospitalization and her dogs clinginess.  The patient's dog, Tonna Corner, has also been unwell, causing further stress and sleep disturbances. The patient acknowledges that she has not been consistent with her diet due to these stressors.  Pharmacotherapy: bupropion 200 mg daily for emotional eating/cravings. No side effects.   TREATMENT PLAN FOR OBESITY:  Recommended Dietary Goals  [name] is currently in the action stage of change. As such, her goal is to continue weight  management plan. She has agreed to the Category 2 Plan.  Behavioral Intervention  We discussed the following Behavioral Modification Strategies today: increasing lean protein intake, decreasing simple carbohydrates , increasing vegetables, increasing lower glycemic fruits, avoiding skipping meals, increasing water intake, emotional eating strategies and understanding the difference between hunger signals and cravings, work on managing stress, creating time for self-care and relaxation measures, continue to practice mindfulness when eating, planning for success, and better snacking choices.  Additional resources provided today: NA  Recommended Physical Activity Goals  [name] has been advised to work up to 150 minutes of moderate intensity aerobic activity a week and strengthening exercises 2-3 times per week for cardiovascular health, weight loss maintenance and preservation of muscle mass.   She has agreed to Continue current level of physical activity    Pharmacotherapy We discussed various medication options to help [facility] [employer] with her weight loss efforts and we both agreed to continue Wellbutrin for emotional eating behaviors. Could consider increasing further, but holding off at this time due to increased sleep disturbance lately. May need to consider alternate medications for emotional eating..    Return in about 4 weeks (around 03/31/2023).Marland Kitchen She was informed of the importance of frequent follow up visits to maximize her success with intensive lifestyle modifications for her multiple health conditions.  PHYSICAL EXAM:  Blood pressure 107/70, pulse 79, temperature 98.5 F (36.9 C), height 5\' 1"  (1.549 m), weight 201 lb (91.2 kg), SpO2 100%. Body mass index is 37.98 kg/m.  General: She  is overweight, cooperative, alert, well developed, and in no acute distress. PSYCH: Has normal mood, affect and thought process.   Cardiovascular: HR 70's BP 107/70 Lungs: Normal breathing effort, no  conversational dyspnea. Neuro: no focal deficits  DIAGNOSTIC DATA REVIEWED:  BMET    Component Value Date/Time   NA 138 10/10/2022 0956   NA 141 12/24/2021 1412   K 4.4 10/10/2022 0956   CL 100 10/10/2022 0956   CO2 31 10/10/2022 0956   GLUCOSE 102 (H) 10/10/2022 0956   BUN 13 10/10/2022 0956   BUN 13 12/24/2021 1412   CREATININE 0.91 10/10/2022 0956   CALCIUM 9.4 10/10/2022 0956   Lab Results  Component Value Date   HGBA1C 6.3 10/10/2022   HGBA1C 6.1 05/07/2020   Lab Results  Component Value Date   INSULIN 10.8 12/24/2021   Lab Results  Component Value Date   TSH 2.06 07/09/2021   CBC    Component Value Date/Time   WBC 4.1 12/24/2021 1412   RBC 4.39 12/24/2021 1412   HGB 12.0 12/24/2021 1412   HCT 35.9 12/24/2021 1412   PLT 284 12/24/2021 1412   MCV 82 12/24/2021 1412   MCH 27.3 12/24/2021 1412   MCHC 33.4 12/24/2021 1412   RDW 13.3 12/24/2021 1412   Iron Studies    Component Value Date/Time   IRON 54 02/06/2022 1015   TIBC 310.8 02/06/2022 1015   FERRITIN 40.1 02/06/2022 1015   IRONPCTSAT 17.4 (L) 02/06/2022 1015   Lipid Panel     Component Value Date/Time   CHOL 178 11/21/2022 0914   TRIG 107.0 11/21/2022 0914   HDL 50.60 11/21/2022 0914   CHOLHDL 4 11/21/2022 0914   VLDL 21.4 11/21/2022 0914   LDLCALC 106 (H) 11/21/2022 0914   Hepatic Function Panel     Component Value Date/Time   PROT 7.1 11/21/2022 0914   PROT 7.0 12/24/2021 1412   ALBUMIN 4.1 11/21/2022 0914   ALBUMIN 4.3 12/24/2021 1412   AST 19 11/21/2022 0914   ALT 19 11/21/2022 0914   ALKPHOS 83 11/21/2022 0914   BILITOT 0.4 11/21/2022 0914   BILITOT <0.2 12/24/2021 1412   BILIDIR 0.1 11/21/2022 0914      Component Value Date/Time   TSH 2.06 07/09/2021 0859   Nutritional Lab Results  Component Value Date   VD25OH 70.78 10/07/2021   VD25OH 117.36 (HH) 07/09/2021    ASSOCIATED CONDITIONS ADDRESSED TODAY  ASSESSMENT AND PLAN  Problem List Items Addressed This Visit      Prediabetes - Primary   Depression   BMI 38.0-38.9,adult   Insomnia due to psychological stress  Obesity Slight weight gain since last visit, likely due to stress and emotional eating. Maintaining muscle mass but increase in adipose mass noted. -Continue focus on protein intake and hydration. -Consider strategies for stress management, such as journaling.  Prediabetes Last A1c was 6.3  Medication(s): None Polyphagia:No She is working on nutrition plan to decrease simple carbohydrates, increase lean proteins and exercise to promote weight loss, improve glycemic control and prevent progression to Type 2 diabetes.  She would like to avoid additional medications.   Lab Results  Component Value Date   HGBA1C 6.3 10/10/2022   HGBA1C 6.1 (H) 12/24/2021   HGBA1C 6.2 07/09/2021   HGBA1C 6.1 05/07/2020   Lab Results  Component Value Date   INSULIN 10.8 12/24/2021    Plan:  Continue working on nutrition plan to decrease simple carbohydrates, increase lean proteins and exercise to promote weight loss, improve glycemic  control and prevent progression to Type 2 diabetes.  Recheck A1C over the next 1-2 visits.   Insomnia Reports difficulty sleeping due to stress and rumination. -Consider keeping a bedside journal to write down thoughts that may be preventing sleep. -Explore additional support for stress management, such as therapy in addition to working with Dr. Dewaine Conger on emotional eating behaviors.   Other depression/emotional eating Fayelynn has had issues with stress/emotional eating. Currently this is moderately controlled. Overall mood is stable. Medication(s): Bupropion SR 200 mg daily in am  Plan: Continue Bupropion SR 200 mg daily in am No refill needed as has refill on Rx.  Continue working with Dr. Dewaine Conger on emotional eating behaviors. Consider journaling if having difficulty with sleep.  We discussed considering working with another therapist to address some of the other  stressors the patient is experiencing more recently and she is thinking about pursuing counseling.    Follow-up Next appointment scheduled for October 29th at 9:15am.  ATTESTASTION STATEMENTS:  Reviewed by clinician on day of visit: allergies, medications, problem list, medical history, surgical history, family history, social history, and previous encounter notes.   I have personally spent 36 minutes total time today in preparation, patient care, nutritional counseling and documentation for this visit, including the following: review of clinical lab tests; review of medical tests/procedures/services.      Norine Reddington, PA-C

## 2023-03-09 ENCOUNTER — Telehealth (INDEPENDENT_AMBULATORY_CARE_PROVIDER_SITE_OTHER): Payer: Medicare Other | Admitting: Psychology

## 2023-03-09 DIAGNOSIS — F5089 Other specified eating disorder: Secondary | ICD-10-CM | POA: Diagnosis not present

## 2023-03-09 DIAGNOSIS — F439 Reaction to severe stress, unspecified: Secondary | ICD-10-CM | POA: Diagnosis not present

## 2023-03-09 NOTE — Progress Notes (Signed)
  Office: [phone]  /  Fax: [phone]    Date: March 09, 2023  Appointment Start Time: 9:31am Duration: 20 minutes Provider: Lawerance Cruel, Psy.D. Type of Session: Individual Therapy  Location of Patient: Home (private location) Location of Provider: Provider's Home (private office) Type of Contact: Telepsychological Visit via MyChart Video Visit  Session Content: Olivia Burns is a 72 y.o. female presenting for a follow-up appointment to address the previously established treatment goal of increasing coping skills.Today's appointment was a telepsychological visit. [name] provided verbal consent for today's telepsychological appointment and she is aware she is responsible for securing confidentiality on her end of the session. Prior to proceeding with today's appointment, [name]'s physical location at the time of this appointment was obtained as well a phone number she could be reached at in the event of technical difficulties. [name] and this provider participated in today's telepsychological service.   This provider conducted a brief check-in. [name] shared her dog is sick, noting she is improving. She discussed the aforementioned has impacted her sleep, eating habits, and overall well-being. Psychoeducation regarding the importance of self-care utilizing the oxygen mask metaphor was provided. Psychoeducation regarding pleasurable activities, including its impact on emotional eating and overall well-being was also provided. [name] was provided with a handout with various options of pleasurable activities, and was encouraged to engage in one activity a day and additional activities as needed when triggered to emotionally eat. [name] agreed. [name] provided verbal consent during today's appointment for this provider to send a handout with pleasurable activities via e-mail. Overall, [name] was receptive to today's appointment as evidenced by openness to sharing, responsiveness to feedback, and  willingness to engage in pleasurable activities to assist with coping.  Mental Status Examination:  Appearance: neat Behavior: appropriate to circumstances Mood: neutral Affect: mood congruent Speech: WNL Eye Contact: appropriate Psychomotor Activity: WNL Gait: unable to assess Thought Process: linear, logical, and goal directed and no evidence or endorsement of suicidal, homicidal, and self-harm ideation, plan and intent  Thought Content/Perception: no hallucinations, delusions, bizarre thinking or behavior endorsed or observed Orientation: AAOx4 Memory/Concentration: intact Insight: fair Judgment: fair  Interventions:  Conducted a brief chart review Provided empathic reflections and validation Employed supportive psychotherapy interventions to facilitate reduced distress and to improve coping skills with identified stressors Psychoeducation provided regarding pleasurable activities Psychoeducation provided regarding self-care  DSM-5 Diagnosis(es):  F50.89 Other Specified Feeding or Eating Disorder, Emotional Eating Behaviors and F43.9 Unspecified Trauma- and Stressor-Related Disorder  Treatment Goal & Progress: During the initial appointment with this provider, the following treatment goal was established: increase coping skills. [name] has demonstrated progress in her goal as evidenced by increased awareness of hunger patterns and increased awareness of triggers for emotional eating behaviors.   Plan: The next appointment is scheduled for 03/23/2023 at 10am, which will be via MyChart Video Visit. The next session will focus on working towards the established treatment goal. [name] reported she is waiting for a call back from [facility] to establish care.

## 2023-03-13 DIAGNOSIS — Z23 Encounter for immunization: Secondary | ICD-10-CM | POA: Diagnosis not present

## 2023-03-23 ENCOUNTER — Telehealth (INDEPENDENT_AMBULATORY_CARE_PROVIDER_SITE_OTHER): Payer: Medicare Other | Admitting: Psychology

## 2023-03-23 DIAGNOSIS — F5089 Other specified eating disorder: Secondary | ICD-10-CM

## 2023-03-23 DIAGNOSIS — F439 Reaction to severe stress, unspecified: Secondary | ICD-10-CM

## 2023-03-23 NOTE — Progress Notes (Signed)
  Office: [phone]  /  Fax: [phone]    Date: March 23, 2023  Appointment Start Time: 10:00am Duration: 19 minutes Provider: Lawerance Cruel, Psy.D. Type of Session: Individual Therapy  Location of Patient: Home (private location) Location of Provider: Provider's Home (private office) Type of Contact: Telepsychological Visit via MyChart Video Visit  Session Content: Olivia Burns is a 72 y.o. female presenting for a follow-up appointment to address the previously established treatment goal of increasing coping skills.Today's appointment was a telepsychological visit. Olivia Burns provided verbal consent for today's telepsychological appointment and she is aware she is responsible for securing confidentiality on her end of the session. Prior to proceeding with today's appointment, Olivia Burns physical location at the time of this appointment was obtained as well a phone number she could be reached at in the event of technical difficulties. Olivia Burns and this provider participated in today's telepsychological service.   This provider conducted a brief check-in. Olivia Burns shared her dog is "sick again." She also discussed decreased sleep due to the aforementioned. Reviewed pleasurable activities and self-care. Regarding eating habits, Olivia Burns stated, "It's been up and down all week. I haven't been sticking to the plan 100%." She acknowledged she is not consuming enough protein. Olivia Burns was engaged in problem solving to help increase protein intake and adherence to her prescribed structured meal plan. She agreed to implement the following: weigh protein; make hard boiled eggs in bulk; buy frozen meals that meet the criteria on the plan; purchase pre-portioned Skinny Pop vs large bag; and double recipes when possible. She was observed writing the aforementioned. Encouraged Olivia Burns to continue engaging in pleasurable activities. Overall, Olivia Burns was receptive to today's appointment as evidenced by openness to sharing,  responsiveness to feedback, and willingness to implement discussed strategies .  Mental Status Examination:  Appearance: neat Behavior: appropriate to circumstances Mood: neutral Affect: mood congruent Speech: WNL Eye Contact: appropriate Psychomotor Activity: WNL Gait: unable to assess Thought Process: linear, logical, and goal directed and no evidence or endorsement of suicidal, homicidal, and self-harm ideation, plan and intent  Thought Content/Perception: no hallucinations, delusions, bizarre thinking or behavior endorsed or observed Orientation: AAOx4 Memory/Concentration: intact Insight: fair Judgment: fair  Interventions:  Conducted a brief chart review Provided empathic reflections and validation Reviewed content from the previous session Employed supportive psychotherapy interventions to facilitate reduced distress and to improve coping skills with identified stressors Engaged patient in problem solving  DSM-5 Diagnosis(es):  F50.89 Other Specified Feeding or Eating Disorder, Emotional Eating Behaviors and F43.9 Unspecified Trauma- and Stressor-Related Disorder  Treatment Goal & Progress: During the initial appointment with this provider, the following treatment goal was established: increase coping skills. Olivia Burns has demonstrated progress in her goal as evidenced by increased awareness of hunger patterns and increased awareness of triggers for emotional eating behaviors.   Plan: The next appointment is scheduled for 04/13/2023 at 2pm, which will be via MyChart Video Visit. The next session will focus on working towards the established treatment goal. Olivia Burns's first appointment with [facility] is on April 07, 2023.

## 2023-03-31 ENCOUNTER — Ambulatory Visit (INDEPENDENT_AMBULATORY_CARE_PROVIDER_SITE_OTHER): Payer: Medicare Other | Admitting: Physician Assistant

## 2023-03-31 ENCOUNTER — Encounter (INDEPENDENT_AMBULATORY_CARE_PROVIDER_SITE_OTHER): Payer: Self-pay | Admitting: Physician Assistant

## 2023-03-31 VITALS — BP 109/71 | HR 68 | Temp 98.1°F | Ht 61.0 in | Wt 196.0 lb

## 2023-03-31 DIAGNOSIS — R7303 Prediabetes: Secondary | ICD-10-CM | POA: Diagnosis not present

## 2023-03-31 DIAGNOSIS — F3289 Other specified depressive episodes: Secondary | ICD-10-CM

## 2023-03-31 DIAGNOSIS — Z6837 Body mass index (BMI) 37.0-37.9, adult: Secondary | ICD-10-CM

## 2023-03-31 DIAGNOSIS — E669 Obesity, unspecified: Secondary | ICD-10-CM

## 2023-03-31 NOTE — Progress Notes (Signed)
.smr  Office: [phone]  /  Fax: [phone]  WEIGHT SUMMARY AND BIOMETRICS  Vitals Temp: 98.1 F (36.7 C) BP: 109/71 Pulse Rate: 68 SpO2: 99 %   Anthropometric Measurements Height: 5\' 1"  (1.549 m) Weight: 196 lb (88.9 kg) BMI (Calculated): 37.05 Weight at Last Visit: 201 lb Weight Lost Since Last Visit: 5 lb Weight Gained Since Last Visit: 0 Starting Weight: 223 lb Total Weight Loss (lbs): 27 lb (12.2 kg) Peak Weight: 238 lb Waist Measurement : 44 inches   Body Composition  Body Fat %: 45.6 % Fat Mass (lbs): 89.8 lbs Muscle Mass (lbs): 101.6 lbs Total Body Water (lbs): 66.8 lbs Visceral Fat Rating : 15   Other Clinical Data Fasting: yes Labs: no Today's Visit #: 19 Starting Date: 12/24/21     HPI  Chief Complaint: OBESITY  Olivia Burns is here to discuss her progress with her obesity treatment plan. She is on the the Category 2 Plan and states she is following her eating plan approximately 75 % of the time. She states she is exercising walking dog 15-20 minutes 7( twice daily) times per week.   Interval History:  Since last office visit she down 5 lbs.   The patient, with a history of obesity, presents for a follow-up visit.  Weight loss of five pounds since the last visit.  Despite some personal stressors, including a friend in rehab and a sick pet, she has been able to maintain some consistency in her eating habits.  She admits to occasionally skipping meals, often breakfast, and not always eating enough or the right foods.  We discussed the importance of regular meals and adequate protein intake for weight loss and overall health.  She notes fluctuations in her cravings, with some days being heavier than others.  Despite these challenges, she has managed to maintain her muscle mass and has seen a decrease in adipose tissue. She has also incorporated more fish into her diet.  She admits to occasional sugar cravings, which she satisfies with low-calorie  candy canes.   She is mindful of her eating habits and makes an effort to refocus and get back on track after indulging in a splurge.  She is also aware of the importance of meal planning and preparation, and she makes an effort to eat before grocery shopping to avoid making impulsive, unhealthy food choices. She is also on Wellbutrin, which she reports is helping to reduce emotional eating/cravings.  Pharmacotherapy: bupropion 200 mg daily for emotional eating/cravings. No side effects.   Seeing primary care provider-Dr. Carmelia Roller 04/13/23 for 51-month follow-up. Will plan to recheck labs over the next couple of months if not repeated by PCP.  TREATMENT PLAN FOR OBESITY: Total weight loss has been 27 pounds since 12/24/2021 TBW LOSS 12.1%  Obesity Patient has lost 5 lbs since last visit. Reports inconsistent eating habits, sometimes skipping meals and experiencing sugar cravings. Despite this, patient has maintained muscle mass and decreased adipose tissue. Patient is also incorporating more fish into her diet and working on meal planning and preparation strategies with Dr. Dewaine Conger via telemedicine. -Continue current obesity management strategies. -Encourage consistent, balanced meals and mindful eating. -Continue Wellbutrin as prescribed.  Recommended Dietary Goals  [name] is currently in the action stage of change. As such, her goal is to continue weight management plan. She has agreed to the Category 2 Plan.  Behavioral Intervention  We discussed the following Behavioral Modification Strategies today: continue to work on maintaining a reduced calorie state, getting the recommended  amount of protein, incorporating whole foods, making healthy choices, staying well hydrated and practicing mindfulness when eating..  Additional resources provided today: NA  Recommended Physical Activity Goals  Bhargavi has been advised to work up to 150 minutes of moderate intensity aerobic activity a week  and strengthening exercises 2-3 times per week for cardiovascular health, weight loss maintenance and preservation of muscle mass.   She has agreed to Continue current level of physical activity  and Increase physical activity in their day and reduce sedentary time (increase NEAT).   Pharmacotherapy We discussed various medication options to help [facility] with her weight loss efforts and we both agreed to continue Wellbutrin for emotional eating behaviors/cravings.    Return in about 4 weeks (around 04/28/2023).Marland Kitchen She was informed of the importance of frequent follow up visits to maximize her success with intensive lifestyle modifications for her multiple health conditions.  PHYSICAL EXAM:  Blood pressure 109/71, pulse 68, temperature 98.1 F (36.7 C), height 5\' 1"  (1.549 m), weight 196 lb (88.9 kg), SpO2 99%. Body mass index is 37.03 kg/m.  General: She is overweight, cooperative, alert, well developed, and in no acute distress. PSYCH: Has normal mood, affect and thought process.   Cardiovascular: HR 60s, BP 109/71 Lungs: Normal breathing effort, no conversational dyspnea. Neuro: No focal deficit  DIAGNOSTIC DATA REVIEWED:  BMET    Component Value Date/Time   NA 138 10/10/2022 0956   NA 141 12/24/2021 1412   K 4.4 10/10/2022 0956   CL 100 10/10/2022 0956   CO2 31 10/10/2022 0956   GLUCOSE 102 (H) 10/10/2022 0956   BUN 13 10/10/2022 0956   BUN 13 12/24/2021 1412   CREATININE 0.91 10/10/2022 0956   CALCIUM 9.4 10/10/2022 0956   Lab Results  Component Value Date   HGBA1C 6.3 10/10/2022   HGBA1C 6.1 05/07/2020   Lab Results  Component Value Date   INSULIN 10.8 12/24/2021   Lab Results  Component Value Date   TSH 2.06 07/09/2021   CBC    Component Value Date/Time   WBC 4.1 12/24/2021 1412   RBC 4.39 12/24/2021 1412   HGB 12.0 12/24/2021 1412   HCT 35.9 12/24/2021 1412   PLT 284 12/24/2021 1412   MCV 82 12/24/2021 1412   MCH 27.3 12/24/2021 1412   MCHC 33.4  12/24/2021 1412   RDW 13.3 12/24/2021 1412   Iron Studies    Component Value Date/Time   IRON 54 02/06/2022 1015   TIBC 310.8 02/06/2022 1015   FERRITIN 40.1 02/06/2022 1015   IRONPCTSAT 17.4 (L) 02/06/2022 1015   Lipid Panel     Component Value Date/Time   CHOL 178 11/21/2022 0914   TRIG 107.0 11/21/2022 0914   HDL 50.60 11/21/2022 0914   CHOLHDL 4 11/21/2022 0914   VLDL 21.4 11/21/2022 0914   LDLCALC 106 (H) 11/21/2022 0914   Hepatic Function Panel     Component Value Date/Time   PROT 7.1 11/21/2022 0914   PROT 7.0 12/24/2021 1412   ALBUMIN 4.1 11/21/2022 0914   ALBUMIN 4.3 12/24/2021 1412   AST 19 11/21/2022 0914   ALT 19 11/21/2022 0914   ALKPHOS 83 11/21/2022 0914   BILITOT 0.4 11/21/2022 0914   BILITOT <0.2 12/24/2021 1412   BILIDIR 0.1 11/21/2022 0914      Component Value Date/Time   TSH 2.06 07/09/2021 0859   Nutritional Lab Results  Component Value Date   VD25OH 70.78 10/07/2021   VD25OH 117.36 (HH) 07/09/2021    ASSOCIATED CONDITIONS ADDRESSED  TODAY  ASSESSMENT AND PLAN  Problem List Items Addressed This Visit     Prediabetes - Primary   Depression   Obesity with starting BMI of 42.1   BMI 37.0-37.9, adult   Prediabetes Last A1c was 6.3-not at goal/insulin no recent value Medication(s): None Polyphagia:No She is working on a  nutrition plan to decrease simple carbohydrates, increase lean proteins and exercise to promote weight loss, improve glycemic control and prevent progression to Type 2 diabetes.   Lab Results  Component Value Date   HGBA1C 6.3 10/10/2022   HGBA1C 6.1 (H) 12/24/2021   HGBA1C 6.2 07/09/2021   HGBA1C 6.1 05/07/2020   Lab Results  Component Value Date   INSULIN 10.8 12/24/2021    Plan: Continue working on nutrition plan to decrease simple carbohydrates, increase lean proteins and exercise to promote weight loss, improve glycemic control and prevent progression to Type 2 diabetes.  Will plan to recheck fasting  labs if needed over the next couple of months.  She does have a follow-up appointment with her primary care provider in early November.  Mood disorder/emotional eating Jeffery has had issues with stress/emotional eating. Currently this is moderately controlled. Overall mood is stable. Medication(s): Bupropion SR 200 mg daily in am no side effects with bupropion  Plan: Continue Bupropion SR 200 mg daily in am no refill needed this visit Continue to work on emotional eating behaviors strategies. She continues to work with Dr. Dewaine Conger eating strategies as well.  ATTESTASTION STATEMENTS:  Reviewed by clinician on day of visit: allergies, medications, problem list, medical history, surgical history, family history, social history, and previous encounter notes.   I have personally spent 40 minutes total time today in preparation, patient care, nutritional counseling and documentation for this visit, including the following: review of clinical lab tests; review of medical tests/procedures/services.      Betsey Sossamon, PA-C

## 2023-04-07 ENCOUNTER — Other Ambulatory Visit: Payer: Self-pay | Admitting: Medical Genetics

## 2023-04-07 DIAGNOSIS — F5081 Binge eating disorder, mild: Secondary | ICD-10-CM | POA: Diagnosis not present

## 2023-04-07 DIAGNOSIS — Z006 Encounter for examination for normal comparison and control in clinical research program: Secondary | ICD-10-CM

## 2023-04-10 ENCOUNTER — Encounter (HOSPITAL_COMMUNITY): Payer: Self-pay | Admitting: Gastroenterology

## 2023-04-13 ENCOUNTER — Encounter: Payer: Self-pay | Admitting: Family Medicine

## 2023-04-13 ENCOUNTER — Telehealth (INDEPENDENT_AMBULATORY_CARE_PROVIDER_SITE_OTHER): Payer: Medicare Other | Admitting: Psychology

## 2023-04-13 ENCOUNTER — Ambulatory Visit (INDEPENDENT_AMBULATORY_CARE_PROVIDER_SITE_OTHER): Payer: Medicare Other | Admitting: Family Medicine

## 2023-04-13 VITALS — BP 112/74 | HR 84 | Temp 97.8°F | Resp 16 | Ht 61.0 in | Wt 204.4 lb

## 2023-04-13 DIAGNOSIS — M25561 Pain in right knee: Secondary | ICD-10-CM

## 2023-04-13 DIAGNOSIS — I10 Essential (primary) hypertension: Secondary | ICD-10-CM | POA: Diagnosis not present

## 2023-04-13 DIAGNOSIS — R7303 Prediabetes: Secondary | ICD-10-CM | POA: Diagnosis not present

## 2023-04-13 DIAGNOSIS — G8929 Other chronic pain: Secondary | ICD-10-CM | POA: Insufficient documentation

## 2023-04-13 DIAGNOSIS — E785 Hyperlipidemia, unspecified: Secondary | ICD-10-CM | POA: Diagnosis not present

## 2023-04-13 DIAGNOSIS — R0789 Other chest pain: Secondary | ICD-10-CM | POA: Diagnosis not present

## 2023-04-13 DIAGNOSIS — F5089 Other specified eating disorder: Secondary | ICD-10-CM

## 2023-04-13 DIAGNOSIS — F439 Reaction to severe stress, unspecified: Secondary | ICD-10-CM

## 2023-04-13 LAB — COMPREHENSIVE METABOLIC PANEL
ALT: 16 U/L (ref 0–35)
AST: 17 U/L (ref 0–37)
Albumin: 4.2 g/dL (ref 3.5–5.2)
Alkaline Phosphatase: 81 U/L (ref 39–117)
BUN: 17 mg/dL (ref 6–23)
CO2: 31 meq/L (ref 19–32)
Calcium: 9.4 mg/dL (ref 8.4–10.5)
Chloride: 100 meq/L (ref 96–112)
Creatinine, Ser: 1 mg/dL (ref 0.40–1.20)
GFR: 56.09 mL/min — ABNORMAL LOW (ref >60.00)
Glucose, Bld: 90 mg/dL (ref 70–99)
Potassium: 4.2 meq/L (ref 3.5–5.1)
Sodium: 139 meq/L (ref 135–145)
Total Bilirubin: 0.4 mg/dL (ref 0.2–1.2)
Total Protein: 7 g/dL (ref 6.0–8.3)

## 2023-04-13 LAB — LIPID PANEL
Cholesterol: 206 mg/dL — ABNORMAL HIGH (ref 0–200)
HDL: 62.8 mg/dL (ref >39.00)
LDL Cholesterol: 128 mg/dL — ABNORMAL HIGH (ref 0–99)
NonHDL: 142.83
Total CHOL/HDL Ratio: 3
Triglycerides: 76 mg/dL (ref 0.0–149.0)
VLDL: 15.2 mg/dL (ref 0.0–40.0)

## 2023-04-13 LAB — HEMOGLOBIN A1C: Hgb A1c MFr Bld: 6.2 % (ref 4.6–6.5)

## 2023-04-13 MED ORDER — VALSARTAN-HYDROCHLOROTHIAZIDE 80-12.5 MG PO TABS: 1.0000 | ORAL_TABLET | Freq: Every day | ORAL | 3 refills | Status: DC

## 2023-04-13 NOTE — Progress Notes (Signed)
Chief Complaint  Patient presents with   Follow-up    Follow up    Subjective Olivia Burns is a 72 y.o. female who presents for hypertension follow up. She does not monitor home blood pressures. She is compliant with medication- Diovan HCT 80-12.5 mg/d. Patient has these side effects of medication: none She is usually adhering to a healthy diet overall. Current exercise: walking No current CP or SOB.   Hyperlipidemia Patient presents for dyslipidemia follow up. Currently being treated with Crestor 5 mg/d and compliance with treatment thus far has been good. She denies myalgias. Diet/exercise as above.  The patient is not known to have coexisting coronary artery disease.  Patient has a longstanding history of arthritis of both of her knees.  She had a replacement on the left side nearly 25 years ago.  No recent injury or change activity.  No swelling, redness, catching/locking, or bruising.  She has tried 650 mg of Tylenol temporary relief.  She has never had injections or physical therapy for this.   Past Medical History:  Diagnosis Date   Allergy    Anemia    Anxiety    Arthritis    Asthma    Constipation    Depression    GERD (gastroesophageal reflux disease)    Hyperlipidemia    Hypertension    Osteoarthritis    Pre-diabetes    Sleep apnea     Exam BP 112/74 (BP Location: Left Arm, Patient Position: Sitting, Cuff Size: Normal)   Pulse 84   Temp 97.8 F (36.6 C) (Oral)   Resp 16   Ht 5\' 1"  (1.549 m)   Wt 204 lb 6.4 oz (92.7 kg)   SpO2 96%   BMI 38.62 kg/m  General:  well developed, well nourished, in no apparent distress Heart: RRR, no bruits, no LE edema Lungs: clear to auscultation, no accessory muscle use Psych: well oriented with normal range of affect and appropriate judgment/insight  Essential hypertension  Hyperlipidemia, unspecified hyperlipidemia type - Plan: Lipid panel, Comprehensive metabolic panel  Prediabetes - Plan: Hemoglobin  A1c  Chest wall pain  Chronic pain of right knee  Chronic, stable. Cont Diovan HCT 80-12.5 mg/d. Counseled on diet and exercise. Chronic, hopefully stable. Cont Crestor 5 mg/d.  Ck A1c. Msk in etiology. Pec stretches/ provided.  Tylenol, stretches and exercises, heat, ice.  If no improvement, will consider steroid injection. F/u in 6 mo. The patient voiced understanding and agreement to the plan.  Jilda Roche [place], DO 04/13/23  10:13 AM

## 2023-04-13 NOTE — Progress Notes (Signed)
  Office: [phone]  /  Fax: [phone]    Date: April 13, 2023  Appointment Start Time: 2:03pm Duration: 19 minutes Provider: Lawerance Cruel, Psy.D. Type of Session: Individual Therapy  Location of Patient: Home (private location) Location of Provider: Provider's Home (private office) Type of Contact: Telepsychological Visit via MyChart Video Visit  Session Content: Olivia Burns is a 72 y.o. female presenting for a follow-up appointment to address the previously established treatment goal of increasing coping skills.Today's appointment was a telepsychological visit. Olivia Burns provided verbal consent for today's telepsychological appointment and she is aware she is responsible for securing confidentiality on her end of the session. Prior to proceeding with today's appointment, Aaron's physical location at the time of this appointment was obtained as well a phone number she could be reached at in the event of technical difficulties. Olivia Burns and this provider participated in today's telepsychological service.   This provider conducted a brief check-in. Olivia Burns shared feeling "tired" and "emotionally upset since the election." Associated thoughts and feelings briefly processed. Notably, she discussed starting to journal to help cope. She acknowledged the aforementioned has impacted her eating habits. Psychoeducation regarding thought defusion, including its impact on emotional eating and overall well-being was provided. Shade was led through a thought defusion exercise (Leaves on a Stream). Her experience was processed after the exercise. Olivia Burns provided verbal consent during today's appointment for this provider to send the handout for today's exercise via e-mail. Olivia Burns was receptive to today's appointment as evidenced by openness to sharing, responsiveness to feedback, and willingness to engage in thought defusion exercise to assist with coping.  Mental Status Examination:  Appearance:  neat Behavior: appropriate to circumstances Mood: neutral Affect: mood congruent Speech: WNL Eye Contact: appropriate Psychomotor Activity: WNL Gait: unable to assess Thought Process: linear, logical, and goal directed and no evidence or endorsement of suicidal, homicidal, and self-harm ideation, plan and intent  Thought Content/Perception: no hallucinations, delusions, bizarre thinking or behavior endorsed or observed Orientation: AAOx4 Memory/Concentration: intact Insight: fair Judgment: fair  Interventions:  Conducted a brief chart review Provided empathic reflections and validation Reviewed content from the previous session Employed supportive psychotherapy interventions to facilitate reduced distress and to improve coping skills with identified stressors Psychoeducation provided regarding thought defusion Engaged patient in thought defusion exercise(s)  DSM-5 Diagnosis(es):  F50.89 Other Specified Feeding or Eating Disorder, Emotional Eating Behaviors and F43.9 Unspecified Trauma- and Stressor-Related Disorder  Treatment Goal & Progress: During the initial appointment with this provider, the following treatment goal was established: increase coping skills. Olivia Burns has demonstrated progress in her goal as evidenced by increased awareness of hunger patterns and increased awareness of triggers for emotional eating behaviors. Olivia Burns also continues to demonstrate willingness to engage in learned skill(s).  Plan: The next appointment is scheduled for 05/11/2023 at 2:30pm, which will be via MyChart Video Visit. The next session will focus on working towards the established treatment goal. Olivia Burns will continue with her primary therapist.

## 2023-04-13 NOTE — Patient Instructions (Addendum)
Give [place] 2-3 business days to get the results of your labs back.   Keep the diet clean and stay active.  Heat (pad or rice pillow in microwave) over affected area, 10-15 minutes twice daily.   Ice/cold pack over area for 10-15 min twice daily.  OK to take Tylenol 1000 mg (2 extra strength tabs) or 975 mg (3 regular strength tabs) every 6 hours as needed.  Let us know if you need anything.  Pectoralis Major Rehab Ask your health care provider which exercises are safe for you. Do exercises exactly as told by your health care provider and adjust them as directed. It is normal to feel mild stretching, pulling, tightness, or discomfort as you do these exercises, but you should stop right away if you feel sudden pain or your pain gets worse. Do not begin these exercises until told by your health care provider. Stretching and range of motion exercises These exercises warm up your muscles and joints and improve the movement and flexibility of your shoulder. These exercises can also help to relieve pain, numbness, and tingling. Exercise A: Pendulum  Stand near a wall or a surface that you can hold onto for balance. Bend at the waist and let your left / right arm hang straight down. Use your other arm to keep your balance. Relax your arm and shoulder muscles, and move your hips and your trunk so your left / right arm swings freely. Your arm should swing because of the motion of your body, not because you are using your arm or shoulder muscles. Keep moving so your arm swings in the following directions, as told by your health care provider: Side to side. Forward and backward. In clockwise and counterclockwise circles. Slowly return to the starting position. Repeat 2 times. Complete this exercise 3 times per week. Exercise B: Abduction, standing Stand and hold a broomstick, a cane, or a similar object. Place your hands a little more than shoulder-width apart on the object. Your left / right hand  should be palm-up, and your other hand should be palm-down. While keeping your elbow straight and your shoulder muscles relaxed, push the stick across your body toward your left / right side. Raise your left / right arm to the side of your body and then over your head until you feel a stretch in your shoulder. Stop when you reach the angle that is recommended by your health care provider. Avoid shrugging your shoulder while you raise your arm. Keep your shoulder blade tucked down toward the middle of your spine. Hold for 10 seconds. Slowly return to the starting position. Repeat 2 times. Complete this exercise 3 times per week. Exercise C: Wand flexion, supine  Lie on your back. You may bend your knees for comfort. Hold a broomstick, a cane, or a similar object so that your hands are about shoulder-width apart on the object. Your palms should face toward your feet. Raise your left / right arm in front of your face, then behind your head (toward the floor). Use your other hand to help you do this. Stop when you feel a gentle stretch in your shoulder, or when you reach the angle that is recommended by your health care provider. Hold for 3 seconds. Use the broomstick and your other arm to help you return your left / right arm to the starting position. Repeat 2 times. Complete this exercise 3 times per week. Exercise D: Wand shoulder external rotation Stand and hold a broomstick, a cane,  or a similar object so your hands are about shoulder-width apart on the object. Start with your arms hanging down, then bend both elbows to an "L" shape (90 degrees). Keep your left / right elbow at your side. Use your other hand to push the stick so your left / right forearm moves away from your body, out to your side. Keep your left / right elbow bent to 90 degrees and keep it against your side. Stop when you feel a gentle stretch in your shoulder, or when you reach the angle recommended by your health care  provider. Hold for 10 seconds. Use the stick to help you return your left / right arm to the starting position. Repeat 2 times. Complete this exercise 3 times per week. Strengthening exercises These exercises build strength and endurance in your shoulder. Endurance is the ability to use your muscles for a long time, even after your muscles get tired. Exercise E: Scapular protraction, standing Stand so you are facing a wall. Place your feet about one arm-length away from the wall. Place your hands on the wall and straighten your elbows. Keep your hands on the wall as you push your upper back away from the wall. You should feel your shoulder blades sliding forward. Keep your elbows and your head still. If you are not sure that you are doing this exercise correctly, ask your health care provider for more instructions. Hold for 3 seconds. Slowly return to the starting position. Let your muscles relax completely before you repeat this exercise. Repeat 2 times. Complete this exercise 3 times per week. Exercise F: Shoulder blade squeezes  (scapular retraction) Sit with good posture in a stable chair. Do not let your back touch the back of the chair. Your arms should be at your sides with your elbows bent. You may rest your forearms on a pillow if that is more comfortable. Squeeze your shoulder blades together. Bring them down and back. Keep your shoulders level. Do not lift your shoulders up toward your ears. Hold for 3 seconds. Return to the starting position. Repeat 2 times. Complete this exercise 3 times per week. This information is not intended to replace advice given to you by your health care provider. Make sure you discuss any questions you have with your health care provider. Document Released: 05/19/2005 Document Revised: 02/28/2016 Document Reviewed: 02/04/2015 Elsevier Interactive Patient Education  2018 [employer]  Knee Exercises It is normal to feel mild stretching, pulling,  tightness, or discomfort as you do these exercises, but you should stop right away if you feel sudden pain or your pain gets worse.  STRETCHING AND RANGE OF MOTION EXERCISES  These exercises warm up your muscles and joints and improve the movement and flexibility of your knee. These exercises also help to relieve pain, numbness, and tingling. Exercise A: Knee Extension, Prone  Lie on your abdomen on a bed. Place your left / right knee just beyond the edge of the surface so your knee is not on the bed. You can put a towel under your left / right thigh just above your knee for comfort. Relax your leg muscles and allow gravity to straighten your knee. You should feel a stretch behind your left / right knee. Hold this position for 30 seconds. Scoot up so your knee is supported between repetitions. Repeat 2 times. Complete this stretch 3 times per week. Exercise B: Knee Flexion, Active     Lie on your back with both knees straight. If this  causes back discomfort, bend your left / right knee so your foot is flat on the floor. Slowly slide your left / right heel back toward your buttocks until you feel a gentle stretch in the front of your knee or thigh. Hold this position for 30 seconds. Slowly slide your left / right heel back to the starting position. Repeat 2 times. Complete this exercise 3 times per week. Exercise C: Quadriceps, Prone     Lie on your abdomen on a firm surface, such as a bed or padded floor. Bend your left / right knee and hold your ankle. If you cannot reach your ankle or pant leg, loop a belt around your foot and grab the belt instead. Gently pull your heel toward your buttocks. Your knee should not slide out to the side. You should feel a stretch in the front of your thigh and knee. Hold this position for 30 seconds. Repeat 2 times. Complete this stretch 3 times per week. Exercise D: Hamstring, Supine  Lie on your back. Loop a belt or towel over the ball of your left /  right foot. The ball of your foot is on the walking surface, right under your toes. Straighten your left / right knee and slowly pull on the belt to raise your leg until you feel a gentle stretch behind your knee. Do not let your left / right knee bend while you do this. Keep your other leg flat on the floor. Hold this position for 30 seconds. Repeat 2 times. Complete this stretch 3 times per week. STRENGTHENING EXERCISES  These exercises build strength and endurance in your knee. Endurance is the ability to use your muscles for a long time, even after they get tired. Exercise E: Quadriceps, Isometric     Lie on your back with your left / right leg extended and your other knee bent. Put a rolled towel or small pillow under your knee if told by your health care provider. Slowly tense the muscles in the front of your left / right thigh. You should see your kneecap slide up toward your hip or see increased dimpling just above the knee. This motion will push the back of the knee toward the floor. For 3 seconds, keep the muscle as tight as you can without increasing your pain. Relax the muscles slowly and completely. Repeat for 10 total reps Repeat 2 ti mes. Complete this exercise 3 times per week. Exercise F: Straight Leg Raises - Quadriceps  Lie on your back with your left / right leg extended and your other knee bent. Tense the muscles in the front of your left / right thigh. You should see your kneecap slide up or see increased dimpling just above the knee. Your thigh may even shake a bit. Keep these muscles tight as you raise your leg 4-6 inches (10-15 cm) off the floor. Do not let your knee bend. Hold this position for 3 seconds. Keep these muscles tense as you lower your leg. Relax your muscles slowly and completely after each repetition. 10 total reps. Repeat 2 times. Complete this exercise 3 times per week.  Exercise G: Hamstring Curls     If told by your health care provider, do this  exercise while wearing ankle weights. Begin with 5 lb weights (optional). Then increase the weight by 1 lb (0.5 kg) increments. Do not wear ankle weights that are more than 20 lbs to start with. Lie on your abdomen with your legs straight. Bend your left /  right knee as far as you can without feeling pain. Keep your hips flat against the floor. Hold this position for 3 seconds. Slowly lower your leg to the starting position. Repeat for 10 reps.  Repeat 2 times. Complete this exercise 3 times per week. Exercise H: Squats (Quadriceps)  Stand in front of a table, with your feet and knees pointing straight ahead. You may rest your hands on the table for balance but not for support. Slowly bend your knees and lower your hips like you are going to sit in a chair. Keep your weight over your heels, not over your toes. Keep your lower legs upright so they are parallel with the table legs. Do not let your hips go lower than your knees. Do not bend lower than told by your health care provider. If your knee pain increases, do not bend as low. Hold the squat position for 1 second. Slowly push with your legs to return to standing. Do not use your hands to pull yourself to standing. Repeat 2 times. Complete this exercise 3 times per week. Exercise I: Wall Slides (Quadriceps)     Lean your back against a smooth wall or door while you walk your feet out 18-24 inches (46-61 cm) from it. Place your feet hip-width apart. Slowly slide down the wall or door until your knees Repeat 2 times. Complete this exercise every other day. Exercise K: Straight Leg Raises - Hip Abductors  Lie on your side with your left / right leg in the top position. Lie so your head, shoulder, knee, and hip line up. You may bend your bottom knee to help you keep your balance. Roll your hips slightly forward so your hips are stacked directly over each other and your left / right knee is facing forward. Leading with your heel, lift your  top leg 4-6 inches (10-15 cm). You should feel the muscles in your outer hip lifting. Do not let your foot drift forward. Do not let your knee roll toward the ceiling. Hold this position for 3 seconds. Slowly return your leg to the starting position. Let your muscles relax completely after each repetition. 10 total reps. Repeat 2 times. Complete this exercise 3 times per week. Exercise J: Straight Leg Raises - Hip Extensors  Lie on your abdomen on a firm surface. You can put a pillow under your hips if that is more comfortable. Tense the muscles in your buttocks and lift your left / right leg about 4-6 inches (10-15 cm). Keep your knee straight as you lift your leg. Hold this position for 3 seconds. Slowly lower your leg to the starting position. Let your leg relax completely after each repetition. Repeat 2 times. Complete this exercise 3 times per week. Document Released: 04/02/2005 Document Revised: 02/11/2016 Document Reviewed: 03/25/2015 Elsevier Interactive Patient Education  2017 [employer].

## 2023-04-17 ENCOUNTER — Ambulatory Visit (HOSPITAL_COMMUNITY)
Admission: RE | Admit: 2023-04-17 | Discharge: 2023-04-17 | Disposition: A | Discharge status: Home / Self Care | Payer: Medicare Other | Attending: Gastroenterology | Admitting: Gastroenterology

## 2023-04-17 ENCOUNTER — Ambulatory Visit (HOSPITAL_BASED_OUTPATIENT_CLINIC_OR_DEPARTMENT_OTHER): Payer: Medicare Other | Admitting: Anesthesiology

## 2023-04-17 ENCOUNTER — Encounter (HOSPITAL_COMMUNITY)
Admission: RE | Disposition: A | Discharge status: Home / Self Care | Payer: Self-pay | Source: Home / Self Care | Attending: Gastroenterology

## 2023-04-17 ENCOUNTER — Ambulatory Visit (HOSPITAL_COMMUNITY): Payer: Medicare Other | Admitting: Anesthesiology

## 2023-04-17 ENCOUNTER — Other Ambulatory Visit: Payer: Self-pay

## 2023-04-17 DIAGNOSIS — D131 Benign neoplasm of stomach: Secondary | ICD-10-CM | POA: Diagnosis not present

## 2023-04-17 DIAGNOSIS — K219 Gastro-esophageal reflux disease without esophagitis: Secondary | ICD-10-CM | POA: Diagnosis not present

## 2023-04-17 DIAGNOSIS — Z79899 Other long term (current) drug therapy: Secondary | ICD-10-CM | POA: Diagnosis not present

## 2023-04-17 DIAGNOSIS — R1310 Dysphagia, unspecified: Secondary | ICD-10-CM | POA: Insufficient documentation

## 2023-04-17 DIAGNOSIS — K259 Gastric ulcer, unspecified as acute or chronic, without hemorrhage or perforation: Secondary | ICD-10-CM | POA: Diagnosis not present

## 2023-04-17 DIAGNOSIS — K222 Esophageal obstruction: Secondary | ICD-10-CM | POA: Insufficient documentation

## 2023-04-17 DIAGNOSIS — I10 Essential (primary) hypertension: Secondary | ICD-10-CM | POA: Diagnosis not present

## 2023-04-17 DIAGNOSIS — K317 Polyp of stomach and duodenum: Secondary | ICD-10-CM

## 2023-04-17 DIAGNOSIS — Z87891 Personal history of nicotine dependence: Secondary | ICD-10-CM | POA: Diagnosis not present

## 2023-04-17 DIAGNOSIS — D509 Iron deficiency anemia, unspecified: Secondary | ICD-10-CM | POA: Insufficient documentation

## 2023-04-17 HISTORY — PX: SAVORY DILATION: SHX5439

## 2023-04-17 HISTORY — PX: ESOPHAGOGASTRODUODENOSCOPY (EGD) WITH PROPOFOL: SHX5813

## 2023-04-17 HISTORY — PX: POLYPECTOMY: SHX5525

## 2023-04-17 SURGERY — ESOPHAGOGASTRODUODENOSCOPY (EGD) WITH PROPOFOL
Anesthesia: Monitor Anesthesia Care

## 2023-04-17 MED ORDER — SODIUM CHLORIDE 0.9 % IV SOLN: INTRAVENOUS | Status: DC

## 2023-04-17 MED ORDER — PROPOFOL 10 MG/ML IV BOLUS
INTRAVENOUS | Status: DC | PRN
  Administered 2023-04-17: 70 mg via INTRAVENOUS

## 2023-04-17 MED ORDER — PROPOFOL 500 MG/50ML IV EMUL
INTRAVENOUS | Status: DC | PRN
  Administered 2023-04-17: 125 ug/kg/min via INTRAVENOUS

## 2023-04-17 MED ORDER — DEXMEDETOMIDINE HCL IN NACL 80 MCG/20ML IV SOLN
INTRAVENOUS | Status: DC | PRN
  Administered 2023-04-17: 8 ug via INTRAVENOUS

## 2023-04-17 SURGICAL SUPPLY — 15 items
BLOCK BITE 60FR ADLT L/F BLUE (MISCELLANEOUS) ×2 IMPLANT
ELECT REM PT RETURN 9FT ADLT (ELECTROSURGICAL)
ELECTRODE REM PT RTRN 9FT ADLT (ELECTROSURGICAL) IMPLANT
FORCEP RJ3 GP 1.8X160 W-NEEDLE (CUTTING FORCEPS) IMPLANT
FORCEPS BIOP RAD 4 LRG CAP 4 (CUTTING FORCEPS) IMPLANT
NDL SCLEROTHERAPY 25GX240 (NEEDLE) IMPLANT
NEEDLE SCLEROTHERAPY 25GX240 (NEEDLE)
PROBE APC STR FIRE (PROBE) IMPLANT
PROBE INJECTION GOLD (MISCELLANEOUS)
PROBE INJECTION GOLD 7FR (MISCELLANEOUS) IMPLANT
SNARE SHORT THROW 13M SML OVAL (MISCELLANEOUS) IMPLANT
SYR 50ML LL SCALE MARK (SYRINGE) IMPLANT
TUBING ENDO SMARTCAP PENTAX (MISCELLANEOUS) ×4 IMPLANT
TUBING IRRIGATION ENDOGATOR (MISCELLANEOUS) ×3 IMPLANT
WATER STERILE IRR 1000ML POUR (IV SOLUTION) IMPLANT

## 2023-04-17 NOTE — H&P (Signed)
Olivia Olivia Burns HPI: This 72 year old black female presents to the office for a follow-up after procedure. She had an EGD done 12/12/2022 when a prominent fold prominent fold below GEJ-I attempte t biopsy it but as the patient desaturated the biopsies could not be done. She had a colonoscopy done the same day when a tubular adenoma was removed. She takes Omeprazole 40 mg for acid reflux with good control. She has episodes of difficulty swallowing solids and pills. She takes Linzess 145 mcg daily for constipation. She occasionally takes 2 stool softeners as needed. She has 1 BM per day with no obvious blood or mucus in the stool. She has iron deficiency anemia and is taking Ferrous Gluconate once a day. Her last CBC done on 12/24/2021 revealed a hemoglobin of 12.0 gm/dl and an Iron panel done on 12/24/2021 was all within normal ranges. She has good appetite and her weight has been stable. She denies having any complaints of abdominal pain, nausea, vomiting, or odynophagia. She denies having a family history of colon cancer, celiac sprue or IBD.  Past Medical History:  Diagnosis Date   Allergy    Anemia    Anxiety    Arthritis    Asthma    Constipation    Depression    GERD (gastroesophageal reflux disease)    Hyperlipidemia    Hypertension    Osteoarthritis    Pre-diabetes    Sleep apnea     Past Surgical History:  Procedure Laterality Date   ABDOMINAL HYSTERECTOMY  2001   EYE SURGERY     HEMORRHOID SURGERY  1975   MENISCUS REPAIR Olivia Burns 1999   miscarriage  89 and 78    Family History  Problem Relation Age of Onset   Arthritis Mother    Asthma Mother    Depression Mother    Diabetes Mother    Heart disease Mother    Kidney disease Mother    Arthritis Father    Depression Father    Hyperlipidemia Father    Hypertension Father    Cancer Father        prostate   Cancer Sister        gastric vs pancreatic   Drug abuse Brother    Early death Brother     Social History:   reports that she has quit smoking. Her smoking use included cigarettes. She has never used smokeless tobacco. She reports current alcohol use. She reports that she does not use drugs.  Allergies:  Allergies  Allergen Reactions   Codeine Other (See Comments)    Problems breathing   Naproxen Other (See Comments)   Penicillins Itching   Latex Rash    Medications: Scheduled: Continuous:  sodium chloride      No results found for this or any previous visit (from the past 24 hour(s)).   No results found.  ROS:  As stated above in the HPI otherwise negative.  Blood pressure (!) 143/71, pulse 71, temperature (!) 97.3 F (36.3 C), temperature source Temporal, resp. rate 20, height 5\' 1"  (1.549 m), weight 92.5 kg, SpO2 100%.    PE: Gen: NAD, Alert and Oriented HEENT:  Talent/AT, EOMI Neck: Supple, no LAD Lungs: CTA Bilaterally CV: RRR without M/G/R ABD: Soft, NTND, +BS Ext: No C/C/E  Assessment/Plan: 1) Dysphagia - EGD with possible dilation.  Olivia Olivia Burns D 04/17/2023, 10:49 AM

## 2023-04-17 NOTE — Transfer of Care (Signed)
Immediate Anesthesia Transfer of Care Note  Patient: Olivia Burns  Procedure(s) Performed: ESOPHAGOGASTRODUODENOSCOPY (EGD) WITH PROPOFOL POLYPECTOMY SAVORY DILATION  Patient Location: PACU  Anesthesia Type:General  Level of Consciousness: awake, alert , and oriented  Airway & Oxygen Therapy: Patient Spontanous Breathing  Post-op Assessment: Report given to RN and Post -op Vital signs reviewed and stable  Post vital signs: Reviewed and stable  Last Vitals:  Vitals Value Taken Time  BP 150/52 04/17/23 1130  Temp    Pulse 67 04/17/23 1131  Resp 19 04/17/23 1131  SpO2 100 % 04/17/23 1131  Vitals shown include unfiled device data.  Last Pain:  Vitals:   04/17/23 0950  TempSrc: Temporal  PainSc: 0-No pain         Complications: No notable events documented.

## 2023-04-17 NOTE — Anesthesia Preprocedure Evaluation (Signed)
Anesthesia Evaluation  Patient identified by MRN, date of birth, ID band Patient awake    Reviewed: Allergy & Precautions, H&P , NPO status , Patient's Chart, lab work & pertinent test results  Airway Mallampati: II  TM Distance: >3 FB Neck ROM: Full    Dental no notable dental hx.    Pulmonary asthma , sleep apnea , former smoker   Pulmonary exam normal breath sounds clear to auscultation       Cardiovascular hypertension, Pt. on medications negative cardio ROS Normal cardiovascular exam Rhythm:Regular Rate:Normal     Neuro/Psych   Anxiety Depression    negative neurological ROS  negative psych ROS   GI/Hepatic Neg liver ROS,GERD  ,,  Endo/Other  negative endocrine ROS    Renal/GU negative Renal ROS  negative genitourinary   Musculoskeletal  (+) Arthritis , Osteoarthritis,    Abdominal  (+) + obese  Peds negative pediatric ROS (+)  Hematology negative hematology ROS (+)   Anesthesia Other Findings   Reproductive/Obstetrics negative OB ROS                             Anesthesia Physical Anesthesia Plan  ASA: 2  Anesthesia Plan: MAC   Post-op Pain Management: Minimal or no pain anticipated   Induction: Intravenous  PONV Risk Score and Plan: 3 and Propofol infusion and Treatment may vary due to age or medical condition  Airway Management Planned: Nasal Cannula  Additional Equipment:   Intra-op Plan:   Post-operative Plan:   Informed Consent: I have reviewed the patients History and Physical, chart, labs and discussed the procedure including the risks, benefits and alternatives for the proposed anesthesia with the patient or authorized representative who has indicated his/her understanding and acceptance.     Dental advisory given  Plan Discussed with: CRNA  Anesthesia Plan Comments:        Anesthesia Quick Evaluation

## 2023-04-17 NOTE — Op Note (Signed)
[facility] Patient Name: Olivia Burns Procedure Date: 04/17/2023 MRN: [id number] Attending MD: Jeani Hawking , MD, [id number] Date of Birth: [birth year] CSN: [id number] Age: 72 Admit Type: Outpatient Procedure:                Upper GI endoscopy Indications:              Dysphagia Providers:                Jeani Hawking, MD, Fransisca Connors, Geoffery Lyons,                            Technician Referring MD:              Medicines:                Propofol per Anesthesia Complications:            No immediate complications. Estimated Blood Loss:     Estimated blood loss: none. Procedure:                Pre-Anesthesia Assessment:                           - Prior to the procedure, a History and Physical                            was performed, and patient medications and                            allergies were reviewed. The patient's tolerance of                            previous anesthesia was also reviewed. The risks                            and benefits of the procedure and the sedation                            options and risks were discussed with the patient.                            All questions were answered, and informed consent                            was obtained. Prior Anticoagulants: The patient has                            taken no anticoagulant or antiplatelet agents. ASA                            Grade Assessment: III - A patient with severe                            systemic disease. After reviewing the risks and                            benefits, the  patient was deemed in satisfactory                            condition to undergo the procedure.                           - Sedation was administered by an anesthesia                            professional. Deep sedation was attained.                           After obtaining informed consent, the endoscope was                            passed under direct vision. Throughout the                             procedure, the patient's blood pressure, pulse, and                            oxygen saturations were monitored continuously. The                            [id number]-[id number] ([id number]) Olympus endoscope was introduced                            through the mouth, and advanced to the second part                            of duodenum. The upper GI endoscopy was                            accomplished without difficulty. The patient                            tolerated the procedure well. Scope In: Scope Out: Findings:      One benign-appearing, intrinsic mild stenosis was found at the       cricopharyngeus. This stenosis measured 1.6 cm (inner diameter) x 1 cm       (in length). The stenosis was traversed. A guidewire was placed and the       scope was withdrawn. Dilation was performed with a Savary dilator with       no resistance at 18 mm. The dilation site was examined following       endoscope reinsertion and showed complete resolution of luminal       narrowing. Estimated blood loss was minimal.      Three 5 mm sessile polyps with bleeding and stigmata of recent bleeding       were found in the cardia. The polyp was removed with a hot snare.       Resection and retrieval were complete.      The examined duodenum was normal. Impression:               - Benign-appearing esophageal stenosis. Dilated.                           -  Three gastric polyps. Resected and retrieved.                           - Normal examined duodenum. Moderate Sedation:      Not Applicable - Patient had care per Anesthesia. Recommendation:           - Patient has a contact number available for                            emergencies. The signs and symptoms of potential                            delayed complications were discussed with the                            patient. Return to normal activities tomorrow.                            Written discharge instructions were provided to the                             patient.                           - Resume previous diet.                           - Continue present medications.                           - Await pathology results.                           - Follow up in 2 months with Dr. Loreta Ave. Procedure Code(s):        --- Professional ---                           [phone], Esophagogastroduodenoscopy, flexible,                            transoral; with removal of tumor(s), polyp(s), or                            other lesion(s) by snare technique                           [id number], Esophagogastroduodenoscopy, flexible,                            transoral; with insertion of guide wire followed by                            passage of dilator(s) through esophagus over guide                            wire Diagnosis Code(s):        ---  Professional ---                           K31.7, Polyp of stomach and duodenum                           K22.2, Esophageal obstruction                           R13.10, Dysphagia, unspecified CPT copyright 2022 American Medical Association. All rights reserved. The codes documented in this report are preliminary and upon coder review may  be revised to meet current compliance requirements. Jeani Hawking, MD Jeani Hawking, MD 04/17/2023 11:33:47 AM This report has been signed electronically. Number of Addenda: 0

## 2023-04-17 NOTE — Anesthesia Postprocedure Evaluation (Signed)
Anesthesia Post Note  Patient: Olivia Burns  Procedure(s) Performed: ESOPHAGOGASTRODUODENOSCOPY (EGD) WITH PROPOFOL POLYPECTOMY SAVORY DILATION     Patient location during evaluation: PACU Anesthesia Type: MAC Level of consciousness: awake and alert Pain management: pain level controlled Vital Signs Assessment: post-procedure vital signs reviewed and stable Respiratory status: spontaneous breathing, nonlabored ventilation and respiratory function stable Cardiovascular status: blood pressure returned to baseline and stable Postop Assessment: no apparent nausea or vomiting Anesthetic complications: no   No notable events documented.  Last Vitals:  Vitals:   04/17/23 1150 04/17/23 1154  BP: (!) 146/60 (!) 155/72  Pulse: 71 67  Resp: 16 14  Temp:    SpO2: 98% 99%    Last Pain:  Vitals:   04/17/23 1154  TempSrc:   PainSc: 0-No pain                 Lowella Curb

## 2023-04-17 NOTE — Discharge Instructions (Signed)

## 2023-04-20 ENCOUNTER — Encounter (HOSPITAL_COMMUNITY): Payer: Self-pay | Admitting: Gastroenterology

## 2023-04-20 LAB — SURGICAL PATHOLOGY

## 2023-04-28 ENCOUNTER — Encounter (INDEPENDENT_AMBULATORY_CARE_PROVIDER_SITE_OTHER): Payer: Self-pay | Admitting: Physician Assistant

## 2023-04-28 ENCOUNTER — Ambulatory Visit (INDEPENDENT_AMBULATORY_CARE_PROVIDER_SITE_OTHER): Payer: Medicare Other | Admitting: Physician Assistant

## 2023-04-28 VITALS — BP 101/66 | HR 70 | Temp 98.2°F | Ht 61.0 in | Wt 199.0 lb

## 2023-04-28 DIAGNOSIS — K222 Esophageal obstruction: Secondary | ICD-10-CM | POA: Diagnosis not present

## 2023-04-28 DIAGNOSIS — Z6837 Body mass index (BMI) 37.0-37.9, adult: Secondary | ICD-10-CM | POA: Diagnosis not present

## 2023-04-28 DIAGNOSIS — F3289 Other specified depressive episodes: Secondary | ICD-10-CM

## 2023-04-28 DIAGNOSIS — R7303 Prediabetes: Secondary | ICD-10-CM

## 2023-04-28 DIAGNOSIS — E669 Obesity, unspecified: Secondary | ICD-10-CM

## 2023-04-28 NOTE — Progress Notes (Signed)
SUBJECTIVE:  Chief Complaint: Obesity  Interim History: Olivia Burns, a 72 year old patient with a history of prediabetes, hypertension, and emotional eating behaviors, was seen for a follow-up of her obesity treatment plan.  She is up 3 lbs since her last visit.  Muscle mass + 1.2 lbs. Adipose mass + 2 lbs.  Total body water + 1.2 lbs.   She recently underwent an upper GI with Dr. Elnoria Howard for dysphagia, which revealed benign appearing esophageal stenosis and three gastric polyps. The polyps were resected and pathology showed them to be hyperplastic with focal erosion, but negative for malignancy or dysplasia.  Post-procedure, the patient reported a week of soreness and discomfort, particularly when swallowing. However, she has since recovered and resumed her regular diet/back on track with her nutrition plan.   She also mentioned a friend who is currently in a nursing facility and is showing signs of improvement, although she still has difficulty swallowing food.  The patient recently celebrated a birthday, but did not engage in any special activities due to the procedure and inclement weather. Her pet dog, TigerLilly  is doing well and enjoys visiting the neighbors.  The patient is currently on bupropion, which was recently increased. She reported missing a couple of doses due to the procedure and a busy schedule. She feels the medication is somewhat effective, but not significantly so. She has two refills left and does not need any more at this time.  The patient is considering attending a family Thanksgiving gathering, but is concerned about the weather and driving conditions. She was given advice on portion control for the holiday meal, which she found helpful and plans to implement.  [name] is here to discuss her progress with her obesity treatment plan. She is on the Category 2 Plan and states she is following her eating plan approximately 70 % of the time. She states she is exercising  walking her dog [name] 15 minutes 7 times per week.   OBJECTIVE: Visit Diagnoses: Problem List Items Addressed This Visit     Prediabetes - Primary   Depression   Obesity with starting BMI of 42.1   BMI 37.0-37.9, adult  Obesity Down 24 lbs total  TBW loss of 79.73% 72 year old with obesity, prediabetes, hypertension, and emotional eating. Discussed portion control, protein intake, and avoiding high-fat, high-carbohydrate foods, especially during Thanksgiving. Encouraged walking within 30 minutes post-meals to manage glucose levels. - Continue current obesity treatment plan - Encourage portion control during meals - Encourage walking within 30 minutes post-meals   Prediabetes Last A1c was 6.2- stable, but not at goal  Medication(s): None Polyphagia:No She is working on a nutrition plan to decrease simple carbohydrates, increase lean proteins and exercise to promote weight loss, improve glycemic control and prevent progression to Type 2 diabetes.   Lab Results  Component Value Date   HGBA1C 6.2 04/13/2023   HGBA1C 6.3 10/10/2022   HGBA1C 6.1 (H) 12/24/2021   HGBA1C 6.2 07/09/2021   HGBA1C 6.1 05/07/2020   Lab Results  Component Value Date   INSULIN 10.8 12/24/2021    Plan:  Continue working on nutrition plan to decrease simple carbohydrates, increase lean proteins and exercise to promote weight loss, improve glycemic control and prevent progression to Type 2 diabetes.    Emotional Eating On bupropion for emotional eating. Missed doses due to a recent procedure and being busy. Reports slight improvement with medication. Emphasized consistent medication adherence. - Continue bupropion Sr 200 mg daily. Reports no refills needed this visit.  Continue to work on emotional eating strategies.   Benign Esophageal Stenosis Post-esophageal dilatation for dysphagia with significant symptom improvement. Experienced a week of soreness. Three resected gastric polyps were hyperplastic  with focal erosion, negative for malignancy or dysplasia. - Monitor for recurrence of dysphagia or other symptoms - Follow up with Dr. Elnoria Howard as needed/directed  General Health Maintenance Discussed portion control, balanced diet, and regular physical activity. - Encourage portion control and balanced diet - Encourage regular physical activity  Follow-up - Follow-up appointment on May 25, 2023 at 10 AM.  Vitals Temp: 98.2 F (36.8 C) BP: 101/66 Pulse Rate: 70 SpO2: 100 %   Anthropometric Measurements Height: 5\' 1"  (1.549 m) Weight: 199 lb (90.3 kg) BMI (Calculated): 37.62 Weight at Last Visit: 196 lb Weight Lost Since Last Visit: 0 Weight Gained Since Last Visit: 3 lb Starting Weight: 223 lb Total Weight Loss (lbs): 24 lb (10.9 kg) Peak Weight: 238 lb   Body Composition  Body Fat %: 46 % Fat Mass (lbs): 91.8 lbs Muscle Mass (lbs): 102.4 lbs Total Body Water (lbs): 68 lbs Visceral Fat Rating : 15   Other Clinical Data Fasting: yes Labs: no Today's Visit #: 20 Starting Date: 12/24/21     ASSESSMENT AND PLAN:  Diet: Ambriel is currently in the action stage of change. As such, her goal is to continue with weight loss efforts. She has agreed to Category 2 Plan.  Exercise: Liliahna has been instructed to try a geriatric exercise plan and to continue exercising as is for weight loss and overall health benefits.   Behavior Modification:  We discussed the following Behavioral Modification Strategies today: increasing lean protein intake, decreasing simple carbohydrates, increasing vegetables, increase H2O intake, increase high fiber foods, meal planning and cooking strategies, holiday eating strategies, and planning for success. We discussed various medication options to help [facility] with her weight loss efforts and we both agreed to continue Wellbutrin SR 200 mg daily.  Return in about 4 weeks (around 05/26/2023).Marland Kitchen She was informed of the importance of frequent  follow up visits to maximize her success with intensive lifestyle modifications for her multiple health conditions.  Attestation Statements:   Reviewed by clinician on day of visit: allergies, medications, problem list, medical history, surgical history, family history, social history, and previous encounter notes.   Time spent on visit including pre-visit chart review and post-visit care and charting was 34 minutes.    Virgil Slinger, PA-C

## 2023-05-04 DIAGNOSIS — F5081 Binge eating disorder, mild: Secondary | ICD-10-CM | POA: Diagnosis not present

## 2023-05-06 ENCOUNTER — Other Ambulatory Visit (HOSPITAL_COMMUNITY): Payer: Medicare Other | Attending: Medical Genetics

## 2023-05-11 ENCOUNTER — Telehealth (INDEPENDENT_AMBULATORY_CARE_PROVIDER_SITE_OTHER): Payer: Medicare Other | Admitting: Psychology

## 2023-05-11 DIAGNOSIS — F439 Reaction to severe stress, unspecified: Secondary | ICD-10-CM | POA: Diagnosis not present

## 2023-05-11 DIAGNOSIS — F5089 Other specified eating disorder: Secondary | ICD-10-CM

## 2023-05-11 NOTE — Progress Notes (Signed)
  Office: [phone]  /  Fax: [phone]    Date: May 11, 2023  Appointment Start Time: 2:32pm Duration: 19 minutes Provider: Lawerance Cruel, Psy.D. Type of Session: Individual Therapy  Location of Patient: Home (private location) Location of Provider: Provider's Home (private office) Type of Contact: Telepsychological Visit via MyChart Video Visit  Session Content: Olivia Burns is a 72 y.o. female presenting for a follow-up appointment to address the previously established treatment goal of increasing coping skills.Today's appointment was a telepsychological visit. [name] provided verbal consent for today's telepsychological appointment and she is aware she is responsible for securing confidentiality on her end of the session. Prior to proceeding with today's appointment, [name]'s physical location at the time of this appointment was obtained as well a phone number she could be reached at in the event of technical difficulties. [name] and this provider participated in today's telepsychological service.   This provider conducted a brief check-in. [name] shared she is tired. Discussed what went well during Thanksgiving as it relates to eating and what she can do differently during future holidays. Psychoeducation provided regarding all or nothing thinking. Psychoeducation regarding SMART goals was also provided and [name] was engaged in goal setting to help increase adherence to her prescribed structured meal plan. The following goal was established: [name] will eat dinner congruent to her prescribed structured meal plan at least 3 out of 7 days a week between now and the next appointment with this provider. Overall, [name] was receptive to today's appointment as evidenced by openness to sharing, responsiveness to feedback, and willingness to work toward the established SMART goal.  Mental Status Examination:  Appearance: neat Behavior: appropriate to circumstances Mood: neutral Affect: mood  congruent Speech: WNL Eye Contact: appropriate Psychomotor Activity: WNL Gait: unable to assess Thought Process: linear, logical, and goal directed and no evidence or endorsement of suicidal, homicidal, and self-harm ideation, plan and intent  Thought Content/Perception: no hallucinations, delusions, bizarre thinking or behavior endorsed or observed Orientation: AAOx4 Memory/Concentration: intact Insight: fair Judgment: fair  Interventions:  Conducted a brief chart review Provided empathic reflections and validation Provided positive reinforcement Employed supportive psychotherapy interventions to facilitate reduced distress and to improve coping skills with identified stressors Engaged patient in goal setting Psychoeducation provided regarding SMART goals  DSM-5 Diagnosis(es):  F50.89 Other Specified Feeding or Eating Disorder, Emotional Eating Behaviors and F43.9 Unspecified Trauma- and Stressor-Related Disorder  Treatment Goal & Progress: During the initial appointment with this provider, the following treatment goal was established: increase coping skills. [name] has demonstrated progress in her goal as evidenced by increased awareness of hunger patterns and increased awareness of triggers for emotional eating behaviors. [name] also continues to demonstrate willingness to engage in learned skill(s).  Plan: The next appointment is scheduled for 06/01/2023 at 2:30pm, which will be via MyChart Video Visit. The next session will focus on working towards the established treatment goal. [name] will continue with her primary therapist.

## 2023-05-18 DIAGNOSIS — F5081 Binge eating disorder, mild: Secondary | ICD-10-CM | POA: Diagnosis not present

## 2023-05-24 NOTE — Progress Notes (Unsigned)
SUBJECTIVE: Discussed the use of AI scribe software for clinical note transcription with the patient, who gave verbal consent to proceed.  History of Present Illness     Chief Complaint: Obesity  Interim History: She maintained her weight since her last visit.  Down 24 lbs overall TBW loss of 10.8% Olivia Burns, a 72 year old individual with a history of prediabetes, hypertension, hypercholesterolemia, and obesity, presents for a follow-up visit regarding her obesity treatment plan. She is currently on Wellbutrin SR 200mg  daily, Valsartan/Hydrochlorothiazide 80/12.5mg  daily for blood pressure control, and Crestor 5mg  daily for hypercholesterolemia.  The patient reports adherence to her medication regimen and has been actively participating in social events, including holiday gatherings and a cookie swap at her local clubhouse. Despite the temptations of these events, she has managed to maintain her diet and has not significantly deviated from her obesity treatment plan.  However, the patient has been experiencing severe right knee pain for the past two weeks, which has been an intermittent issue for several years. She has a history of right knee osteoarthritis, and x rays from 2021 did show severe tricompartmental degenerative changes. The pain has become so severe that it has started to affect her ability to walk normally. She has been managing the pain with arthritis Tylenol as needed but has not been taking it regularly. She is unable to use voltaren gel .  She has seen Dr. Luiz Blare but does not think she has seen him since 1999. She has not seen an orthopedic doctor since then. The patient expresses a desire to consult with an orthopedic specialist due to the worsening knee pain.  The patient also reports that she has been struggling with hydration, only managing to consume two to three servings of water daily. She has not reported any new medications or changes in her current medication  regimen. Despite the knee pain, she has continued to walk her dog, Olivia Burns, daily.  In terms of her obesity treatment, the patient has lost 24 pounds so far. However, the knee pain has been a significant obstacle to her ability to exercise and may potentially hinder further weight loss. She has been considering chair exercises and chair yoga as potential alternatives. We looked at Chair Yoga and Bedtime Yoga videos with Adriene on You tube for examples.   The patient's cholesterol management may need to be reassessed in the future, as her LDL levels were slightly elevated at her last check. She is currently on Crestor 5mg  daily, which she tolerates well at this dose. Higher doses have previously caused muscle aches and cramps. The patient is aware of the need for a more aggressive approach to cholesterol management and is open to considering other medications.  Olivia Burns is here to discuss her progress with her obesity treatment plan. She is on the Category 2 Plan and states she is following her eating plan approximately 70 % of the time. She states she is exercising walking her dog Olivia Burns 15 minutes 7 times per week.   OBJECTIVE: Visit Diagnoses: Problem List Items Addressed This Visit     Essential hypertension   Hyperlipidemia   Depression   Obesity with starting BMI of 42.1   BMI 37.0-37.9, adult   Primary osteoarthritis of right knee - Primary  Severe Tricompartmental Right Knee Osteoarthritis Severe tricompartmental right knee osteoarthritis confirmed by x-ray in 2021. Symptoms have worsened, causing significant pain and mobility issues. Previous meniscus tear repaired in 1999. Current management includes intermittent use of arthritis Tylenol. Discussed potential benefits  and risks of steroid or gel injections, and knee replacement surgery. Weight loss helps but does not reverse existing damage. Emphasized timely intervention due to age-related recovery challenges. - Refer to Dr. Luiz Blare at  [facility] for evaluation and potential steroid or gel injections - Consider knee replacement surgery if recommended by orthopedic specialist - Recommend use of a cane to help unload pressure on the knee - Encourage use of a knee brace for stability  Obesity Follow-up for obesity treatment plan. Lost 24 pounds but experienced challenges during the holiday season. Emotional eating behaviors noted. Discussed importance of hydration and fluid intake. Kidney function slightly decreased, requiring monitoring. - Continue current weight management strategies - Encourage hydration and increase fluid intake to at least four glasses per day - Recommend chair yoga and bedtime yoga with Hansel Starling for core strengthening and relaxation - Recheck kidney function early next year  Hypertension Hypertension, currently managed with valsartan/hydrochlorothiazide 80/12.5 mg once daily. - Continue current medication regimen  Hypercholesterolemia Hypercholesterolemia, currently managed with Crestor 5 mg daily. LDL levels remain slightly elevated. Previous higher doses of Crestor caused muscle aches and cramps. Discussed potential new medications like Wegovy or Repatha if LDL remains elevated. Emphasized importance of avoiding fatty foods. - Continue current dose of Crestor - Recheck cholesterol levels early next year - Discuss potential new medications like Wegovy or Repatha if LDL remains elevated  Mood disorder/emotional eating Olivia Burns has had issues with stress/emotional eating. Currently this is moderately controlled. Overall mood is stable. Medication(s): Bupropion SR 200 mg daily in am Reports no side effects with Wellbutrin.   Plan: Continue Bupropion SR 200 mg daily in am Continue to work with Dr. Dewaine Conger for emotional eating strategies.   Follow-up - Follow-up with Dr. Dewaine Conger on June 01, 2023 - Schedule follow-up appointment for June 22, 2023 at 10 AM.  Vitals Temp: 98 F (36.7  C) BP: 120/75 Pulse Rate: 76 SpO2: 99 %   Anthropometric Measurements Height: 5\' 1"  (1.549 m) Weight: 199 lb (90.3 kg) BMI (Calculated): 37.62 Weight at Last Visit: 199lb Weight Lost Since Last Visit: 0lb Weight Gained Since Last Visit: 0lb Starting Weight: 223 lb Total Weight Loss (lbs): 24 lb (10.9 kg) Peak Weight: 238 lb   Body Composition  Body Fat %: 46.4 % Fat Mass (lbs): 92.6 lbs Muscle Mass (lbs): 101.6 lbs Total Body Water (lbs): 66.8 lbs Visceral Fat Rating : 16   Other Clinical Data Fasting: no Labs: no Today's Visit #: 21 Starting Date: 12/24/21     ASSESSMENT AND PLAN:  Diet: Larsyn is currently in the action stage of change. As such, her goal is to continue with weight loss efforts. She has agreed to Category 2 Plan.  Exercise: Kadince has been instructed to try a geriatric exercise plan and that some exercise is better than none for weight loss and overall health benefits.   Behavior Modification:  We discussed the following Behavioral Modification Strategies today: increasing lean protein intake, decreasing simple carbohydrates, increasing vegetables, increase H2O intake, increase high fiber foods, emotional eating strategies , holiday eating strategies, and planning for success. We discussed various medication options to help [facility] with her weight loss efforts and we both agreed to continue Wellbutrin for emotional eating behaviors.  Return in about 4 weeks (around 06/22/2023).Marland Kitchen She was informed of the importance of frequent follow up visits to maximize her success with intensive lifestyle modifications for her multiple health conditions.  Attestation Statements:   Reviewed by clinician on day of visit: allergies,  medications, problem list, medical history, surgical history, family history, social history, and previous encounter notes.   Time spent on visit including pre-visit chart review and post-visit care and charting was 36 minutes.     Terita Hejl, PA-C

## 2023-05-25 ENCOUNTER — Ambulatory Visit (INDEPENDENT_AMBULATORY_CARE_PROVIDER_SITE_OTHER): Payer: Medicare Other | Admitting: Physician Assistant

## 2023-05-25 ENCOUNTER — Encounter (INDEPENDENT_AMBULATORY_CARE_PROVIDER_SITE_OTHER): Payer: Self-pay | Admitting: Physician Assistant

## 2023-05-25 VITALS — BP 120/75 | HR 76 | Temp 98.0°F | Ht 61.0 in | Wt 199.0 lb

## 2023-05-25 DIAGNOSIS — R7303 Prediabetes: Secondary | ICD-10-CM

## 2023-05-25 DIAGNOSIS — Z6837 Body mass index (BMI) 37.0-37.9, adult: Secondary | ICD-10-CM | POA: Diagnosis not present

## 2023-05-25 DIAGNOSIS — E669 Obesity, unspecified: Secondary | ICD-10-CM

## 2023-05-25 DIAGNOSIS — E785 Hyperlipidemia, unspecified: Secondary | ICD-10-CM

## 2023-05-25 DIAGNOSIS — F5089 Other specified eating disorder: Secondary | ICD-10-CM | POA: Diagnosis not present

## 2023-05-25 DIAGNOSIS — I10 Essential (primary) hypertension: Secondary | ICD-10-CM | POA: Diagnosis not present

## 2023-05-25 DIAGNOSIS — M1711 Unilateral primary osteoarthritis, right knee: Secondary | ICD-10-CM | POA: Diagnosis not present

## 2023-05-25 DIAGNOSIS — F3289 Other specified depressive episodes: Secondary | ICD-10-CM

## 2023-06-01 ENCOUNTER — Telehealth (INDEPENDENT_AMBULATORY_CARE_PROVIDER_SITE_OTHER): Payer: Medicare Other | Admitting: Psychology

## 2023-06-01 DIAGNOSIS — F5089 Other specified eating disorder: Secondary | ICD-10-CM

## 2023-06-01 DIAGNOSIS — F439 Reaction to severe stress, unspecified: Secondary | ICD-10-CM | POA: Diagnosis not present

## 2023-06-01 NOTE — Progress Notes (Signed)
  Office: [phone]  /  Fax: [phone]    Date: June 01, 2023  Appointment Start Time: 2:31pm Duration: 18 minutes Provider: Lawerance Cruel, Psy.D. Type of Session: Individual Therapy  Location of Patient: Home (private location) Location of Provider: Provider's Home (private office) Type of Contact: Telepsychological Visit via MyChart Video Visit  Session Content: Olivia Burns is a 72 y.o. female presenting for a follow-up appointment to address the previously established treatment goal of increasing coping skills.Today's appointment was a telepsychological visit. [name] provided verbal consent for today's telepsychological appointment and she is aware she is responsible for securing confidentiality on her end of the session. Prior to proceeding with today's appointment, [name]'s physical location at the time of this appointment was obtained as well a phone number she could be reached at in the event of technical difficulties. [name] and this provider participated in today's telepsychological service.   This provider conducted a brief check-in. [name] shared about recent events, including challenges with sleeping due to her dog. Reviewed eating habits. [name] acknowledged she is "eating too much sugar." However, she stated she is being mindful about protein intake. Reviewed previously established SMART goal. She stated she met her goal and this provider explored what helped her reach her goal. The goal was revised given recent success: [name] will eat dinner congruent to her prescribed structured meal plan at least 5 out of 7 days a week between now and the next appointment with this provider. This provider and [name] also explored barriers to eating dinner congruent to her structured meal plan daily. She noted weekends tend to have less structure. She was engaged in problem solving to help with weekends. [name] agreed to double recipes during the week for the weekend. Overall, [name] was  receptive to today's appointment as evidenced by openness to sharing, responsiveness to feedback, and willingness to work toward the established SMART goal.  Mental Status Examination:  Appearance: neat Behavior: appropriate to circumstances Mood: neutral Affect: mood congruent Speech: WNL Eye Contact: appropriate Psychomotor Activity: WNL Gait: unable to assess Thought Process: linear, logical, and goal directed and no evidence or endorsement of suicidal, homicidal, and self-harm ideation, plan and intent  Thought Content/Perception: no hallucinations, delusions, bizarre thinking or behavior endorsed or observed Orientation: AAOx4 Memory/Concentration: intact Insight: fair Judgment: fair  Interventions:  Conducted a brief chart review Provided empathic reflections and validation Reviewed content from the previous session Provided positive reinforcement Employed supportive psychotherapy interventions to facilitate reduced distress and to improve coping skills with identified stressors Engaged patient in goal setting  DSM-5 Diagnosis(es):  F50.89 Other Specified Feeding or Eating Disorder, Emotional Eating Behaviors and F43.9 Unspecified Trauma- and Stressor-Related Disorder  Treatment Goal & Progress: During the initial appointment with this provider, the following treatment goal was established: increase coping skills. [name] has demonstrated progress in her goal as evidenced by increased awareness of hunger patterns and increased awareness of triggers for emotional eating behaviors. [name] also continues to demonstrate willingness to engage in learned skill(s).  Plan: The next appointment is scheduled for 06/30/2023 at 2:30pm, which will be via MyChart Video Visit. The next session will focus on working towards the established treatment goal. [name] will continue with her primary therapist.    Lawerance Cruel, PsyD

## 2023-06-10 DIAGNOSIS — F5081 Binge eating disorder, mild: Secondary | ICD-10-CM | POA: Diagnosis not present

## 2023-06-10 DIAGNOSIS — F331 Major depressive disorder, recurrent, moderate: Secondary | ICD-10-CM | POA: Diagnosis not present

## 2023-06-10 DIAGNOSIS — Z5181 Encounter for therapeutic drug level monitoring: Secondary | ICD-10-CM | POA: Diagnosis not present

## 2023-06-18 DIAGNOSIS — M545 Low back pain, unspecified: Secondary | ICD-10-CM | POA: Diagnosis not present

## 2023-06-18 DIAGNOSIS — M25562 Pain in left knee: Secondary | ICD-10-CM | POA: Diagnosis not present

## 2023-06-18 DIAGNOSIS — M25561 Pain in right knee: Secondary | ICD-10-CM | POA: Diagnosis not present

## 2023-06-21 NOTE — Progress Notes (Unsigned)
SUBJECTIVE: Discussed the use of AI scribe software for clinical note transcription with the patient, who gave verbal consent to proceed.  Chief Complaint: Obesity  Interim History: She is up 1 lb since her last visit.  Down 23 lbs overall TBW loss of 10.3%  Olivia Burns, a 73 year old patient with a history of prediabetes, hypertension, and emotional eating, presents for a follow-up visit regarding her obesity treatment plan. She reports recent knee trouble, which has kept her from being as active.   The patient sought consultation with an orthopedic specialist, Dr. Luiz Blare, who identified arthritis in both knees. The patient received steroid injections in both knees, which provided relief.  The patient's activity level has been reduced due to knee discomfort, primarily limited to walking her dog, [name]. She has had a slight increase in weight, likely due to decreased activity and reported comfort eating. The patient has been managing her knee discomfort with a knee brace when necessary.  The patient is currently on Wellbutrin 200mg  daily, Crestor 5mg  daily, and Valsartan-Hydrochlorothiazide 80/12.5mg  daily. She recently started sertraline prescribed by her psychiatrist/counselor.  The psychiatrist suggested increasing her usual Wellbutrin to twice daily, which the patient is open to this change in treatment. Reports rare intermittent tremors in hand at times but otherwise no side effects with Wellbutrin.   The patient is also considering participating in the [facility] and [employer], a free exercise program tailored to individual needs, as a means to increase activity levels during the winter months. She expresses satisfaction with her current nutrition plan and reports no hunger when adhering to the plan. Sleep has improved since the knee injections, as knee discomfort was previously disrupting her sleep. Olivia Burns is here to discuss her progress with her obesity treatment  plan. She is on the Category 2 Plan and states she is following her eating plan approximately 75 % of the time. She states she is exercising 15 minutes 7 times per week.   OBJECTIVE: Visit Diagnoses: Problem List Items Addressed This Visit     Essential hypertension - Primary   Depression   Relevant Medications   sertraline (ZOLOFT) 50 MG tablet   buPROPion (WELLBUTRIN SR) 200 MG 12 hr tablet   Obesity with starting BMI of 42.1   BMI 37.0-37.9, adult  Knee Osteoarthritis  Saw Dr. Jodi Geralds for ongoing knee pain with osteoarthritis, described as bone-on-bone on the lateral side of one knee and medial side of the other. Steroid injections bilaterally have provided significant relief. Discussed future options including additional steroid injections, gel injections, physical therapy, and potential knee replacement surgery. Emphasized weight loss and muscle strengthening to preserve joint function and delay surgery. - Continue steroid injections as needed - Consider gel injections if steroid injections are insufficient - Follow up with orthopedic specialist- Dr. Luiz Blare in 3 weeks  Obesity 73 year old with obesity, prediabetes, hypertension, and emotional eating. Decreased activity due to knee pain has likely contributed to weight gain. Advised to focus on protein intake and increase physical activity as tolerated. Discussed participation in the [facility] [employer] program to help increase physical activity. - Encourage participation in the [employer] and [employer] program - Continue current nutrition plan with emphasis on protein intake - Follow up in 4 weeks to monitor progress  Emotional Eating Episodes of emotional eating during periods of decreased activity and increased stress. Working with a Therapist, sports and therapist. Discussed increasing bupropion to 200 mg twice daily to manage emotional eating and mood. Informed about  potential side effects and advised  to monitor for adverse reactions. - Continue working with psychiatrist and therapist - Increase bupropion to 200 mg twice daily Continue sertraline 50 mg daily per Psychiatry.  She continues to work with Dr. Dewaine Conger as well.  - Monitor for side effects and efficacy of increased bupropion dosage  Hypertension Hypertension managed with valsartan hydrochlorothiazide 80/12.5 mg daily. Psychiatry has recommended increasing Wellbutrin to 200 mg BID and we are going to proceed and monitor BP closely for any elevation after increasing the medication.  - Continue valsartan hydrochlorothiazide 80/12.5 mg daily Continue to work on nutrition plan to promote weight loss and improve BP control.    General Health Maintenance Encouraged to maintain general health through physical activity and proper nutrition. Participation in community programs like the [employer] program is recommended. - Encourage participation in community physical activity programs - Emphasize the importance of a balanced diet with adequate protein intake  Follow-up - Follow up in 4 weeks to assess progress and medication efficacy - Schedule follow-up appointment for February 17th at 10:30 AM.  Vitals Temp: 98.5 F (36.9 C) BP: (!) 111/56 Pulse Rate: 94 SpO2: 100 %   Anthropometric Measurements Height: 5\' 1"  (1.549 m) Weight: 200 lb (90.7 kg) BMI (Calculated): 37.81 Weight at Last Visit: 199 lb Weight Lost Since Last Visit: 0 Weight Gained Since Last Visit: 1 lb Starting Weight: 223 lb Total Weight Loss (lbs): 23 lb (10.4 kg) Peak Weight: 238 lb   Body Composition  Body Fat %: 47.6 % Fat Mass (lbs): 95.6 lbs Muscle Mass (lbs): 99.8 lbs Total Body Water (lbs): 67.6 lbs Visceral Fat Rating : 16   Other Clinical Data Fasting: yes Labs: no Today's Visit #: 22 Starting Date: 12/24/21     ASSESSMENT AND PLAN:  Diet: Kaylise is currently in the action stage of change. As such, her goal is to continue with weight  loss efforts. She has agreed to Category 2 Plan.  Exercise: Nyelle has been instructed to try a geriatric exercise plan and discussed trying [employer] program thru [employer] [employer]  for weight loss and overall health benefits.   Behavior Modification:  We discussed the following Behavioral Modification Strategies today: increasing lean protein intake, decreasing simple carbohydrates, increasing vegetables, increase H2O intake, increase high fiber foods, meal planning and cooking strategies, keeping healthy foods in the home, better snacking choices, emotional eating strategies , avoiding temptations, and planning for success. We discussed various medication options to help [facility] with her weight loss efforts and we both agreed to increase Wellbutrin to 200 mg twice daily for emotional eating/cravings.  Return in about 4 weeks (around 07/20/2023).Marland Kitchen She was informed of the importance of frequent follow up visits to maximize her success with intensive lifestyle modifications for her multiple health conditions.  Attestation Statements:   Reviewed by clinician on day of visit: allergies, medications, problem list, medical history, surgical history, family history, social history, and previous encounter notes.   Time spent on visit including pre-visit chart review and post-visit care and charting was 34 minutes.    Alphonse Asbridge, PA-C

## 2023-06-22 ENCOUNTER — Encounter (INDEPENDENT_AMBULATORY_CARE_PROVIDER_SITE_OTHER): Payer: Self-pay | Admitting: Physician Assistant

## 2023-06-22 ENCOUNTER — Ambulatory Visit (INDEPENDENT_AMBULATORY_CARE_PROVIDER_SITE_OTHER): Payer: Medicare Other | Admitting: Physician Assistant

## 2023-06-22 VITALS — BP 111/56 | HR 94 | Temp 98.5°F | Ht 61.0 in | Wt 200.0 lb

## 2023-06-22 DIAGNOSIS — I10 Essential (primary) hypertension: Secondary | ICD-10-CM | POA: Diagnosis not present

## 2023-06-22 DIAGNOSIS — Z6837 Body mass index (BMI) 37.0-37.9, adult: Secondary | ICD-10-CM | POA: Diagnosis not present

## 2023-06-22 DIAGNOSIS — E669 Obesity, unspecified: Secondary | ICD-10-CM | POA: Diagnosis not present

## 2023-06-22 DIAGNOSIS — E785 Hyperlipidemia, unspecified: Secondary | ICD-10-CM

## 2023-06-22 DIAGNOSIS — F5089 Other specified eating disorder: Secondary | ICD-10-CM

## 2023-06-22 DIAGNOSIS — F3289 Other specified depressive episodes: Secondary | ICD-10-CM

## 2023-06-22 DIAGNOSIS — M170 Bilateral primary osteoarthritis of knee: Secondary | ICD-10-CM

## 2023-06-22 DIAGNOSIS — R7303 Prediabetes: Secondary | ICD-10-CM

## 2023-06-22 MED ORDER — BUPROPION HCL ER (SR) 200 MG PO TB12: 200.0000 mg | ORAL_TABLET | Freq: Two times a day (BID) | ORAL | 2 refills | Status: DC

## 2023-06-25 DIAGNOSIS — F331 Major depressive disorder, recurrent, moderate: Secondary | ICD-10-CM | POA: Diagnosis not present

## 2023-06-30 ENCOUNTER — Telehealth (INDEPENDENT_AMBULATORY_CARE_PROVIDER_SITE_OTHER): Payer: Medicare Other | Admitting: Psychology

## 2023-06-30 DIAGNOSIS — F439 Reaction to severe stress, unspecified: Secondary | ICD-10-CM

## 2023-06-30 DIAGNOSIS — F5089 Other specified eating disorder: Secondary | ICD-10-CM

## 2023-06-30 NOTE — Progress Notes (Signed)
  Office: [phone]  /  Fax: [phone]    Date: June 30, 2023  Appointment Start Time: 2:34pm Duration: 19 minutes Provider: Lawerance Cruel, Psy.D. Type of Session: Individual Therapy  Location of Patient: Home (private location) Location of Provider: Provider's Home (private office) Type of Contact: Telepsychological Visit via MyChart Video Visit  Session Content: Olivia Burns is a 73 y.o. female presenting for a follow-up appointment to address the previously established treatment goal of increasing coping skills.Today's appointment was a telepsychological visit. Olivia Burns provided verbal consent for today's telepsychological appointment and she is aware she is responsible for securing confidentiality on her end of the session. Prior to proceeding with today's appointment, Olivia Burns's physical location at the time of this appointment was obtained as well a phone number she could be reached at in the event of technical difficulties. Olivia Burns and this provider participated in today's telepsychological service.   This provider conducted a brief check-in. Olivia Burns described feeling depressed due to conflict with a friend, noting Zoloft has been helpful. She further shared she is journaling her food intake and engaging positive self-talk. She acknowledged she did not meet her established SMART goal for dinner, adding a belief she is not consuming enough protein. Psychoeducation provided regarding habit stacking. Explored current habits and habits she wants to implement as it relates to her eating. Focused on making "stacks" to help her meet her goals with the clinic. Overall, Olivia Burns was receptive to today's appointment as evidenced by openness to sharing, responsiveness to feedback, and willingness to implement discussed strategies .  Mental Status Examination:  Appearance: neat Behavior: appropriate to circumstances Mood: sad Affect: mood congruent Speech: WNL Eye Contact: appropriate Psychomotor  Activity: WNL Gait: unable to assess Thought Process: linear, logical, and goal directed and no evidence or endorsement of suicidal, homicidal, and self-harm ideation, plan and intent  Thought Content/Perception: no hallucinations, delusions, bizarre thinking or behavior endorsed or observed Orientation: AAOx4 Memory/Concentration: intact Insight: fair Judgment: fair  Interventions:  Conducted a brief chart review Provided empathic reflections and validation Reviewed content from the previous session Employed supportive psychotherapy interventions to facilitate reduced distress and to improve coping skills with identified stressors Engaged patient in problem solving Psychoeducation provided regarding habit stacking  DSM-5 Diagnosis(es):  F50.89 Other Specified Feeding or Eating Disorder, Emotional Eating Behaviors and F43.9 Unspecified Trauma- and Stressor-Related Disorder  Treatment Goal & Progress: During the initial appointment with this provider, the following treatment goal was established: increase coping skills. Olivia Burns has demonstrated progress in her goal as evidenced by increased awareness of hunger patterns and increased awareness of triggers for emotional eating behaviors. Olivia Burns also continues to demonstrate willingness to engage in learned skill(s).  Plan: The next appointment is scheduled for 07/27/2023 at 2:30pm, which will be via MyChart Video Visit. The next session will focus on working towards the established treatment goal. Olivia Burns will continue with her primary therapist.    Lawerance Cruel, PsyD

## 2023-07-07 ENCOUNTER — Ambulatory Visit (INDEPENDENT_AMBULATORY_CARE_PROVIDER_SITE_OTHER): Payer: Medicare Other

## 2023-07-07 VITALS — Ht 61.0 in | Wt 196.0 lb

## 2023-07-07 DIAGNOSIS — Z0000 Encounter for general adult medical examination without abnormal findings: Secondary | ICD-10-CM

## 2023-07-07 NOTE — Progress Notes (Signed)
 Subjective:   Olivia Burns is a 73 y.o. female who presents for Medicare Annual (Subsequent) preventive examination.  Visit Complete: Virtual I connected with  Olivia Burns on 07/07/23 by a audio enabled telemedicine application and verified that I am speaking with the correct person using two identifiers.  Patient Location: Home  Provider Location: Home Office  I discussed the limitations of evaluation and management by telemedicine. The patient expressed understanding and agreed to proceed.  Vital Signs: Because this visit was a virtual/telehealth visit, some criteria may be missing or patient reported. Any vitals not documented were not able to be obtained and vitals that have been documented are patient reported.  Patient Medicare AWV questionnaire was completed by the patient on 07/06/23; I have confirmed that all information answered by patient is correct and no changes since this date.  Cardiac Risk Factors include: advanced age (>57men, >80 women);hypertension     Objective:    Today's Vitals   07/07/23 0934  Weight: 196 lb (88.9 kg)  Height: 5' 1 (1.549 m)   Body mass index is 37.03 kg/m.     07/07/2023    9:42 AM 04/17/2023    9:48 AM 04/08/2022    9:44 AM 04/04/2021    9:46 AM  Advanced Directives  Does Patient Have a Medical Advance Directive? No No No No  Would patient like information on creating a medical advance directive? No - Patient declined No - Patient declined No - Patient declined Yes (MAU/Ambulatory/Procedural Areas - Information given)    Current Medications (verified) Outpatient Encounter Medications as of 07/07/2023  Medication Sig   Ascorbic Acid (VITAMIN C-ROSE HIPS-ACEROLA PO) Take 1 capsule by mouth daily.   b complex vitamins capsule Take 1 capsule by mouth daily.   buPROPion  (WELLBUTRIN  SR) 200 MG 12 hr tablet Take 1 tablet (200 mg total) by mouth 2 (two) times daily.   co-enzyme Q-10 30 MG capsule Take 30 mg by mouth 3 (three) times  daily.   Docusate Calcium  (STOOL SOFTENER PO) Stool softener   ferrous gluconate  (FERGON) 324 MG tablet TAKE 1 TABLET BY MOUTH DAILY WITH BREAKFAST (Patient not taking: Reported on 06/22/2023)   fluticasone furoate-vilanterol (BREO ELLIPTA) 100-25 MCG/ACT AEPB Inhale 1 puff into the lungs daily as needed.   linaclotide (LINZESS) 145 MCG CAPS capsule Take 145 mcg by mouth daily before breakfast. (Patient not taking: Reported on 06/22/2023)   MAGNESIUM GLYCINATE PO Take 400 capsules by mouth daily.   Menaquinone-7 (VITAMIN K2) 100 MCG CAPS Take 100 mcg by mouth.   milk thistle 175 MG tablet Take 175 mg by mouth daily.   Multiple Vitamins-Minerals (CENTRUM SILVER 50+WOMEN) TABS Take 1 tablet by mouth daily.   omeprazole (PRILOSEC) 20 MG capsule Take 40 mg by mouth daily.   rosuvastatin  (CRESTOR ) 5 MG tablet Take 1 tablet (5 mg total) by mouth daily.   sertraline (ZOLOFT) 50 MG tablet Take 50 mg by mouth daily.   TURMERIC PO Take 1,000 mg by mouth daily.   UNABLE TO FIND Med Name: [name]   UNABLE TO FIND Med Name: Ceylon Cinnamon   valsartan -hydrochlorothiazide  (DIOVAN -HCT) 80-12.5 MG tablet Take 1 tablet by mouth daily.   No facility-administered encounter medications on file as of 07/07/2023.    Allergies (verified) Codeine, Naproxen, Penicillins, and Latex   History: Past Medical History:  Diagnosis Date   Allergy    Anemia    Anxiety    Arthritis    Asthma    Constipation  Depression    GERD (gastroesophageal reflux disease)    Hyperlipidemia    Hypertension    Osteoarthritis    Pre-diabetes    Sleep apnea    Past Surgical History:  Procedure Laterality Date   ABDOMINAL HYSTERECTOMY  2001   ESOPHAGOGASTRODUODENOSCOPY (EGD) WITH PROPOFOL  N/A 04/17/2023   Procedure: ESOPHAGOGASTRODUODENOSCOPY (EGD) WITH PROPOFOL ;  Surgeon: Rollin Dover, MD;  Location: [facility];  Service: Gastroenterology;  Laterality: N/A;   EYE SURGERY     HEMORRHOID SURGERY  1975    MENISCUS REPAIR Left 1999   miscarriage  1977 and 1979   POLYPECTOMY  04/17/2023   Procedure: POLYPECTOMY;  Surgeon: Rollin Dover, MD;  Location: [facility];  Service: Gastroenterology;;   HARLEY DILATION N/A 04/17/2023   Procedure: HARLEY DILATION;  Surgeon: Rollin Dover, MD;  Location: [facility];  Service: Gastroenterology;  Laterality: N/A;   Family History  Problem Relation Age of Onset   Arthritis Mother    Asthma Mother    Depression Mother    Diabetes Mother    Heart disease Mother    Kidney disease Mother    Arthritis Father    Depression Father    Hyperlipidemia Father    Hypertension Father    Cancer Father        prostate   Cancer Sister        gastric vs pancreatic   Drug abuse Brother    Early death Brother    Social History   Socioeconomic History   Marital status: Single    Spouse name: Not on file   Number of children: Not on file   Years of education: Not on file   Highest education level: Some college, no degree  Occupational History   Not on file  Tobacco Use   Smoking status: Former    Types: Cigarettes   Smokeless tobacco: Never   Tobacco comments:    Tried smoking   Vaping Use   Vaping status: Never Used  Substance and Sexual Activity   Alcohol use: Yes    Comment: occasional   Drug use: Never   Sexual activity: Not on file  Other Topics Concern   Not on file  Social History Narrative   Not on file   Social Drivers of Health   Financial Resource Strain: Low Risk  (07/07/2023)   Overall Financial Resource Strain (CARDIA)    Difficulty of Paying Living Expenses: Not very hard  Food Insecurity: No Food Insecurity (07/07/2023)   Hunger Vital Sign    Worried About Running Out of Food in the Last Year: Never true    Ran Out of Food in the Last Year: Never true  Transportation Needs: No Transportation Needs (07/07/2023)   PRAPARE - Administrator, Civil Service (Medical): No    Lack of Transportation (Non-Medical): No   Physical Activity: Insufficiently Active (07/07/2023)   Exercise Vital Sign    Days of Exercise per Week: 1 day    Minutes of Exercise per Session: 20 min  Stress: Stress Concern Present (07/07/2023)   [employer] of Occupational Health - Occupational Stress Questionnaire    Feeling of Stress : To some extent  Social Connections: Socially Isolated (07/07/2023)   Social Connection and Isolation Panel [NHANES]    Frequency of Communication with Friends and Family: Once a week    Frequency of Social Gatherings with Friends and Family: Once a week    Attends Religious Services: Never    Database Administrator or  Organizations: No    Attends Engineer, Structural: Not on file    Marital Status: Never married    Tobacco Counseling Counseling given: Not Answered Tobacco comments: Tried smoking    Clinical Intake:  Pre-visit preparation completed: Yes  Pain : No/denies pain     BMI - recorded: 37.03 Nutritional Status: BMI > 30  Obese Nutritional Risks: None Diabetes: No  How often do you need to have someone help you when you read instructions, pamphlets, or other written materials from your doctor or pharmacy?: 1 - Never  Interpreter Needed?: No  Information entered by :: Rojelio Blush LPN   Activities of Daily Living    07/07/2023    9:41 AM 07/06/2023    2:55 PM  In your present state of health, do you have any difficulty performing the following activities:  Hearing? 0 0  Vision? 0 0  Difficulty concentrating or making decisions? 0 0  Walking or climbing stairs? 0 0  Dressing or bathing? 0 0  Doing errands, shopping? 0 0  Preparing Food and eating ? N N  Using the Toilet? N N  In the past six months, have you accidently leaked urine? N N  Do you have problems with loss of bowel control? N N  Managing your Medications? N N  Managing your Finances? N N  Housekeeping or managing your Housekeeping? N N    Patient Care Team: Frann Mabel Mt, DO  as PCP - General (Family Medicine)  Indicate any recent Medical Services you may have received from other than [facility] providers in the past year (date may be approximate).     Assessment:   This is a routine wellness examination for [place].  Hearing/Vision screen Hearing Screening - Comments:: Denies hearing difficulties   Vision Screening - Comments:: Wears rx glasses - up to date with routine eye exams with  [facility]   Goals Addressed               This Visit's Progress     Increase physical activity (pt-stated)         Depression Screen    07/07/2023    9:39 AM 04/13/2023    9:28 AM 04/08/2022    9:44 AM 12/24/2021    9:06 AM 04/04/2021    9:51 AM  PHQ 2/9 Scores  PHQ - 2 Score 0 0 2 4 0  PHQ- 9 Score    11     Fall Risk    07/07/2023    9:41 AM 07/06/2023    2:55 PM 04/13/2023    9:28 AM 10/10/2022    9:39 AM 04/08/2022    9:44 AM  Fall Risk   Falls in the past year? 0 0 0 0 0  Number falls in past yr: 0 0 0 0 0  Injury with Fall? 0 0 0 0 0  Risk for fall due to : No Fall Risks    No Fall Risks  Follow up Falls prevention discussed  Falls evaluation completed  Falls evaluation completed    MEDICARE RISK AT HOME: Medicare Risk at Home Any stairs in or around the home?: Yes If so, are there any without handrails?: No Home free of loose throw rugs in walkways, pet beds, electrical cords, etc?: Yes Adequate lighting in your home to reduce risk of falls?: Yes Life alert?: No Use of a cane, walker or w/c?: No Grab bars in the bathroom?: No Shower chair or bench in shower?: No  Elevated toilet seat or a handicapped toilet?: Yes  TIMED UP AND GO:  Was the test performed?  No    Cognitive Function:        07/07/2023    9:42 AM 04/08/2022    9:49 AM  6CIT Screen  What Year? 0 points 0 points  What month? 0 points 0 points  What time? 0 points 0 points  Count back from 20 0 points 0 points  Months in reverse 0 points 0 points  Repeat phrase 0 points 0  points  Total Score 0 points 0 points    Immunizations Immunization History  Administered Date(s) Administered   Influenza, High Dose Seasonal PF 03/07/2020, 02/18/2022   Influenza-Unspecified 03/02/2018, 03/04/2021, 02/18/2022, 09/05/2022, 03/06/2023   Novavax(Covid-19) Vaccine 09/05/2022   PFIZER Comirnaty(Gray Top)Covid-19 Tri-Sucrose Vaccine 03/08/2023   PFIZER(Purple Top)SARS-COV-2 Vaccination 07/16/2019, 08/10/2019, 03/09/2020, 11/01/2020   PNEUMOCOCCAL CONJUGATE-20 11/01/2020   Pfizer Covid-19 Vaccine Bivalent Booster 30yrs & up 02/26/2021   Pfizer(Comirnaty)Fall Seasonal Vaccine 12 years and older 02/18/2022   Tdap 10/06/2022   Zoster Recombinant(Shingrix) 09/05/2022, 11/06/2022    TDAP status: Up to date  Flu Vaccine status: Up to date  Pneumococcal vaccine status: Up to date  Covid-19 vaccine status: Declined, Education has been provided regarding the importance of this vaccine but patient still declined. Advised may receive this vaccine at local pharmacy or Health Dept.or vaccine clinic. Aware to provide a copy of the vaccination record if obtained from local pharmacy or Health Dept. Verbalized acceptance and understanding.  Qualifies for Shingles Vaccine? Yes   Zostavax completed Yes   Shingrix Completed?: Yes  Screening Tests Health Maintenance  Topic Date Due   COVID-19 Vaccine (9 - 2024-25 season) 05/03/2023   Medicare Annual Wellness (AWV)  07/06/2024   MAMMOGRAM  08/03/2024   DTaP/Tdap/Td (2 - Td or Tdap) 10/05/2032   Colonoscopy  12/16/2032   Pneumonia Vaccine 6+ Years old  Completed   INFLUENZA VACCINE  Completed   DEXA SCAN  Completed   Hepatitis C Screening  Completed   Zoster Vaccines- Shingrix  Completed   HPV VACCINES  Aged Out    Health Maintenance  Health Maintenance Due  Topic Date Due   COVID-19 Vaccine (9 - 2024-25 season) 05/03/2023    Colorectal cancer screening: Type of screening: Colonoscopy. Completed 12/17/22. Repeat every 10  years  Mammogram status: Completed 08/03/22. Repeat every year  Bone Density status: Completed 04/11/21. Results reflect: Bone density results: NORMAL. Repeat every   years.     Additional Screening:  Hepatitis C Screening: does qualify; Completed 10/07/21  Vision Screening: Recommended annual ophthalmology exams for early detection of glaucoma and other disorders of the eye. Is the patient up to date with their annual eye exam?  Yes  Who is the provider or what is the name of the office in which the patient attends annual eye exams? [facility] If pt is not established with a provider, would they like to be referred to a provider to establish care? No .   Dental Screening: Recommended annual dental exams for proper oral hygiene    Community Resource Referral / Chronic Care Management:  CRR required this visit?  No   CCM required this visit?  No     Plan:     I have personally reviewed and noted the following in the patient's chart:   Medical and social history Use of alcohol, tobacco or illicit drugs  Current medications and supplements including opioid prescriptions. Patient is not  currently taking opioid prescriptions. Functional ability and status Nutritional status Physical activity Advanced directives List of other physicians Hospitalizations, surgeries, and ER visits in previous 12 months Vitals Screenings to include cognitive, depression, and falls Referrals and appointments  In addition, I have reviewed and discussed with patient certain preventive protocols, quality metrics, and best practice recommendations. A written personalized care plan for preventive services as well as general preventive health recommendations were provided to patient.     Rojelio LELON Blush, LPN   12/04/7972   After Visit Summary: (MyChart) Due to this being a telephonic visit, the after visit summary with patients personalized plan was offered to patient via MyChart   Nurse Notes:  None

## 2023-07-07 NOTE — Patient Instructions (Addendum)
Olivia Burns , Thank you for taking time to come for your Medicare Wellness Visit. I appreciate your ongoing commitment to your health goals. Please review the following plan we discussed and let me know if I can assist you in the future.   Referrals/Orders/Follow-Ups/Clinician Recommendations:   This is a list of the screening recommended for you and due dates:  Health Maintenance  Topic Date Due   COVID-19 Vaccine (9 - 2024-25 season) 05/03/2023   Medicare Annual Wellness Visit  07/06/2024   Mammogram  08/03/2024   DTaP/Tdap/Td vaccine (2 - Td or Tdap) 10/05/2032   Colon Cancer Screening  12/16/2032   Pneumonia Vaccine  Completed   Flu Shot  Completed   DEXA scan (bone density measurement)  Completed   Hepatitis C Screening  Completed   Zoster (Shingles) Vaccine  Completed   HPV Vaccine  Aged Out    Advanced directives: (Declined) Advance directive discussed with you today. Even though you declined this today, please call our office should you change your mind, and we can give you the proper paperwork for you to fill out.  Next Medicare Annual Wellness Visit scheduled for next year: Yes

## 2023-07-09 DIAGNOSIS — Z860100 Personal history of colon polyps, unspecified: Secondary | ICD-10-CM | POA: Diagnosis not present

## 2023-07-09 DIAGNOSIS — K5900 Constipation, unspecified: Secondary | ICD-10-CM | POA: Diagnosis not present

## 2023-07-09 DIAGNOSIS — K219 Gastro-esophageal reflux disease without esophagitis: Secondary | ICD-10-CM | POA: Diagnosis not present

## 2023-07-13 DIAGNOSIS — F339 Major depressive disorder, recurrent, unspecified: Secondary | ICD-10-CM | POA: Diagnosis not present

## 2023-07-16 DIAGNOSIS — M170 Bilateral primary osteoarthritis of knee: Secondary | ICD-10-CM | POA: Diagnosis not present

## 2023-07-16 DIAGNOSIS — M1711 Unilateral primary osteoarthritis, right knee: Secondary | ICD-10-CM | POA: Diagnosis not present

## 2023-07-20 ENCOUNTER — Encounter (INDEPENDENT_AMBULATORY_CARE_PROVIDER_SITE_OTHER): Payer: Self-pay | Admitting: Physician Assistant

## 2023-07-20 ENCOUNTER — Ambulatory Visit (INDEPENDENT_AMBULATORY_CARE_PROVIDER_SITE_OTHER): Payer: Medicare Other | Admitting: Physician Assistant

## 2023-07-20 VITALS — BP 130/74 | HR 63 | Temp 98.3°F | Ht 61.0 in | Wt 199.0 lb

## 2023-07-20 DIAGNOSIS — R7303 Prediabetes: Secondary | ICD-10-CM | POA: Diagnosis not present

## 2023-07-20 DIAGNOSIS — I10 Essential (primary) hypertension: Secondary | ICD-10-CM

## 2023-07-20 DIAGNOSIS — E669 Obesity, unspecified: Secondary | ICD-10-CM | POA: Diagnosis not present

## 2023-07-20 DIAGNOSIS — F5089 Other specified eating disorder: Secondary | ICD-10-CM

## 2023-07-20 DIAGNOSIS — F3289 Other specified depressive episodes: Secondary | ICD-10-CM

## 2023-07-20 DIAGNOSIS — M170 Bilateral primary osteoarthritis of knee: Secondary | ICD-10-CM

## 2023-07-20 DIAGNOSIS — Z6837 Body mass index (BMI) 37.0-37.9, adult: Secondary | ICD-10-CM | POA: Diagnosis not present

## 2023-07-20 NOTE — Progress Notes (Signed)
 SUBJECTIVE: Discussed the use of AI scribe software for clinical note transcription with the patient, who gave verbal consent to proceed.  Chief Complaint: Obesity  Interim History: She is down 1 lb since her last visit.  Down 24 lbs overall TBW loss of 10.8%   Olivia Burns is here to discuss her progress with her obesity treatment plan. She is on the Category 2 Plan and states she is following her eating plan approximately 50 % of the time. She states she is exercising 20 3 time daily minutes 5-7 times per week. Olivia Burns is a 73 year old female who presents for follow-up of her obesity treatment plan.  She is focusing on meal planning and preparation as part of her obesity treatment plan. This includes cooking eggs in advance for breakfast and incorporating more fish into her diet, such as Olivia Burns and Olivia Burns. She has lost 24 pounds and is working on maintaining her muscle mass. She does better with fish than with chicken or Olivia Burns and is using canned Olivia Burns and Olivia Burns more frequently. She also prepares chili with ground Olivia Burns and beans, avoiding tomatoes due to an allergy.  She has a history of emotional eating behaviors and has been working on this with emotional therapy with Dr. Dewaine Conger. Meal prepping helps her stay on track with her eating plan.  She is using Wellbutrin, which helps with her cravings when taken twice a day. She notes that it 'really settles me down' but also mentions experiencing shakiness, particularly in one hand at times, but feels the benefits of the increased Wellbutrin is noticeable and would like to continue on the twice daily dosing for now.  She has a history of knee arthritis and received a shot last week that has significantly improved her knee pain, allowing her to walk more comfortably. She is more mobile now and is able to walk her dog more frequently, although her dog does not enjoy the cold weather.  OBJECTIVE: Visit Diagnoses: Problem List Items Addressed  This Visit     Essential hypertension - Primary   Prediabetes   Depression   Obesity with starting BMI of 42.1   BMI 37.0-37.9, adult   Other Visit Diagnoses       Primary osteoarthritis of both knees         Obesity Olivia Burns, 73 year old female, has lost 24 pounds overall. Notes improvements in meal planning and prepping.  Emotional eating behaviors have been addressed with Dr. Dewaine Conger. She is maintaining muscle mass and increasing mobility. She prefers not to pursue knee replacement surgery at this time despite her orthopedic surgeon's recommendation. - Continue current diet plan with emphasis on meal prepping - Incorporate more fish, such as canned Olivia Burns, Olivia Burns, and sardines for omega-3 fatty acids - Follow up in one month to assess progress  Emotional Eating Emotional eating behaviors have been addressed with Dr. Dewaine Conger. Meal prepping has helped her stay on track with her diet. On Wellbutrin SR 200 mg BID. No side effects noted.  Continue Wellbutrin SR 200 mg twice.  - Continue emotional therapy with Dr. Dewaine Conger - Follow up with Dr. Dewaine Conger on February 24 for a video visit  Hypertension Hypertension improved, asymptomatic, reasonably well controlled, and no significant medication side effects noted.  Medication(s): valsartan-hydrochlorothiazide 80-12.5 mg daily.   BP Readings from Last 3 Encounters:  07/20/23 130/74  06/22/23 (!) 111/56  05/25/23 120/75   Lab Results  Component Value Date   CREATININE 1.00 04/13/2023   CREATININE 0.91 10/10/2022  CREATININE 0.87 12/24/2021   Lab Results  Component Value Date   GFR 56.09 (L) 04/13/2023   GFR 63.04 10/10/2022   GFR 66.21 07/09/2021    Plan: Continue all antihypertensives at current dosages. Continue to work on nutrition plan to promote weight loss and improve BP control.    General Health Maintenance Discussion about incorporating more fruits and vegetables into the diet, especially during winter months. She  has been eating apples and considering other options. - Continue eating apples and consider other fruits and vegetables - Incorporate canned Olivia Burns or Olivia Burns into meals for additional protein and omega-3 fatty acids  Follow-up - Follow up on March 17 at 11 AM.  Vitals Temp: 98.3 F (36.8 C) BP: 130/74 Pulse Rate: 63 SpO2: 96 %   Anthropometric Measurements Height: 5\' 1"  (1.549 m) Weight: 199 lb (90.3 kg) BMI (Calculated): 37.62 Weight at Last Visit: 200lb Weight Lost Since Last Visit: 1lb Weight Gained Since Last Visit: 0 Starting Weight: 223lb Total Weight Loss (lbs): 24 lb (10.9 kg) Peak Weight: 238lb   Body Composition  Body Fat %: 47.3 % Fat Mass (lbs): 94.4 lbs Muscle Mass (lbs): 99.8 lbs Total Body Water (lbs): 68.6 lbs Visceral Fat Rating : 16   Other Clinical Data Fasting: no Labs: no Today's Visit #: 23 Starting Date: 12/24/21     ASSESSMENT AND PLAN:  Diet: Olivia Burns is currently in the action stage of change. As such, her goal is to continue with weight loss efforts. She has agreed to Category 2 Plan.  Exercise: Olivia Burns has been instructed to work up to a goal of 150 minutes of combined cardio and strengthening exercise per week for weight loss and overall health benefits.   Behavior Modification:  We discussed the following Behavioral Modification Strategies today: increasing lean protein intake, decreasing simple carbohydrates, increasing vegetables, increase H2O intake, increase high fiber foods, meal planning and cooking strategies, better snacking choices, emotional eating strategies , avoiding temptations, and planning for success. We discussed various medication options to help Olivia Burns with her weight loss efforts and we both agreed to continue wellbutrin 200 mg twice daily for emotional eating behaviors and continue to work on nutritional and behavioral strategies to promote weight loss.  .  Return in about 4 weeks (around 08/17/2023).Marland Kitchen She was  informed of the importance of frequent follow up visits to maximize her success with intensive lifestyle modifications for her multiple health conditions.  Attestation Statements:   Reviewed by clinician on day of visit: allergies, medications, problem list, medical history, surgical history, family history, social history, and previous encounter notes.   Time spent on visit including pre-visit chart review and post-visit care and charting was 30 minutes.    Rehman Levinson, PA-C

## 2023-07-21 DIAGNOSIS — F339 Major depressive disorder, recurrent, unspecified: Secondary | ICD-10-CM | POA: Diagnosis not present

## 2023-07-27 ENCOUNTER — Telehealth (INDEPENDENT_AMBULATORY_CARE_PROVIDER_SITE_OTHER): Payer: Medicare Other | Admitting: Psychology

## 2023-07-27 DIAGNOSIS — F439 Reaction to severe stress, unspecified: Secondary | ICD-10-CM

## 2023-07-27 DIAGNOSIS — F5089 Other specified eating disorder: Secondary | ICD-10-CM

## 2023-07-27 NOTE — Progress Notes (Signed)
  Office: [phone]  /  Fax: [phone]    Date: July 27, 2023  Appointment Start Time: 2:01pm Duration: 17 minutes Provider: Lawerance Cruel, Psy.D. Type of Session: Individual Therapy  Location of Patient: Home (private location) Location of Provider: Provider's Home (private office) Type of Contact: Telepsychological Visit via MyChart Video Visit  Session Content: Olivia Burns is a 73 y.o. female presenting for a follow-up appointment to address the previously established treatment goal of increasing coping skills.Today's appointment was a telepsychological visit. Olivia Burns provided verbal consent for today's telepsychological appointment and she is aware she is responsible for securing confidentiality on her end of the session. Prior to proceeding with today's appointment, Olivia Burns's physical location at the time of this appointment was obtained as well a phone number she could be reached at in the event of technical difficulties. Olivia Burns and this provider participated in today's telepsychological service.   This provider conducted a brief check-in. Olivia Burns shared about recent events, noting her dog is sick. Reviewed habit stacking. She reported following her stacks and noted an improvement in eating habits. Olivia Burns continues to report a reduction in emotional eating behaviors. Termination planning was discussed. Olivia Burns requested today be the last appointment as she has a primary therapist. A plan was developed to help Olivia Burns cope with emotional eating in the future using learned skills. She wrote down the following plan: focus on hydration; be prepared with snacks congruent to the meal plan; pause to ask questions when triggered to eat (e.g., Am I really hungry?, Is there something bothering me?, and Will I feel better if I eat?); and engage in discussed coping strategies after going through the aforementioned questions (e.g., thought defusion; pleasurable activities).  Overall, Olivia Burns was receptive to  today's appointment as evidenced by openness to sharing, responsiveness to feedback, and willingness to continue engaging in learned skills.  Mental Status Examination:  Appearance: neat Behavior: appropriate to circumstances Mood: sad Affect: mood congruent Speech: WNL Eye Contact: appropriate Psychomotor Activity: WNL Gait: unable to assess Thought Process: linear, logical, and goal directed and no evidence or endorsement of suicidal, homicidal, and self-harm ideation, plan and intent  Thought Content/Perception: no hallucinations, delusions, bizarre thinking or behavior endorsed or observed Orientation: AAOx4 Memory/Concentration: intact Insight: good Judgment: good  Interventions:  Conducted a brief chart review Provided empathic reflections and validation Reviewed content from the previous session Provided positive reinforcement Employed supportive psychotherapy interventions to facilitate reduced distress and to improve coping skills with identified stressors Reviewed learned skills Discussed termination planning  DSM-5 Diagnosis(es):  F50.89 Other Specified Feeding or Eating Disorder, Emotional Eating Behaviors and F43.9 Unspecified Trauma- and Stressor-Related Disorder  Treatment Goal & Progress: During the initial appointment with this provider, the following treatment goal was established: increase coping skills. Olivia Burns demonstrated progress in her goal as evidenced by increased awareness of hunger patterns, increased awareness of triggers for emotional eating behaviors, and reduction in emotional eating behaviors . Olivia Burns also continues to demonstrate willingness to engage in learned skill(s).  Plan: Today was Olivia Burns's last appointment with this provider. She acknowledged understanding that she may request a follow-up appointment with this provider in the future as long as she is still established with the clinic. Olivia Burns will continue with her primary therapist and  psychiatric provider. No further follow-up planned by this provider.    Lawerance Cruel, PsyD

## 2023-07-28 DIAGNOSIS — M25662 Stiffness of left knee, not elsewhere classified: Secondary | ICD-10-CM | POA: Diagnosis not present

## 2023-07-28 DIAGNOSIS — M25661 Stiffness of right knee, not elsewhere classified: Secondary | ICD-10-CM | POA: Diagnosis not present

## 2023-08-05 DIAGNOSIS — M25662 Stiffness of left knee, not elsewhere classified: Secondary | ICD-10-CM | POA: Diagnosis not present

## 2023-08-05 DIAGNOSIS — R531 Weakness: Secondary | ICD-10-CM | POA: Diagnosis not present

## 2023-08-05 DIAGNOSIS — M170 Bilateral primary osteoarthritis of knee: Secondary | ICD-10-CM | POA: Diagnosis not present

## 2023-08-05 DIAGNOSIS — M25661 Stiffness of right knee, not elsewhere classified: Secondary | ICD-10-CM | POA: Diagnosis not present

## 2023-08-10 DIAGNOSIS — Z1231 Encounter for screening mammogram for malignant neoplasm of breast: Secondary | ICD-10-CM | POA: Diagnosis not present

## 2023-08-10 LAB — HM MAMMOGRAPHY

## 2023-08-13 DIAGNOSIS — M25662 Stiffness of left knee, not elsewhere classified: Secondary | ICD-10-CM | POA: Diagnosis not present

## 2023-08-13 DIAGNOSIS — M25661 Stiffness of right knee, not elsewhere classified: Secondary | ICD-10-CM | POA: Diagnosis not present

## 2023-08-13 DIAGNOSIS — R531 Weakness: Secondary | ICD-10-CM | POA: Diagnosis not present

## 2023-08-13 DIAGNOSIS — M170 Bilateral primary osteoarthritis of knee: Secondary | ICD-10-CM | POA: Diagnosis not present

## 2023-08-17 ENCOUNTER — Ambulatory Visit (INDEPENDENT_AMBULATORY_CARE_PROVIDER_SITE_OTHER): Payer: Medicare Other | Admitting: Physician Assistant

## 2023-08-17 ENCOUNTER — Encounter (INDEPENDENT_AMBULATORY_CARE_PROVIDER_SITE_OTHER): Payer: Self-pay | Admitting: Physician Assistant

## 2023-08-17 ENCOUNTER — Encounter: Payer: Self-pay | Admitting: Family Medicine

## 2023-08-17 VITALS — BP 94/58 | HR 82 | Temp 98.6°F | Ht 61.0 in | Wt 200.0 lb

## 2023-08-17 DIAGNOSIS — T881XXA Other complications following immunization, not elsewhere classified, initial encounter: Secondary | ICD-10-CM

## 2023-08-17 DIAGNOSIS — I10 Essential (primary) hypertension: Secondary | ICD-10-CM | POA: Diagnosis not present

## 2023-08-17 DIAGNOSIS — E669 Obesity, unspecified: Secondary | ICD-10-CM

## 2023-08-17 DIAGNOSIS — M170 Bilateral primary osteoarthritis of knee: Secondary | ICD-10-CM

## 2023-08-17 DIAGNOSIS — F3289 Other specified depressive episodes: Secondary | ICD-10-CM

## 2023-08-17 DIAGNOSIS — Z6837 Body mass index (BMI) 37.0-37.9, adult: Secondary | ICD-10-CM

## 2023-08-17 MED ORDER — BUPROPION HCL ER (SR) 200 MG PO TB12: 200.0000 mg | ORAL_TABLET | Freq: Every day | ORAL | 2 refills | Status: DC

## 2023-08-17 NOTE — Progress Notes (Signed)
 SUBJECTIVE: Discussed the use of AI scribe software for clinical note transcription with the patient, who gave verbal consent to proceed.  Chief Complaint: Obesity  Interim History: She is up 1 lb since her last visit. Down 23 lbs overall TBW loss of 10.3%  Olivia Burns is here to discuss her progress with her obesity treatment plan. She is on the Category 2 Plan and states she is following her eating plan approximately 75 % of the time. She states she is exercising rowing/walking dog 10/20 minutes 3-4/7 times per week.  Olivia Burns is a 73 year old female who presents for follow-up of her obesity treatment plan.  She has gained one pound since her last visit. Her medical history includes prediabetes, hypertension, and emotional eating behavior. Current medications include bupropion 200 mg twice daily, Crestor 5 mg daily for hypercholesterolemia, and valsartan hydrochlorothiazide 80/12.5 mg daily for blood pressure management. She also takes B complex vitamins.  She is not consuming all the food listed in her program and is not drinking enough water. She attributes a decrease in appetite to the increase in her Wellbutrin dosage. Her typical breakfast consists of two boiled eggs and sometimes a slice of bread with cheese. If in a hurry, she opts for a protein shake. Dinner often includes a sandwich or a salad with chicken. She consumes walnuts and occasionally drinks Fairlife milk to meet her protein goals.  She has recently started exercising on a rowing machine for ten minutes, three to four days a week, and continues to walk her dog for twenty minutes, seven times a week. Her knee pain, previously managed with steroid injections, is now treated with Celebrex as needed. She performs physical therapy exercises at home to strengthen her knees.  She received her last COVID shot yesterday and is experiencing some itchiness at the injection site. Her blood pressure was noted to be lower than usual  at 94/58, but she does not report feeling unwell. She plans to take her allergy medication when she gets home. OBJECTIVE: Visit Diagnoses: Problem List Items Addressed This Visit     Essential hypertension - Primary   Depression   Relevant Medications   traZODone (DESYREL) 50 MG tablet   buPROPion (WELLBUTRIN SR) 200 MG 12 hr tablet   Obesity with starting BMI of 42.1   BMI 37.0-37.9, adult   Other Visit Diagnoses       Primary osteoarthritis of both knees       Relevant Medications   celecoxib (CELEBREX) 200 MG capsule     Local reaction to COVID-19 vaccine         Obesity She has gained one pound since her last visit. Her exercise regimen includes rowing for 10 minutes, 3-4 days a week, and walking her dog for 20 minutes daily. Bupropion may be affecting her appetite, leading to under-eating and potentially impacting her metabolism. - Adjust bupropion to 200 mg once daily in the morning and monitor appetite/intake of protein. - Encourage adequate protein intake and hydration. - Continue exercise regimen with rowing and walking. - Reassess weight and appetite in 4 weeks.  Knee Pain She experiences knee pain managed with steroid injections and Celebrex as needed. Physical therapy and rowing exercises are incorporated to strengthen the knee, upper body, and core. - Continue Celebrex as needed for knee pain. - Continue physical therapy exercises at home. - Maintain rowing exercise as part of knee strengthening regimen.  Hypertension Her blood pressure is 94/58 mmHg today- A little lower  than usual. This may be possibly related to recent COVID-19 vaccination and inadequate fluid intake. Her blood pressure has never been excessively high. - Encourage increased fluid intake, especially post-vaccination. - Monitor blood pressure regularly.  COVID-19 Vaccination Reaction She experiences localized itching at the injection site following her COVID-19 vaccination. She has mild reactions  to previous vaccinations. - Take antihistamine for itching. - Apply cortisone cream to the injection site if needed. - Monitor for any worsening symptoms or systemic reactions and seek medical care if any concerns.   General Health Maintenance Adequate hydration and protein intake are advised to support overall health and weight management. Regular allergy medication is recommended to manage symptoms. - Encourage regular hydration and protein intake. - Take allergy medication as needed.  Vitals Temp: 98.6 F (37 C) BP: (!) 94/58 Pulse Rate: 82 SpO2: 100 %   Anthropometric Measurements Height: 5\' 1"  (1.549 m) Weight: 200 lb (90.7 kg) BMI (Calculated): 37.81 Weight at Last Visit: 199 lb Weight Lost Since Last Visit: 0 Weight Gained Since Last Visit: 1 lb Starting Weight: 223 lb Total Weight Loss (lbs): 23 lb (10.4 kg) Peak Weight: 238 lb   Body Composition  Body Fat %: 47.7 % Fat Mass (lbs): 95.8 lbs Muscle Mass (lbs): 99.6 lbs Total Body Water (lbs): 70.2 lbs Visceral Fat Rating : 16   Other Clinical Data Fasting: yes Labs: no Today's Visit #: 24 Starting Date: 12/23/21     ASSESSMENT AND PLAN:  Diet: Olivia Burns is currently in the action stage of change. As such, her goal is to continue with weight loss efforts and has agreed to the Category 2 Plan.   Exercise:  Older adults should follow the adult guidelines. When older adults cannot meet the adult guidelines, they should be as physically active as their abilities and conditions will allow. and Older adults should determine their level of effort for physical activity relative to their level of fitness.  Behavior Modification:  We discussed the following Behavioral Modification Strategies today: increasing lean protein intake, decreasing simple carbohydrates, increasing vegetables, increase H2O intake, increase high fiber foods, meal planning and cooking strategies, emotional eating strategies , avoiding  temptations, and planning for success. We discussed various medication options to help Olivia Burns with her weight loss efforts and we both agreed to decreased wellbutrin to once daily from twice daily and continue to work on nutritional and behavioral strategies to promote weight loss.  .  Return in about 4 weeks (around 09/14/2023).Marland Kitchen She was informed of the importance of frequent follow up visits to maximize her success with intensive lifestyle modifications for her multiple health conditions.  Attestation Statements:   Reviewed by clinician on day of visit: allergies, medications, problem list, medical history, surgical history, family history, social history, and previous encounter notes.   Time spent on visit including pre-visit chart review and post-visit care and charting was 25 minutes  Olivia Burgner,PA-C

## 2023-08-24 DIAGNOSIS — F339 Major depressive disorder, recurrent, unspecified: Secondary | ICD-10-CM | POA: Diagnosis not present

## 2023-08-31 DIAGNOSIS — F331 Major depressive disorder, recurrent, moderate: Secondary | ICD-10-CM | POA: Diagnosis not present

## 2023-08-31 DIAGNOSIS — F339 Major depressive disorder, recurrent, unspecified: Secondary | ICD-10-CM | POA: Diagnosis not present

## 2023-09-15 NOTE — Progress Notes (Unsigned)
 SUBJECTIVE: Discussed the use of AI scribe software for clinical note transcription with the patient, who gave verbal consent to proceed.  Chief Complaint: Obesity  Interim History: She is down 1 lb since last visit.  Down 24 lb overall TBW  loss of 10.8% Olivia Burns is here to discuss her progress with her obesity treatment plan. She is on the Category 2 Plan and states she is following her eating plan approximately 80 % of the time. She states she is exercising walking her dog Olivia Burns 20 minutes 2-3 times daily times per week. The patient is a 73 year old with obesity who presents for follow-up of her obesity treatment plan.  She has lost one pound since her last visit and is down twenty-four pounds overall. Her medical history includes prediabetes, hypertension, osteoarthritis, and depressed mood with emotional eating.  She experiences intermittent left knee pain, described as a cramp or knot on the back of her leg, which began after a long walk. The pain has persisted intermittently, and she took Tylenol today for relief. She has previously used Voltaren gel but disliked the sensation it caused. She has not tried [employer], which was suggested by a friend.  She is currently taking bupropion 200 mg once a day without issues. Her pharmacy has been automatically refilling it, which she plans to address.  She walks her dog, Olivia Burns, two to three times a day for about twenty minutes each time. She follows her category two diet plan with 65-80% adherence, occasionally indulging in sweets and chips but opting for healthier snacks like popcorn.  OBJECTIVE: Visit Diagnoses: Problem List Items Addressed This Visit     Essential hypertension - Primary   Prediabetes   Depression   Obesity with starting BMI of 42.1   BMI 37.0-37.9, adult   Other Visit Diagnoses       Primary osteoarthritis of both knees         Obesity Olivia Burns, a 73 year old female, has lost 1 pound since her last visit,  totaling a 24-pound weight loss, and is maintaining her weight under 200 pounds. The Right Start program, a 9-week fitness initiative at [facility], was discussed. It is suitable for individuals with mobility issues and may help maintain activity levels while managing knee pain from osteoarthritis. Several participants have benefited, with some joining the gym afterward. - Encourage participation in the Right Start program at [employer]. - Provide [facility] contact information for program enrollment. - Continue current weight management strategies.  Osteoarthritis Olivia Burns's osteoarthritis primarily affects her knees. A knee injection in February or March initially provided relief. Recently, she experienced a cramp-like sensation in her left knee, causing difficulty walking. She uses acetaminophen for pain relief but dislikes the sensation caused by diclofenac gel. Blue Emu was suggested as an alternative topical treatment, which may provide similar relief without the unpleasant sensation. - Try Blue Emu for knee pain management. - Continue using acetaminophen as needed for pain relief.  Depressed Mood with Emotional Eating Olivia Burns is on bupropion 200 mg once daily, which she reports as effective. She is aware of her emotional eating triggers, particularly sugar and salt, and manages them with healthier alternatives like Olivia Burns yogurt bars and popcorn. She follows the category two plan with varying adherence, achieving 80% on some days and 65-70% on others. - Continue bupropion 200 mg once daily. - Encourage use of healthier snack alternatives like Olivia Burns yogurt bars and popcorn.  General Health Maintenance Olivia Burns is engaged in [employer]  and physical activity, walking her dog twice daily for 20 minutes. She is mindful of her diet, opting for healthier snack options. Strategies for managing cravings and maintaining activity levels despite knee pain were discussed. She is encouraged to adjust  physical activity based on knee pain and to continue promoting healthy eating habits. - Encourage continued physical activity, adjusting for knee pain as needed. - Promote healthy eating habits and mindful snacking.  Vitals Temp: 98.1 F (36.7 C) BP: 103/64 Pulse Rate: 74 SpO2: 99 %   Anthropometric Measurements Height: 5\' 1"  (1.549 m) Weight: 199 lb (90.3 kg) BMI (Calculated): 37.62 Weight at Last Visit: 200 lb Weight Lost Since Last Visit: 1 lb Weight Gained Since Last Visit: 0 Starting Weight: 223 lb Total Weight Loss (lbs): 24 lb (10.9 kg) Peak Weight: 238 lb   Body Composition  Body Fat %: 46.9 % Fat Mass (lbs): 93.6 lbs Muscle Mass (lbs): 100.6 lbs Total Body Water (lbs): 69.8 lbs Visceral Fat Rating : 16   Other Clinical Data Fasting: no Labs: no Today's Visit #: 25 Starting Date: 12/24/21     ASSESSMENT AND PLAN:  Diet: Olivia Burns is currently in the action stage of change. As such, her goal is to continue with weight loss efforts. She has agreed to Category 2 Plan.  Exercise: Olivia Burns has been instructed to work up to a goal of 150 minutes of combined cardio and strengthening exercise per week for weight loss and overall health benefits.   Behavior Modification:  We discussed the following Behavioral Modification Strategies today: increasing lean protein intake, decreasing simple carbohydrates, increasing vegetables, increase H2O intake, no skipping meals, meal planning and cooking strategies, emotional eating strategies , avoiding temptations, and planning for success. We discussed various medication options to help Olivia Burns with her weight loss efforts and we both agreed to continue to work on nutritional and behavioral strategies to promote weight loss.  .  No follow-ups on file.Olivia Burns She was informed of the importance of frequent follow up visits to maximize her success with intensive lifestyle modifications for her multiple health conditions.  Attestation  Statements:   Reviewed by clinician on day of visit: allergies, medications, problem list, medical history, surgical history, family history, social history, and previous encounter notes.   Time spent on visit including pre-visit chart review and post-visit care and charting was 20 minutes.    Olivia Fredrick, PA-C

## 2023-09-16 ENCOUNTER — Encounter (INDEPENDENT_AMBULATORY_CARE_PROVIDER_SITE_OTHER): Payer: Self-pay | Admitting: Physician Assistant

## 2023-09-16 ENCOUNTER — Ambulatory Visit (INDEPENDENT_AMBULATORY_CARE_PROVIDER_SITE_OTHER): Admitting: Physician Assistant

## 2023-09-16 VITALS — BP 103/64 | HR 74 | Temp 98.1°F | Ht 61.0 in | Wt 199.0 lb

## 2023-09-16 DIAGNOSIS — M170 Bilateral primary osteoarthritis of knee: Secondary | ICD-10-CM | POA: Diagnosis not present

## 2023-09-16 DIAGNOSIS — R7303 Prediabetes: Secondary | ICD-10-CM

## 2023-09-16 DIAGNOSIS — E669 Obesity, unspecified: Secondary | ICD-10-CM | POA: Diagnosis not present

## 2023-09-16 DIAGNOSIS — Z6837 Body mass index (BMI) 37.0-37.9, adult: Secondary | ICD-10-CM | POA: Diagnosis not present

## 2023-09-16 DIAGNOSIS — F3289 Other specified depressive episodes: Secondary | ICD-10-CM

## 2023-09-16 DIAGNOSIS — I10 Essential (primary) hypertension: Secondary | ICD-10-CM

## 2023-09-16 DIAGNOSIS — F5089 Other specified eating disorder: Secondary | ICD-10-CM | POA: Diagnosis not present

## 2023-09-16 DIAGNOSIS — F32A Depression, unspecified: Secondary | ICD-10-CM

## 2023-09-22 DIAGNOSIS — F331 Major depressive disorder, recurrent, moderate: Secondary | ICD-10-CM | POA: Diagnosis not present

## 2023-09-26 ENCOUNTER — Other Ambulatory Visit: Payer: Self-pay | Admitting: Family Medicine

## 2023-10-02 DIAGNOSIS — F339 Major depressive disorder, recurrent, unspecified: Secondary | ICD-10-CM | POA: Diagnosis not present

## 2023-10-02 DIAGNOSIS — F5081 Binge eating disorder, mild: Secondary | ICD-10-CM | POA: Diagnosis not present

## 2023-10-02 DIAGNOSIS — F331 Major depressive disorder, recurrent, moderate: Secondary | ICD-10-CM | POA: Diagnosis not present

## 2023-10-12 ENCOUNTER — Ambulatory Visit (INDEPENDENT_AMBULATORY_CARE_PROVIDER_SITE_OTHER): Payer: Medicare Other | Admitting: Family Medicine

## 2023-10-12 ENCOUNTER — Encounter: Payer: Self-pay | Admitting: Family Medicine

## 2023-10-12 VITALS — BP 126/78 | HR 78 | Temp 98.1°F | Resp 18 | Ht 61.0 in | Wt 204.4 lb

## 2023-10-12 DIAGNOSIS — I10 Essential (primary) hypertension: Secondary | ICD-10-CM | POA: Diagnosis not present

## 2023-10-12 DIAGNOSIS — R7303 Prediabetes: Secondary | ICD-10-CM

## 2023-10-12 DIAGNOSIS — E785 Hyperlipidemia, unspecified: Secondary | ICD-10-CM | POA: Diagnosis not present

## 2023-10-12 LAB — COMPREHENSIVE METABOLIC PANEL WITH GFR
ALT: 19 U/L (ref 0–35)
AST: 20 U/L (ref 0–37)
Albumin: 4.3 g/dL (ref 3.5–5.2)
Alkaline Phosphatase: 79 U/L (ref 39–117)
BUN: 17 mg/dL (ref 6–23)
CO2: 28 meq/L (ref 19–32)
Calcium: 9.5 mg/dL (ref 8.4–10.5)
Chloride: 102 meq/L (ref 96–112)
Creatinine, Ser: 0.98 mg/dL (ref 0.40–1.20)
GFR: 57.27 mL/min — ABNORMAL LOW (ref >60.00)
Glucose, Bld: 92 mg/dL (ref 70–99)
Potassium: 4.3 meq/L (ref 3.5–5.1)
Sodium: 139 meq/L (ref 135–145)
Total Bilirubin: 0.4 mg/dL (ref 0.2–1.2)
Total Protein: 6.8 g/dL (ref 6.0–8.3)

## 2023-10-12 LAB — LIPID PANEL
Cholesterol: 203 mg/dL — ABNORMAL HIGH (ref 0–200)
HDL: 71.6 mg/dL (ref >39.00)
LDL Cholesterol: 119 mg/dL — ABNORMAL HIGH (ref 0–99)
NonHDL: 131.03
Total CHOL/HDL Ratio: 3
Triglycerides: 62 mg/dL (ref 0.0–149.0)
VLDL: 12.4 mg/dL (ref 0.0–40.0)

## 2023-10-12 LAB — HEMOGLOBIN A1C: Hgb A1c MFr Bld: 6 % (ref 4.6–6.5)

## 2023-10-12 NOTE — Progress Notes (Signed)
 Chief Complaint  Patient presents with   Follow-up    Subjective Olivia Burns is a 73 y.o. female who presents for hypertension follow up. She does not monitor home blood pressures. She is compliant with medications- valsartan  hydrochlorothiazide  80-12.5 mg/d. Patient has these side effects of medication: none She is sometimes adhering to a healthy diet overall. Current exercise: walking, cycling No CP or SOB.   Hyperlipidemia Patient presents for dyslipidemia follow up. Currently being treated with Crestor  5 mg/d and compliance with treatment thus far has been good. She denies myalgias. Diet/exercise as above.  The patient is not known to have coexisting coronary artery disease.   Past Medical History:  Diagnosis Date   Allergy    Anemia    Anxiety    Arthritis    Asthma    Constipation    Depression    GERD (gastroesophageal reflux disease)    Hyperlipidemia    Hypertension    Osteoarthritis    Pre-diabetes    Sleep apnea     Exam BP (!) 142/72 (BP Location: Left Arm, Patient Position: Sitting, Cuff Size: Large)   Pulse 78   Temp 98.1 F (36.7 C) (Oral)   Resp 18   Ht 5\' 1"  (1.549 m)   Wt 204 lb 6.4 oz (92.7 kg)   SpO2 95%   BMI 38.62 kg/m  General:  well developed, well nourished, in no apparent distress Heart: RRR, no LE edema Lungs: clear to auscultation, no accessory muscle use Psych: well oriented with normal range of affect and appropriate judgment/insight  Essential hypertension  Hyperlipidemia, unspecified hyperlipidemia type - Plan: Comprehensive metabolic panel with GFR, Lipid panel  Prediabetes - Plan: Hemoglobin A1c  Chronic, stable. Cont valsartan -hydrochlorothiazide  80-12.5 mg/d. Counseled on diet and exercise. Chronic, stable. Cont Crestor  5 mg/d.  Ck A1c.  F/u in 6 mo. The patient voiced understanding and agreement to the plan.  [name], DO 10/12/23  9:21 AM

## 2023-10-12 NOTE — Patient Instructions (Signed)
 Give [place] 2-3 business days to get the results of your labs back.   Keep the diet clean and stay active.  Please consider adding some weight resistance exercise to your routine. Consider yoga as well.   Let us know if you need anything.

## 2023-10-15 DIAGNOSIS — M1711 Unilateral primary osteoarthritis, right knee: Secondary | ICD-10-CM | POA: Diagnosis not present

## 2023-10-20 NOTE — Progress Notes (Signed)
 SUBJECTIVE: Discussed the use of AI scribe software for clinical note transcription with the patient, who gave verbal consent to proceed.  Chief Complaint: Obesity  Interim History: She is up 2 lbs since her last visit.  Down 22 lbs overall TBW loss of 9.9%  Olivia Burns is here to discuss her progress with her obesity treatment plan. She is on the Category 2 Plan and states she is following her eating plan approximately 40 % of the time. She states she is exercising walking [name] her dog 20 minutes twice daily 7 times per week.  Olivia Burns is a 73 year old female with obesity who presents for follow-up of her obesity treatment plan.  She is adhering to her category two obesity treatment plan approximately 40% of the time and has gained two pounds since her last visit. She attributes this weight gain to weather changes and difficulty resisting cravings, particularly for sweets like cakes and cookies. She often drives to purchase these items when cravings occur.  Her medical history includes prediabetes, hypertension, hypercholesterolemia, and depression with emotional eating. She is currently taking Wellbutrin  for depression and ?Lunesta for sleep. She reports good sleep quality with ? Lunesta, waking only to use the bathroom and returning to sleep easily.  She engages in physical activity by walking her dog, [employer], for 20 minutes twice a day, seven days a week. However, she notes that her dog is aging and less active, especially in warmer weather.  She has discussed her sugar cravings with her psychiatrist, who suggested a medication possibly naltrexone, to help with cravings. She also mentions a DNA study indicating a genetic marker related to weight loss and cravings. We discussed other options to help with sugar cravings including topamax/topiramate.  Patient denies history of glaucoma or kidney stones. She was informed of side effects and that topiramate is teratogenic. She is post  menopausal.    OBJECTIVE: Visit Diagnoses: Problem List Items Addressed This Visit     Essential hypertension - Primary   Prediabetes   Relevant Medications   topiramate (TOPAMAX) 25 MG tablet   Depression   Obesity with starting BMI of 42.1   Other Visit Diagnoses       Primary osteoarthritis of both knees         BMI 38.0-38.9,adult Current BMI 38.0         Obesity Olivia Burns is adhering to a category two obesity treatment plan approximately 40% of the time. She experiences sugar cravings, particularly for cakes and cookies, contributing to weight gain. She has gained two pounds since her last visit. Emotional eating related to depression is present. The psychiatrist suggested naltrexone for cravings, but due to potential side effects like nausea, topiramate is considered a better option. - Start topiramate 25 mg, take half a tablet at dinner daily for the first week. If well tolerated, increase to one tablet daily at dinner. Monitor for fatigue and adjust dosage if necessary. - Encourage increased water intake to at least 80 ounces daily, especially during warmer months. - Advise avoiding high-calorie, high-sugar foods such as cakes and cookies.  Prediabetes with cravings A1c has improved from 6.2 to 6.0, indicating better glucose control. Sugar cravings are a concern, and topiramate is being considered to help manage these cravings and support weight management. Lab Results  Component Value Date   HGBA1C 6.0 10/12/2023   HGBA1C 6.2 04/13/2023   HGBA1C 6.3 10/10/2022   Lab Results  Component Value Date   LDLCALC 119 (  H) 10/12/2023   CREATININE 0.98 10/12/2023   A1c is improved.  - Start topiramate as outlined under obesity to help with cravings and support weight management. - Encourage dietary modifications to reduce sugar intake. -Continue working on nutrition plan to decrease simple carbohydrates, increase lean proteins and exercise to promote weight loss, improve  glycemic control and prevent progression to Type 2 diabetes.   Depression with emotional eating Depression is associated with emotional eating and sugar cravings. Wellbutrin  is currently being used, which may help with cravings. The psychiatrist suggested naltrexone, but topiramate is preferred due to fewer side effects. - Continue current antidepressant therapy with Wellbutrin . No refill needed this visit. - Start topiramate as outlined under obesity to help with cravings. Meds ordered this encounter  Medications   topiramate (TOPAMAX) 25 MG tablet    Sig: Take 1 tablet (25 mg total) by mouth daily at 8 pm. Take 1/2 tablet at dinner time daily for the first wk and if doing well, increase to 1 tab daily at dinner    Dispense:  30 tablet    Refill:  1    Hypertension Blood pressure control is crucial to maintain kidney function and prevent further decline in glomerular filtration rate. Current blood pressure is slightly better than the last visit. Adequate hydration is emphasized to support kidney function. BP Readings from Last 3 Encounters:  10/21/23 128/73  10/12/23 126/78  09/16/23 103/64   Lab Results  Component Value Date   NA 139 10/12/2023   CL 102 10/12/2023   K 4.3 10/12/2023   CO2 28 10/12/2023   BUN 17 10/12/2023   CREATININE 0.98 10/12/2023   GFR 57.27 (L) 10/12/2023   CALCIUM  9.5 10/12/2023   ALBUMIN 4.3 10/12/2023   GLUCOSE 92 10/12/2023   Continue to work on nutrition plan to promote weight loss and improve BP control.  - Encourage increased water intake to at least 80 ounces daily. - Monitor blood pressure regularly to ensure it remains under control.  Hypercholesterolemia Total cholesterol is well-managed, with HDL levels improved, likely due to increased physical activity. LDL cholesterol remains slightly elevated. Avoidance of saturated and trans fats is advised to improve cholesterol levels. Lab Results  Component Value Date   CHOL 203 (H) 10/12/2023    CHOL 206 (H) 04/13/2023   CHOL 178 11/21/2022   Lab Results  Component Value Date   HDL 71.60 10/12/2023   HDL 62.80 04/13/2023   HDL 50.60 11/21/2022   Lab Results  Component Value Date   LDLCALC 119 (H) 10/12/2023   LDLCALC 128 (H) 04/13/2023   LDLCALC 106 (H) 11/21/2022   Lab Results  Component Value Date   TRIG 62.0 10/12/2023   TRIG 76.0 04/13/2023   TRIG 107.0 11/21/2022   Lab Results  Component Value Date   CHOLHDL 3 10/12/2023   CHOLHDL 3 04/13/2023   CHOLHDL 4 11/21/2022   No results found for: "LDLDIRECT" Continue to work on nutrition plan -decreasing simple carbohydrates, increasing lean proteins, decreasing saturated fats and cholesterol , avoiding trans fats and exercise as able to promote weight loss, improve lipids and decrease cardiovascular risks. - Advise avoiding foods high in saturated and trans fats, such as pre-made baked goods. - Encourage continued physical activity, such as walking.  Osteoarthritis of knees She received a knee injection approximately one to two weeks ago, which has provided some relief.  She is considering knee surgery - TKA, but is concerned about the recovery period and its impact on her responsibilities,  such as caring for her dog.  Vitals Temp: 98.1 F (36.7 C) BP: 128/73 Pulse Rate: 65 SpO2: 99 %   Anthropometric Measurements Height: 5\' 1"  (1.549 m) Weight: 201 lb (91.2 kg) BMI (Calculated): 38 Weight at Last Visit: 199 lb Weight Lost Since Last Visit: 0 Weight Gained Since Last Visit: 2 lb Starting Weight: 223 lb Total Weight Loss (lbs): 22 lb (9.979 kg) Peak Weight: 238 lb   Body Composition  Body Fat %: 47.9 % Fat Mass (lbs): 96.4 lbs Muscle Mass (lbs): 99.4 lbs Total Body Water (lbs): 70.2 lbs Visceral Fat Rating : 16   Other Clinical Data Fasting: No Labs: No Today's Visit #: 26 Starting Date: 12/24/21     ASSESSMENT AND PLAN:  Diet: Olivia Burns is currently in the action stage of change. As  such, her goal is to continue with weight loss efforts. She has agreed to Category 2 Plan.  Exercise: Olivia Burns has been instructed to try a geriatric exercise plan and that some exercise is better than none for weight loss and overall health benefits.   Behavior Modification:  We discussed the following Behavioral Modification Strategies today: increasing lean protein intake, decreasing simple carbohydrates, increasing vegetables, increase H2O intake, increase high fiber foods, no skipping meals, meal planning and cooking strategies, emotional eating strategies , avoiding temptations, and planning for success. We discussed various medication options to help Olivia Burns with her weight loss efforts and we both agreed to start very low dose topiramate and titrate slowly for effect and continue to work on nutritional and behavioral strategies to promote weight loss.  .  Return in about 7 weeks (around 12/09/2023).Olivia Burns She was informed of the importance of frequent follow up visits to maximize her success with intensive lifestyle modifications for her multiple health conditions.  Attestation Statements:   Reviewed by clinician on day of visit: allergies, medications, problem list, medical history, surgical history, family history, social history, and previous encounter notes.   Time spent on visit including pre-visit chart review and post-visit care and charting was 36 minutes.    Olivia Bosserman, PA-C

## 2023-10-21 ENCOUNTER — Ambulatory Visit (INDEPENDENT_AMBULATORY_CARE_PROVIDER_SITE_OTHER): Admitting: Physician Assistant

## 2023-10-21 ENCOUNTER — Encounter (INDEPENDENT_AMBULATORY_CARE_PROVIDER_SITE_OTHER): Payer: Self-pay | Admitting: Physician Assistant

## 2023-10-21 VITALS — BP 128/73 | HR 65 | Temp 98.1°F | Ht 61.0 in | Wt 201.0 lb

## 2023-10-21 DIAGNOSIS — F5089 Other specified eating disorder: Secondary | ICD-10-CM | POA: Diagnosis not present

## 2023-10-21 DIAGNOSIS — F3289 Other specified depressive episodes: Secondary | ICD-10-CM

## 2023-10-21 DIAGNOSIS — R7303 Prediabetes: Secondary | ICD-10-CM | POA: Diagnosis not present

## 2023-10-21 DIAGNOSIS — E669 Obesity, unspecified: Secondary | ICD-10-CM | POA: Diagnosis not present

## 2023-10-21 DIAGNOSIS — F32A Depression, unspecified: Secondary | ICD-10-CM | POA: Diagnosis not present

## 2023-10-21 DIAGNOSIS — M170 Bilateral primary osteoarthritis of knee: Secondary | ICD-10-CM | POA: Diagnosis not present

## 2023-10-21 DIAGNOSIS — Z6838 Body mass index (BMI) 38.0-38.9, adult: Secondary | ICD-10-CM

## 2023-10-21 DIAGNOSIS — I10 Essential (primary) hypertension: Secondary | ICD-10-CM

## 2023-10-21 MED ORDER — TOPIRAMATE 25 MG PO TABS: 25.0000 mg | ORAL_TABLET | Freq: Every day | ORAL | 1 refills | Status: DC

## 2023-10-23 DIAGNOSIS — F331 Major depressive disorder, recurrent, moderate: Secondary | ICD-10-CM | POA: Diagnosis not present

## 2023-10-23 DIAGNOSIS — F5081 Binge eating disorder, mild: Secondary | ICD-10-CM | POA: Diagnosis not present

## 2023-12-02 DIAGNOSIS — F339 Major depressive disorder, recurrent, unspecified: Secondary | ICD-10-CM | POA: Diagnosis not present

## 2023-12-02 DIAGNOSIS — F5081 Binge eating disorder, mild: Secondary | ICD-10-CM | POA: Diagnosis not present

## 2023-12-02 DIAGNOSIS — F331 Major depressive disorder, recurrent, moderate: Secondary | ICD-10-CM | POA: Diagnosis not present

## 2023-12-07 NOTE — Progress Notes (Unsigned)
 SUBJECTIVE: Discussed the use of AI scribe software for clinical note transcription with the patient, who gave verbal consent to proceed.  Chief Complaint: Obesity  Interim History: She is down 1 lb since last visit.  Down 23 lbs overall TBW loss of 10.3%  Alysandra is here to discuss her progress with her obesity treatment plan. She is on the Category 2 Plan and states she is following her eating plan approximately 75 % of the time. She states she is exercising walking/bike 10-20/10 minutes 3 times per week.  Zephyr Sausedo is a 73 year old female with obesity who presents for follow-up of her obesity treatment plan.  She has lost one pound since her last visit and a total of twenty-three pounds overall. She experiences difficulty adhering to her dietary program, often not consuming all the recommended food, particularly protein, which leads to increased hunger and temptation to eat non-plan foods. She incorporates [name] yogurt with wild blueberries and protein cereal for breakfast and sometimes struggles with getting enough protein during lunch and dinner. She uses [employer]ik[employer] protein shots to supplement her protein intake.  She has a history of prediabetes and is mindful of her dietary intake to manage her condition. She focuses on increasing her protein intake to prevent hunger and maintain her weight loss progress.  She experiences depression, which is associated with emotional eating. She is currently taking bupropion  without significant side effects, although she notes some tremor in her left hand when picking up objects. She is also taking Topamax  and trazodone to aid with sleep, which she takes around 4 PM to ensure it does not interfere with her sleep schedule.  She has bilateral knee osteoarthritis and reports that her knees are doing well currently, without the need for a cane. She received injections over two months ago, which have been effective. She is considering using a compression  sleeve for additional support during prolonged activities. She practices leg lifts to strengthen her quadriceps and manages her activity level to prevent knee pain.  In terms of social history, she has stopped drinking coffee and switched to black tea, which she finds more palatable and does not require additives. She is working on increasing her fluid intake and is mindful of her physical activity, aiming to incorporate more exercise into her routine despite challenges with knee pain.  Pharmacotherapy: Wellbutrin  SR 200 mg daily- Emotional eating, depression           Topiramate  25 mg daily- Prediabetes with cravings LABS 10/12/23  Exercise: [employer] program [employer]  OBJECTIVE: Visit Diagnoses: Problem List Items Addressed This Visit     Prediabetes - Primary   Depression   Relevant Medications   buPROPion  (WELLBUTRIN  SR) 200 MG 12 hr tablet   Obesity with starting BMI of 42.1   BMI 37.0-37.9, adult   Other Visit Diagnoses       Primary osteoarthritis of both knees         Obesity Shyna Duignan, a 73 year old female, presents with obesity, prediabetes, hypertension, hyperlipidemia, depression with emotional eating, and bilateral knee osteoarthritis. She has lost 23 pounds, including one pound since her last visit. Her visceral adipose rating improved from 18 to 15, and her muscle mass increased from 99.4 pounds to 103.4 pounds, while adipose mass decreased from 96.4 to 91.6 pounds. She is not strictly adhering to the dietary program, particularly in protein intake, which may lead to cravings for non-program foods. - Encourage adherence to dietary program with focus on  adequate protein intake - Suggest use of [employer]ik[employer] protein shots to augment protein intake - Advise on mindful eating and tracking protein consumption   Prediabetes with cravings Last A1c was 6.0- improved but not at goal.   Medication(s): Topiramate  25 mg daily No SE with topiramate . Feels it is helping with  cravings and helping with sleep Polyphagia:No Lab Results  Component Value Date   HGBA1C 6.0 10/12/2023   HGBA1C 6.2 04/13/2023   HGBA1C 6.3 10/10/2022   HGBA1C 6.1 (H) 12/24/2021   HGBA1C 6.2 07/09/2021   Lab Results  Component Value Date   INSULIN  10.8 12/24/2021    Plan: Continue Topiramate  25 mg nightly No refill needed this visit.  Continue working on nutrition plan to decrease simple carbohydrates, increase lean proteins and exercise to promote weight loss, improve glycemic control and prevent progression to Type 2 diabetes.   Depression with Emotional Eating Lailanie is on bupropion  and Topamax  for depression and emotional eating. She reports no significant side effects from bupropion , though she experiences hand tremors. Topamax  aids sleep, taken with trazodone in the evening. She prefers to continue bupropion  despite tremors due to its benefits. - Continue bupropion  and monitor for hand tremors - Continue Topamax  and trazodone for sleep - Refill bupropion  prescription at [facility] ordered this encounter  Medications   buPROPion  (WELLBUTRIN  SR) 200 MG 12 hr tablet    Sig: Take 1 tablet (200 mg total) by mouth daily.    Dispense:  30 tablet    Refill:  2    Bilateral Knee Osteoarthritis Deasiah reports her knees are currently well-managed, with no need for a cane. She received injections over two months ago and is considering supports for her knees as she does experience more pain when she has to be weight bearing for longer periods. She experiences pain after prolonged activity and is considering compression sleeves for support. Strengthening exercises for quadriceps are recommended to improve knee stability. She is advised to consult with her orthopedic doctor for additional strengthening exercises. - Consider compression sleeves for knee support - Recommend quadriceps strengthening exercises - Discuss with orthopedic doctor about additional strengthening  exercises  General Health Maintenance Leighann has switched from coffee to black tea, which she finds more palatable and beneficial. She is working on increasing fluid intake and maintaining hydration. Black tea provides some caffeine and has health benefits, including antioxidants. - Encourage adequate fluid intake - Support switch to black tea for caffeine and health benefits  Vitals Temp: 98.2 F (36.8 C) BP: 107/67 Pulse Rate: 70 SpO2: 99 %   Anthropometric Measurements Height: 5' 1 (1.549 m) Weight: 200 lb (90.7 kg) BMI (Calculated): 37.81 Weight at Last Visit: 201 lb Weight Lost Since Last Visit: 1 lb Weight Gained Since Last Visit: 0 Starting Weight: 223 lb Total Weight Loss (lbs): 23 lb (10.4 kg)   Body Composition  Body Fat %: 45.7 % Fat Mass (lbs): 91.6 lbs Muscle Mass (lbs): 103.4 lbs Total Body Water (lbs): 73 lbs Visceral Fat Rating : 15   No data recorded   ASSESSMENT AND PLAN:  Diet: Kady is currently in the action stage of change. As such, her goal is to continue with weight loss efforts. She has agreed to Category 2 Plan.  Exercise: Bryttney has been instructed to continue exercising as is and discuss other safe exercises with orthopedist for weight loss and overall health benefits.   Behavior Modification:  We discussed the following Behavioral Modification Strategies today: increasing lean protein  intake, decreasing simple carbohydrates, increasing vegetables, increase H2O intake, increase high fiber foods, no skipping meals, meal planning and cooking strategies, emotional eating strategies , avoiding temptations, and planning for success. We discussed various medication options to help [facility] with her weight loss efforts and we both agreed to continue current treatment plan.  Return in about 4 weeks (around 01/05/2024).SABRA She was informed of the importance of frequent follow up visits to maximize her success with intensive lifestyle modifications for  her multiple health conditions.  Attestation Statements:   Reviewed by clinician on day of visit: allergies, medications, problem list, medical history, surgical history, family history, social history, and previous encounter notes.   Time spent on visit including pre-visit chart review and post-visit care and charting was 32 minutes.    Eleana Tocco, PA-C

## 2023-12-08 ENCOUNTER — Ambulatory Visit (INDEPENDENT_AMBULATORY_CARE_PROVIDER_SITE_OTHER): Admitting: Physician Assistant

## 2023-12-08 ENCOUNTER — Encounter (INDEPENDENT_AMBULATORY_CARE_PROVIDER_SITE_OTHER): Payer: Self-pay | Admitting: Physician Assistant

## 2023-12-08 VITALS — BP 107/67 | HR 70 | Temp 98.2°F | Ht 61.0 in | Wt 200.0 lb

## 2023-12-08 DIAGNOSIS — F5089 Other specified eating disorder: Secondary | ICD-10-CM

## 2023-12-08 DIAGNOSIS — F32A Depression, unspecified: Secondary | ICD-10-CM | POA: Diagnosis not present

## 2023-12-08 DIAGNOSIS — R7303 Prediabetes: Secondary | ICD-10-CM

## 2023-12-08 DIAGNOSIS — I10 Essential (primary) hypertension: Secondary | ICD-10-CM

## 2023-12-08 DIAGNOSIS — M170 Bilateral primary osteoarthritis of knee: Secondary | ICD-10-CM

## 2023-12-08 DIAGNOSIS — E669 Obesity, unspecified: Secondary | ICD-10-CM | POA: Diagnosis not present

## 2023-12-08 DIAGNOSIS — Z6837 Body mass index (BMI) 37.0-37.9, adult: Secondary | ICD-10-CM | POA: Diagnosis not present

## 2023-12-08 DIAGNOSIS — F3289 Other specified depressive episodes: Secondary | ICD-10-CM

## 2023-12-08 DIAGNOSIS — E785 Hyperlipidemia, unspecified: Secondary | ICD-10-CM

## 2023-12-08 MED ORDER — BUPROPION HCL ER (SR) 200 MG PO TB12: 200.0000 mg | ORAL_TABLET | Freq: Every day | ORAL | 2 refills | Status: DC

## 2023-12-10 DIAGNOSIS — M170 Bilateral primary osteoarthritis of knee: Secondary | ICD-10-CM | POA: Diagnosis not present

## 2023-12-14 DIAGNOSIS — F331 Major depressive disorder, recurrent, moderate: Secondary | ICD-10-CM | POA: Diagnosis not present

## 2023-12-16 ENCOUNTER — Other Ambulatory Visit (INDEPENDENT_AMBULATORY_CARE_PROVIDER_SITE_OTHER): Payer: Self-pay | Admitting: Physician Assistant

## 2023-12-16 DIAGNOSIS — R7303 Prediabetes: Secondary | ICD-10-CM

## 2024-01-01 ENCOUNTER — Other Ambulatory Visit (INDEPENDENT_AMBULATORY_CARE_PROVIDER_SITE_OTHER): Payer: Self-pay | Admitting: Physician Assistant

## 2024-01-01 DIAGNOSIS — F3289 Other specified depressive episodes: Secondary | ICD-10-CM

## 2024-01-04 NOTE — Progress Notes (Unsigned)
 SUBJECTIVE: Discussed the use of AI scribe software for clinical note transcription with the patient, who gave verbal consent to proceed.  Chief Complaint: Obesity  Interim History: She is down 2 lbs since her last visit.  Down 25 lbs overall TBW loss of 11.2%  Shauntavia is here to discuss her progress with her obesity treatment plan. She is on the Category 2 Plan and states she is following her eating plan approximately 50 % of the time. She states she is exercising walking 20-30 minutes 7 times per week.  Dajah Fischman is a 73 year old female who presents for follow-up of her obesity treatment plan.  She has lost 25 pounds, equating to 11.2% of her body weight, but feels 'off the rails' with her diet. She maintains protein intake but has inconsistent hydration, drinking more on hot days and less on cooler days, often forgetting to drink water if it's not nearby.  She has a history of prediabetes and is on topiramate  25 mg daily at 4 PM to manage cravings, which is effective when taken consistently. She also takes bupropion  200 mg daily for depression with emotional eating.  Hypertension is managed with valsartan /hydrochlorothiazide  80/12.5 mg daily, and hyperlipidemia is treated with Crestor  5 mg daily. She also takes coenzyme Q10 30 mg three times daily and B complex vitamins.  She has osteoarthritis in both knees and reports that her knees act up and feel achy, especially during rainy weather. She uses Bengay for pain relief and wears a knee sleeve, avoiding Voltaren gel due to adverse effects. She engages in light physical activities like walking her dog and exercising during TV commercials.  She experiences fatigue, headaches, and increased joint pain when dehydrated. Her sleep is sometimes disrupted by anxiety about oversleeping for appointments, but she generally maintains a consistent sleep schedule. She engages in activities like watching TV, playing games, and crocheting to manage  her time and distract from cravings. OBJECTIVE: Visit Diagnoses: Problem List Items Addressed This Visit     Essential hypertension   Prediabetes - Primary   Relevant Medications   topiramate  (TOPAMAX ) 25 MG tablet   Depression   Relevant Medications   buPROPion  (WELLBUTRIN  SR) 200 MG 12 hr tablet   Obesity with starting BMI of 42.1   BMI 37.0-37.9, adult   Other Visit Diagnoses       Primary osteoarthritis of both knees         Obesity Kionna has lost 25 pounds, which is 11.2% of her body weight. She reports feeling off track with her diet but acknowledges the importance of protein intake and maintaining good hydration.  - Encourage increased physical activity, such as moving for three minutes every hour. - Advise on strategies to increase hydration, such as setting reminders and drinking consistently throughout the day.  Bilateral primary osteoarthritis of knee Erin reports knee pain, exacerbated by weather changes/rainy weather. She uses Bengay and a knee sleeve for relief. - Encourage hydration to help with joint pain. - Advise on increasing mobility through exercises and regular movement.  Prediabetes with cravings Codie is on topiramate  25 mg daily at 4 PM, which helps with cravings when taken consistently. She acknowledges thinking about cravings but finds they pass with time. - Continue topiramate  25 mg daily at 4 PM. - Refill topiramate  prescription. - Think about strategies for EEB, like crocheting or other enjoyable activities.  Meds ordered this encounter  Medications   buPROPion  (WELLBUTRIN  SR) 200 MG 12 hr tablet  Sig: Take 1 tablet (200 mg total) by mouth daily.    Dispense:  30 tablet    Refill:  2   topiramate  (TOPAMAX ) 25 MG tablet    Sig: Take 1 tablet (25 mg total) by mouth daily at 8 pm. Take 1 tablet daily at 4 pm    Dispense:  30 tablet    Refill:  1    Hypertension Nohemi is on valsartan /hydrochlorothiazide  80/12.5 mg daily for blood  pressure management. Reports no SE, but does not always feel well hydrated.  Renal function stable but slightly down from last year. BP Readings from Last 3 Encounters:  01/05/24 112/68  12/08/23 107/67  10/21/23 128/73   Lab Results  Component Value Date   NA 139 10/12/2023   CL 102 10/12/2023   K 4.3 10/12/2023   CO2 28 10/12/2023   BUN 17 10/12/2023   CREATININE 0.98 10/12/2023   GFR 57.27 (L) 10/12/2023   CALCIUM  9.5 10/12/2023   ALBUMIN 4.3 10/12/2023   GLUCOSE 92 10/12/2023   Plan Continue valsartan /hydrochlorothiazide  80/12.5 mg daily Recheck labs over the next 1-2 months Continue to work on nutrition plan to promote weight loss and improve BP control.    Hyperlipidemia Kristyna is on Crestor  5 mg daily for hyperlipidemia. Reports no SE.  Last lipids Lab Results  Component Value Date   CHOL 203 (H) 10/12/2023   HDL 71.60 10/12/2023   LDLCALC 119 (H) 10/12/2023   TRIG 62.0 10/12/2023   CHOLHDL 3 10/12/2023  LDL is not at goal. HDL is at goal. Trig are at goal. Plan Continue Crestor  5 mg daily. May want to increase if LDL not improved with next labs over the next couple of months.  Continue to work on nutrition plan -decreasing simple carbohydrates, increasing lean proteins, decreasing saturated fats and cholesterol , avoiding trans fats and exercise as able to promote weight loss, improve lipids and decrease cardiovascular risks.    Depression with emotional eating Sarabella is on bupropion  200 mg daily. She reports no significant issues with the medication and finds the current combination of medications beneficial. No SI/HI or other concerns.  - Continue bupropion  200 mg daily. - Refill bupropion  prescription. - Sees psychiatry for depressive symptoms as well and also on Zoloft currently and reports mood stable.   Vitals Temp: 98.5 F (36.9 C) BP: 112/68 Pulse Rate: 85 SpO2: 97 %   Anthropometric Measurements Height: 5' 1 (1.549 m) Weight: 198 lb (89.8  kg) BMI (Calculated): 37.43 Weight at Last Visit: 200 LB Weight Lost Since Last Visit: 2 lb Weight Gained Since Last Visit: 0 Starting Weight: 223 LB Total Weight Loss (lbs): 25 lb (11.3 kg)   Body Composition  Body Fat %: 46 % Fat Mass (lbs): 91 lbs Muscle Mass (lbs): 101.6 lbs Total Body Water (lbs): 68.2 lbs Visceral Fat Rating : 15   Other Clinical Data Fasting: yes Labs: NO Today's Visit #: 28 Starting Date: 12/24/21     ASSESSMENT AND PLAN:  Diet: Jaiah is currently in the action stage of change. As such, her goal is to continue with weight loss efforts. She has agreed to Category 2 Plan.  Exercise: Domanique has been instructed to try a geriatric exercise plan and that some exercise is better than none for weight loss and overall health benefits.   Behavior Modification:  We discussed the following Behavioral Modification Strategies today: increasing lean protein intake, decreasing simple carbohydrates, increasing vegetables, increase H2O intake, increase high fiber foods, no skipping meals,  meal planning and cooking strategies, emotional eating strategies , avoiding temptations, and planning for success. We discussed various medication options to help [facility] with her weight loss efforts and we both agreed to continue current treatment plan.  Return in about 4 weeks (around 02/02/2024).SABRA She was informed of the importance of frequent follow up visits to maximize her success with intensive lifestyle modifications for her multiple health conditions.  Attestation Statements:   Reviewed by clinician on day of visit: allergies, medications, problem list, medical history, surgical history, family history, social history, and previous encounter notes.   Time spent on visit including pre-visit chart review and post-visit care and charting was 35 minutes.    Joleena Weisenburger, PA-C

## 2024-01-05 ENCOUNTER — Ambulatory Visit (INDEPENDENT_AMBULATORY_CARE_PROVIDER_SITE_OTHER): Admitting: Physician Assistant

## 2024-01-05 VITALS — BP 112/68 | HR 85 | Temp 98.5°F | Ht 61.0 in | Wt 198.0 lb

## 2024-01-05 DIAGNOSIS — F5089 Other specified eating disorder: Secondary | ICD-10-CM | POA: Diagnosis not present

## 2024-01-05 DIAGNOSIS — Z6837 Body mass index (BMI) 37.0-37.9, adult: Secondary | ICD-10-CM | POA: Diagnosis not present

## 2024-01-05 DIAGNOSIS — E785 Hyperlipidemia, unspecified: Secondary | ICD-10-CM

## 2024-01-05 DIAGNOSIS — F3289 Other specified depressive episodes: Secondary | ICD-10-CM

## 2024-01-05 DIAGNOSIS — I10 Essential (primary) hypertension: Secondary | ICD-10-CM | POA: Diagnosis not present

## 2024-01-05 DIAGNOSIS — R7303 Prediabetes: Secondary | ICD-10-CM | POA: Diagnosis not present

## 2024-01-05 DIAGNOSIS — M170 Bilateral primary osteoarthritis of knee: Secondary | ICD-10-CM

## 2024-01-05 DIAGNOSIS — F32A Depression, unspecified: Secondary | ICD-10-CM | POA: Diagnosis not present

## 2024-01-05 DIAGNOSIS — E669 Obesity, unspecified: Secondary | ICD-10-CM | POA: Diagnosis not present

## 2024-01-05 MED ORDER — BUPROPION HCL ER (SR) 200 MG PO TB12: 200.0000 mg | ORAL_TABLET | Freq: Every day | ORAL | 2 refills | Status: DC

## 2024-01-05 MED ORDER — TOPIRAMATE 25 MG PO TABS: 25.0000 mg | ORAL_TABLET | Freq: Every day | ORAL | 1 refills | Status: DC

## 2024-01-12 DIAGNOSIS — F331 Major depressive disorder, recurrent, moderate: Secondary | ICD-10-CM | POA: Diagnosis not present

## 2024-02-02 ENCOUNTER — Encounter (INDEPENDENT_AMBULATORY_CARE_PROVIDER_SITE_OTHER): Payer: Self-pay | Admitting: Physician Assistant

## 2024-02-02 ENCOUNTER — Other Ambulatory Visit (INDEPENDENT_AMBULATORY_CARE_PROVIDER_SITE_OTHER): Payer: Self-pay | Admitting: Physician Assistant

## 2024-02-02 ENCOUNTER — Ambulatory Visit (INDEPENDENT_AMBULATORY_CARE_PROVIDER_SITE_OTHER): Admitting: Physician Assistant

## 2024-02-02 VITALS — BP 110/68 | HR 66 | Temp 98.1°F | Ht 61.0 in | Wt 197.0 lb

## 2024-02-02 DIAGNOSIS — F5089 Other specified eating disorder: Secondary | ICD-10-CM | POA: Diagnosis not present

## 2024-02-02 DIAGNOSIS — F3289 Other specified depressive episodes: Secondary | ICD-10-CM

## 2024-02-02 DIAGNOSIS — F5102 Adjustment insomnia: Secondary | ICD-10-CM | POA: Diagnosis not present

## 2024-02-02 DIAGNOSIS — E669 Obesity, unspecified: Secondary | ICD-10-CM | POA: Diagnosis not present

## 2024-02-02 DIAGNOSIS — Z6837 Body mass index (BMI) 37.0-37.9, adult: Secondary | ICD-10-CM

## 2024-02-02 DIAGNOSIS — M170 Bilateral primary osteoarthritis of knee: Secondary | ICD-10-CM

## 2024-02-02 DIAGNOSIS — R7303 Prediabetes: Secondary | ICD-10-CM

## 2024-02-02 DIAGNOSIS — I10 Essential (primary) hypertension: Secondary | ICD-10-CM

## 2024-02-02 DIAGNOSIS — F32A Depression, unspecified: Secondary | ICD-10-CM | POA: Diagnosis not present

## 2024-02-02 MED ORDER — TOPIRAMATE 25 MG PO TABS: 25.0000 mg | ORAL_TABLET | Freq: Every day | ORAL | 1 refills | Status: DC

## 2024-02-02 MED ORDER — BUPROPION HCL ER (XL) 150 MG PO TB24: 150.0000 mg | ORAL_TABLET | Freq: Every day | ORAL | 1 refills | Status: DC

## 2024-02-02 NOTE — Progress Notes (Signed)
 SUBJECTIVE: Discussed the use of AI scribe software for clinical note transcription with the patient, who gave verbal consent to proceed.  Chief Complaint: Obesity  Interim History: She is down 1 lb since her last visit.  Down 26 lbs overall TBW loss of 11.7%  Olivia Burns is here to discuss her progress with her obesity treatment plan. She is on the Category 2 Plan and states she is following her eating plan approximately 60 % of the time. She states she is exercising walking the dog Olivia Burns/riding stationary bike 20-40/20 minutes 7/3 times per week. Olivia Burns is a 73 year old female with obesity who presents for follow-up of her obesity treatment plan.  She is adhering to her obesity treatment plan approximately 60% of the time, resulting in a one-pound weight loss since her last visit, totaling a 26-pound weight loss. Her diet includes the recommended amount of protein and water, but she occasionally skips meals to meet protein and calorie goals. Her diet lacks consistency in whole foods like fruits and vegetables. Emotional eating occurs, particularly when tired, leading to occasional indulgence in treats.  She engages in physical activity by walking her dog for 20 to 40 minutes seven times a week and riding her exercise bike for 20 minutes three times a week.  She does not consistently achieve the recommended seven to nine hours of sleep nightly, often getting only six to seven hours. Her sleep is sometimes disrupted by her 49 year old dog, Olivia Burns, who has occasional restless nights.  She is on a category two obesity treatment plan and takes topiramate  and Wellbutrin . Wellbutrin  is taken first thing in the morning but occasionally in the afternoon. She experiences tremors when holding objects, which she attributes to her medication.  She has a history of prediabetes with cravings and emotional eating behavior. Topiramate  helps with cravings. She uses topical ointments like Icy Hot for knee  pain due to osteoarthritis, which flared up last week, causing swelling and difficulty bearing weight. A neighbor provided her with a cane and walker, and she used ointment to manage the pain.  OBJECTIVE: Visit Diagnoses: Problem List Items Addressed This Visit     Essential hypertension   Prediabetes - Primary   Depression   Relevant Medications   buPROPion  (WELLBUTRIN  XL) 150 MG 24 hr tablet   Obesity with starting BMI of 42.1   BMI 37.0-37.9, adult   Insomnia due to psychological stress   Other Visit Diagnoses       Primary osteoarthritis of both knees          Obesity She is on a category two plan, adhering approximately 60% of the time. She has lost one pound since the previous visit and a total of 26 pounds overall. She reports adequate protein and water intake but occasionally skips meals. She is not consistently consuming whole foods like fruits and vegetables. She engages in regular physical activity by walking her dog and riding an exercise bike. - Continue current nutrition plan with emphasis on consistent protein intake and whole foods. - Encourage regular physical activity, including walking and biking.  Prediabetes with cravings Last A1c was 6.0 - not at goal. Insulin  10.8- not at goal.  Medication(s): Topiramate  25 mg nightly Polyphagia:No- not when eating on plan Lab Results  Component Value Date   HGBA1C 6.0 10/12/2023   HGBA1C 6.2 04/13/2023   HGBA1C 6.3 10/10/2022   HGBA1C 6.1 (H) 12/24/2021   HGBA1C 6.2 07/09/2021   Lab Results  Component Value Date  INSULIN  10.8 12/24/2021    Plan: Continue and refill Topiramate  25 mg nightly Continue working on nutrition plan to decrease simple carbohydrates, increase lean proteins and exercise to promote weight loss, improve glycemic control and prevent progression to Type 2 diabetes.   Hypertension Hypertension asymptomatic, reasonably well controlled, and no significant medication side effects noted.   Medication(s): valsartan -hydrochlorothiazide  80-12.5 mg daily   BP Readings from Last 3 Encounters:  02/02/24 110/68  01/05/24 112/68  12/08/23 107/67   Lab Results  Component Value Date   CREATININE 0.98 10/12/2023   CREATININE 1.00 04/13/2023   CREATININE 0.91 10/10/2022   Lab Results  Component Value Date   GFR 57.27 (L) 10/12/2023   GFR 56.09 (L) 04/13/2023   GFR 63.04 10/10/2022    Plan: Continue valsartan -hydrochlorothiazide  80-12.5 mg daily Continue to work on nutrition plan to promote weight loss and improve BP control.    Bilateral primary osteoarthritis of knee She experienced an acute episode of knee pain last week, with inability to bear weight and mild swelling. The condition improved with rest, use of a cane and walker, and application of topical ointment. She continues to engage in physical activity, which may be contributing to improved mobility. - Consider keeping a supply of topical analgesics like Salonpas or Tiger Balm for symptomatic relief. - Encourage continued physical activity as tolerated. - Use of cane or walker when needed.     Depression/emotional eating Currently on Wellbutrin  and topiramate . Reports tremors, which may be related to Wellbutrin . Discussed reducing the Wellbutrin  dose to assess its impact on symptoms and overall health. Topiramate  is helping with sleep and cravings. No SE with topiramate .  Mood is stable and seeing therapist regularly.  - Reduce Wellbutrin  dose to 150 mg and monitor for changes in symptoms. - Continue topiramate  25 mg once daily at current dose. - Monitor for any changes in mood or energy levels, especially with the change in Wellbutrin  dose. Meds ordered this encounter  Medications   DISCONTD: topiramate  (TOPAMAX ) 25 MG tablet    Sig: Take 1 tablet (25 mg total) by mouth daily at 8 pm. Take 1 tablet daily at 4 pm    Dispense:  30 tablet    Refill:  1   buPROPion  (WELLBUTRIN  XL) 150 MG 24 hr tablet    Sig:  Take 1 tablet (150 mg total) by mouth daily.    Dispense:  30 tablet    Refill:  1    Insomnia Reports not consistently sleeping 7-9 hours nightly, often due to her dog's behavior. Topiramate  is helping her sleep more soundly. Wellbutrin  may interfere with sleep if taken too late in the day. - Continue topiramate  25 mg daily for sleep support in addition to emotional eating behavior. - Ensure Wellbutrin  is taken early in the day to avoid interference with sleep. - Consult with a veterinarian regarding the dog's behavior that may be affecting sleep as may benefit from something to help with sleep as well. Vitals Temp: 98.1 F (36.7 C) BP: 110/68 Pulse Rate: 66 SpO2: 99 %   Anthropometric Measurements Height: 5' 1 (1.549 m) Weight: 197 lb (89.4 kg) BMI (Calculated): 37.24 Weight at Last Visit: 198 lb Weight Lost Since Last Visit: 1 lb Weight Gained Since Last Visit: 0 Starting Weight: 223 lb Total Weight Loss (lbs): 26 lb (11.8 kg)   Body Composition  Body Fat %: 46.7 % Fat Mass (lbs): 92 lbs Muscle Mass (lbs): 99.8 lbs Total Body Water (lbs): 69 lbs Visceral Fat Rating :  15   Other Clinical Data Fasting: No Labs: No Today's Visit #: 29 Starting Date: 12/24/21     ASSESSMENT AND PLAN:  Diet: Olivia Burns is currently in the action stage of change. As such, her goal is to continue with weight loss efforts. She has agreed to Category 2 Plan.  Exercise: Olivia Burns has been instructed to work up to a goal of 150 minutes of combined cardio and strengthening exercise per week and to continue exercising as is for weight loss and overall health benefits.   Behavior Modification:  We discussed the following Behavioral Modification Strategies today: increasing lean protein intake, decreasing simple carbohydrates, increasing vegetables, increase H2O intake, increase high fiber foods, meal planning and cooking strategies, emotional eating strategies , avoiding temptations, and  planning for success. We discussed various medication options to help [facility] with her weight loss efforts and we both agreed to continue current treatment plan- decrease Wellbutrin  SR to 150mg  daily and monitor response. .  Return in about 4 weeks (around 03/01/2024).Olivia Burns She was informed of the importance of frequent follow up visits to maximize her success with intensive lifestyle modifications for her multiple health conditions.  Attestation Statements:   Reviewed by clinician on day of visit: allergies, medications, problem list, medical history, surgical history, family history, social history, and previous encounter notes.   Time spent on visit including pre-visit chart review and post-visit care and charting was 34 minutes.    Olivia Sher, PA-C

## 2024-02-04 ENCOUNTER — Telehealth (INDEPENDENT_AMBULATORY_CARE_PROVIDER_SITE_OTHER): Payer: Self-pay | Admitting: *Deleted

## 2024-02-04 DIAGNOSIS — R7303 Prediabetes: Secondary | ICD-10-CM

## 2024-02-04 NOTE — Telephone Encounter (Signed)
 Received a fax from pharmacy for prescription clarification since it had 2 sets of directions on it. Should patients Topamax  be once daily at 8p or once daily at 4p?

## 2024-02-17 DIAGNOSIS — F331 Major depressive disorder, recurrent, moderate: Secondary | ICD-10-CM | POA: Diagnosis not present

## 2024-02-18 MED ORDER — TOPIRAMATE 25 MG PO TABS: 25.0000 mg | ORAL_TABLET | Freq: Every day | ORAL | 1 refills | Status: DC

## 2024-02-26 ENCOUNTER — Other Ambulatory Visit (INDEPENDENT_AMBULATORY_CARE_PROVIDER_SITE_OTHER): Payer: Self-pay | Admitting: Physician Assistant

## 2024-02-26 DIAGNOSIS — F3289 Other specified depressive episodes: Secondary | ICD-10-CM

## 2024-02-29 NOTE — Progress Notes (Unsigned)
 SUBJECTIVE: Discussed the use of AI scribe software for clinical note transcription with the patient, who gave verbal consent to proceed.  Chief Complaint: Obesity  Interim History: She has maintained her weight since last visit Down 26 lbs overall TBW loss of 11.7%  Muscle mass + 0.6 lbs Adipose mass - 0.6 lbs  Olivia Burns is here to discuss her progress with her obesity treatment plan. She is on the Category 2 Plan and states she is following her eating plan approximately 50 % of the time. She states she is exercising walking [name] her dog 20+  minutes 7 times per week.  Olivia Burns is a 73 year old female who presents for follow-up of her obesity treatment plan.  She reports working on reducing her adipose percentage and has noticed some weight loss over time. She reports being more active, though she feels there is room for improvement. Her current protein intake is adequate, and she is drinking enough water. She typically eats two meals a day, compacting her intake into a brunch and dinner due to her late waking hours. 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  She has a history of prediabetes and experiences cravings. She is currently taking bupropion , which was reduced from 200 mg to 150 mg due to tremors. The tremors have lessened with the lower dose. She also takes a B complex vitamin and Geritol for iron supplementation. Her blood pressure was measured at 102/64 mmHg, and she has a history of hyperlipidemia, though specific details about her current lipid levels or treatment were not discussed.  She suffers from osteoarthritis in her knees, which has been acting up. She had stopped taking collagen, which led to increased body aches, but has since resumed taking it in powdered form, which has helped alleviate her symptoms.  She experiences insomnia, often due to a busy mind. She is taking  topiramate , which has improved her sleep, although she still has occasional sleepless nights. She sometimes wakes up and cannot return to sleep, but finds journaling and reading helpful in these situations. She typically goes to bed around 11:30 PM to 12:00 AM, but sometimes stays up until 1:00 or 2:00 AM. She wakes up around 8:30 or 9:00 AM, often getting about six hours of sleep due to waking during the night. Sees her PCP- Dr. Frann in Nov and will have labs done then.   OBJECTIVE: Visit Diagnoses: Problem List Items Addressed This Visit     Essential hypertension   Prediabetes - Primary   Relevant Medications   topiramate  (TOPAMAX ) 25 MG tablet   Hyperlipidemia   Depression   Relevant Medications   buPROPion  (WELLBUTRIN  XL) 150 MG 24 hr tablet   Obesity with starting BMI of 42.1   BMI 37.0-37.9, adult   Other Visit Diagnoses       Primary osteoarthritis of both knees         Other Specified Feeding or Eating Disorder, Emotional Eating Behaviors         Obesity with emotional eating behavior and prediabetes Adipose percentage decreased from 46% to 45.7% over the summer, indicating progress. Emotional eating behavior managed with bupropion  150 mg, reducing tremors compared to 200 mg. Advised against creatine due to mild kidney issues. - Continue bupropion  150 mg. - Encourage physical activity and muscle gain. - Maintain protein intake and hydration. - Avoid creatine supplements due to mild kidney issues.  Prediabetes  with cravings Last A1c was 6.0- improved but not at goal  Medication(s): Topiramate  25 mg nightly Polyphagia:No Lab Results  Component Value Date   HGBA1C 6.0 10/12/2023   HGBA1C 6.2 04/13/2023   HGBA1C 6.3 10/10/2022   HGBA1C 6.1 (H) 12/24/2021   HGBA1C 6.2 07/09/2021   Lab Results  Component Value Date   INSULIN  10.8 12/24/2021    Plan: Continue and refill Topiramate  25 mg nightly Continue working on nutrition plan to decrease simple  carbohydrates, increase lean proteins and exercise to promote weight loss, improve glycemic control and prevent progression to Type 2 diabetes.   Other depression/emotional eating Olivia Burns has had issues with stress/emotional eating. Currently this is moderately controlled. Overall mood is stable. Medication(s): Bupropion  SR 150 mg daily in am and Topiramate  25 mg at bedtime Reports doing better on lower dose of bupropion - less hand tremor on lower dose. No other side effects.  Topiramate  for cravings and feels helpful for sleep as well.  She is also taking Zoloft and latuda for depression per another provider and feels is helpful, no side effects.  Plan: Continue and refill Bupropion  SR 150 mg daily in am and Topiramate  25 mg at bedtime Continue to work on emotional eating strategies  Osteoarthritis of knees Osteoarthritis with ongoing symptoms. Collagen supplementation reintroduced, alleviating joint pain. - Continue collagen supplementation.  Insomnia Insomnia with occasional difficulty falling asleep due to a busy mind. Topiramate  initiated, improving sleep. Advised to maintain current dose due to medication sensitivity. Discussed sleep hygiene, including consistent sleep schedule and avoiding stimulating activities before bed. - Continue topiramate  25 mg night at current dose. - Encourage consistent sleep schedule and sleep hygiene practices. - Consider journaling or reading to aid sleep onset.  Essential hypertension Essential hypertension well-controlled with current management. Recent blood pressure reading was 102/64 mmHg. BP Readings from Last 3 Encounters:  03/01/24 104/64  02/02/24 110/68  01/05/24 112/68   Lab Results  Component Value Date   NA 139 10/12/2023   CL 102 10/12/2023   K 4.3 10/12/2023   CO2 28 10/12/2023   BUN 17 10/12/2023   CREATININE 0.98 10/12/2023   GFR 57.27 (L) 10/12/2023   CALCIUM  9.5 10/12/2023   ALBUMIN 4.3 10/12/2023   GLUCOSE 92 10/12/2023   Plan; Continue to work on nutrition plan to promote weight loss and improve BP control.  Continue valsartan -hydrochlorothiazide  80-12.5 mg daily     CKD stage 3a Mild chronic kidney disease.  Pateint asked about using creatine supplements. Advised against creatine supplementation due to potential renal impact. Lab Results  Component Value Date   NA 139 10/12/2023   CL 102 10/12/2023   K 4.3 10/12/2023   CO2 28 10/12/2023   BUN 17 10/12/2023   CREATININE 0.98 10/12/2023   GFR 57.27 (L) 10/12/2023   CALCIUM  9.5 10/12/2023   ALBUMIN 4.3 10/12/2023   GLUCOSE 92 10/12/2023   Seeing PCP in Nov and will have followup Plan:  Optimize BP control and prediabetes management.  Advised to avoid nephrotoxic medications and substances  Continue to hydrate well daily - Avoid creatine supplementation.  Vitals Temp: 98.4 F (36.9 C) BP: 104/64 Pulse Rate: 66 SpO2: 98 %   Anthropometric Measurements Height: 5' 1 (1.549 m) Weight: 197 lb (89.4 kg) BMI (Calculated): 37.24 Weight at Last Visit: 197 lb Weight Lost Since Last Visit: 0 Weight Gained Since Last Visit: 0 Starting Weight: 223 lb Total Weight Loss (lbs): 26 lb (11.8 kg)   Body Composition  Body Fat %: 46.3 %  Fat Mass (lbs): 91.4 lbs Muscle Mass (lbs): 100.4 lbs Total Body Water (lbs): 68.4 lbs Visceral Fat Rating : 15   Other Clinical Data Fasting: Yes Labs: No Today's Visit #: 30 Starting Date: 12/24/21     ASSESSMENT AND PLAN:  Diet: Olivia Burns is currently in the action stage of change. As such, her goal is to continue with weight loss efforts. She has agreed to Category 2 Plan.  Exercise: Olivia Burns has been instructed to work up to a goal of 150 minutes of combined cardio and strengthening exercise per week and to try a geriatric exercise plan for weight loss and overall health benefits.   Behavior Modification:  We discussed the following Behavioral Modification Strategies today: increasing lean protein  intake, decreasing simple carbohydrates, increasing vegetables, increase H2O intake, increase high fiber foods, meal planning and cooking strategies, emotional eating strategies , avoiding temptations, and planning for success. We discussed various medication options to help Olivia Burns with her weight loss efforts and we both agreed to continue current treatment plan.  Return in about 4 weeks (around 03/29/2024).Olivia Burns She was informed of the importance of frequent follow up visits to maximize her success with intensive lifestyle modifications for her multiple health conditions.  Attestation Statements:   Reviewed by clinician on day of visit: allergies, medications, problem list, medical history, surgical history, family history, social history, and previous encounter notes.   Time spent on visit including pre-visit chart review and post-visit care and charting was 43 minutes.    Olivia Rossbach, PA-C

## 2024-03-01 ENCOUNTER — Encounter (INDEPENDENT_AMBULATORY_CARE_PROVIDER_SITE_OTHER): Payer: Self-pay | Admitting: Physician Assistant

## 2024-03-01 ENCOUNTER — Ambulatory Visit (INDEPENDENT_AMBULATORY_CARE_PROVIDER_SITE_OTHER): Admitting: Physician Assistant

## 2024-03-01 VITALS — BP 104/64 | HR 66 | Temp 98.4°F | Ht 61.0 in | Wt 197.0 lb

## 2024-03-01 DIAGNOSIS — F3289 Other specified depressive episodes: Secondary | ICD-10-CM | POA: Diagnosis not present

## 2024-03-01 DIAGNOSIS — M170 Bilateral primary osteoarthritis of knee: Secondary | ICD-10-CM

## 2024-03-01 DIAGNOSIS — E669 Obesity, unspecified: Secondary | ICD-10-CM

## 2024-03-01 DIAGNOSIS — G4700 Insomnia, unspecified: Secondary | ICD-10-CM | POA: Diagnosis not present

## 2024-03-01 DIAGNOSIS — Z6837 Body mass index (BMI) 37.0-37.9, adult: Secondary | ICD-10-CM

## 2024-03-01 DIAGNOSIS — I10 Essential (primary) hypertension: Secondary | ICD-10-CM

## 2024-03-01 DIAGNOSIS — F5102 Adjustment insomnia: Secondary | ICD-10-CM

## 2024-03-01 DIAGNOSIS — N1831 Chronic kidney disease, stage 3a: Secondary | ICD-10-CM

## 2024-03-01 DIAGNOSIS — I129 Hypertensive chronic kidney disease with stage 1 through stage 4 chronic kidney disease, or unspecified chronic kidney disease: Secondary | ICD-10-CM | POA: Diagnosis not present

## 2024-03-01 DIAGNOSIS — R7303 Prediabetes: Secondary | ICD-10-CM

## 2024-03-01 DIAGNOSIS — F5089 Other specified eating disorder: Secondary | ICD-10-CM

## 2024-03-01 DIAGNOSIS — E785 Hyperlipidemia, unspecified: Secondary | ICD-10-CM

## 2024-03-01 MED ORDER — BUPROPION HCL ER (XL) 150 MG PO TB24: 150.0000 mg | ORAL_TABLET | Freq: Every day | ORAL | 1 refills | Status: DC

## 2024-03-01 MED ORDER — TOPIRAMATE 25 MG PO TABS: 25.0000 mg | ORAL_TABLET | Freq: Every day | ORAL | 1 refills | Status: DC

## 2024-03-07 DIAGNOSIS — F339 Major depressive disorder, recurrent, unspecified: Secondary | ICD-10-CM | POA: Diagnosis not present

## 2024-03-07 DIAGNOSIS — F5081 Binge eating disorder, mild: Secondary | ICD-10-CM | POA: Diagnosis not present

## 2024-03-07 DIAGNOSIS — F331 Major depressive disorder, recurrent, moderate: Secondary | ICD-10-CM | POA: Diagnosis not present

## 2024-03-23 DIAGNOSIS — F331 Major depressive disorder, recurrent, moderate: Secondary | ICD-10-CM | POA: Diagnosis not present

## 2024-03-29 ENCOUNTER — Ambulatory Visit (INDEPENDENT_AMBULATORY_CARE_PROVIDER_SITE_OTHER): Admitting: Physician Assistant

## 2024-03-29 ENCOUNTER — Encounter (INDEPENDENT_AMBULATORY_CARE_PROVIDER_SITE_OTHER): Payer: Self-pay | Admitting: Physician Assistant

## 2024-03-29 VITALS — BP 106/66 | HR 74 | Temp 98.1°F | Ht 61.0 in | Wt 198.0 lb

## 2024-03-29 DIAGNOSIS — F5089 Other specified eating disorder: Secondary | ICD-10-CM | POA: Diagnosis not present

## 2024-03-29 DIAGNOSIS — Z6837 Body mass index (BMI) 37.0-37.9, adult: Secondary | ICD-10-CM

## 2024-03-29 DIAGNOSIS — I10 Essential (primary) hypertension: Secondary | ICD-10-CM

## 2024-03-29 DIAGNOSIS — R7303 Prediabetes: Secondary | ICD-10-CM

## 2024-03-29 DIAGNOSIS — F32A Depression, unspecified: Secondary | ICD-10-CM

## 2024-03-29 DIAGNOSIS — E669 Obesity, unspecified: Secondary | ICD-10-CM | POA: Diagnosis not present

## 2024-03-29 DIAGNOSIS — F3289 Other specified depressive episodes: Secondary | ICD-10-CM

## 2024-03-29 MED ORDER — BUPROPION HCL ER (XL) 150 MG PO TB24: 150.0000 mg | ORAL_TABLET | Freq: Every day | ORAL | 1 refills | Status: DC

## 2024-03-29 NOTE — Progress Notes (Signed)
 SUBJECTIVE: Discussed the use of AI scribe software for clinical note transcription with the patient, who gave verbal consent to proceed.  Chief Complaint: Obesity  Interim History: She is up 1 lbs since her last visit.  Down 25 lbs overall TBW loss of 11.2%  Olivia Burns is here to discuss her progress with her obesity treatment plan. She is on the Category 2 Plan and states she is following her eating plan approximately 50 % of the time. She states she is exercising walking the dog/ride stationary bike 20/20 minutes 7/2 times per week. Olivia Burns is a 73 year old female who presents for follow-up of her obesity treatment plan.  She is adhering to a category two nutrition plan about 50% of the time. Despite gaining one pound since her last visit, she remains 25 pounds down overall. Her diet includes more whole foods, but she struggles to consistently meet the recommended protein intake. She is drinking more water but occasionally skips meals.  She sleeps seven to nine hours nightly and exercises by walking her dog for 20 minutes daily and riding her stationary bike twice a week for 20 minutes. Recently, she consumed fast food for three days due to not eating before leaving the house, which she attributes to sleeping late.  Her current medications include bupropion  150 mg daily for emotional eating and topiramate  25 mg at bedtime for cravings, which she finds helpful. She is also on rosuvastatin  5 mg daily for hyperlipidemia and valsartan  hydrochlorothiazide  80/12.5 mg daily for hypertension. Her Latuda dose was increased, improving her overall well-being, though she notes it may cause salt cravings and increased thirst. She takes topiramate  and Latuda together at bedtime, aiding her sleep, but she tends to go to bed late due to watching TV.  Her typical diet consists of a morning protein drink with 30 grams of protein, followed by chicken or fish for lunch and dinner, often with a salad. She  snacks on apples with peanut butter, limiting the peanut butter to two tablespoons or less. She occasionally craves salt and sugar but has avoided purchasing potato chips despite these cravings.  She plans to celebrate her birthday and Thanksgiving soon, with potential plans to visit her cousin's house. She does not participate in [place] [employer] but buys small gifts for neighborhood children.  OBJECTIVE: Visit Diagnoses: Problem List Items Addressed This Visit     Essential hypertension   Prediabetes - Primary   Depression   Relevant Medications   buPROPion  (WELLBUTRIN  XL) 150 MG 24 hr tablet   Obesity with starting BMI of 42.1   BMI 37.0-37.9, adult  Obesity  Weight gain of one pound since last visit, with maintenance of muscle mass. Overall weight loss of 25 pounds. Following a category two nutrition plan approximately 50% of the time. Consuming more whole foods but not consistently meeting protein recommendations. Skipping some meals. Engaging in regular physical activity. Emotional eating and cravings managed with bupropion  and topiramate . Latuda may contribute to salt cravings and increased thirst. - Continue/refill bupropion  150 mg daily for emotional eating. - Continue topiramate  25 mg at bedtime for cravings. - Encouraged adherence to nutrition plan and increased protein intake. - Advised on portion control strategies, especially during events like Thanksgiving. - Discussed potential impact of Latuda on salt cravings and thirst.  Essential hypertension Blood pressure management with valsartan  hydrochlorothiazide  80/12.5 mg daily. No SE.  BP Readings from Last 3 Encounters:  03/29/24 106/66  03/01/24 104/64  02/02/24 110/68   Lab  Results  Component Value Date   NA 139 10/12/2023   CL 102 10/12/2023   K 4.3 10/12/2023   CO2 28 10/12/2023   BUN 17 10/12/2023   CREATININE 0.98 10/12/2023   GFR 57.27 (L) 10/12/2023   CALCIUM  9.5 10/12/2023   ALBUMIN 4.3 10/12/2023    GLUCOSE 92 10/12/2023   Plan:  Continue to work on nutrition plan to promote weight loss and improve BP control.  - Continue valsartan  hydrochlorothiazide  80/12.5 mg daily.   Prediabetes Managed with lifestyle modifications including diet and exercise. Not currently taking medication.  Lab Results  Component Value Date   HGBA1C 6.0 10/12/2023   HGBA1C 6.2 04/13/2023   HGBA1C 6.3 10/10/2022   Lab Results  Component Value Date   LDLCALC 119 (H) 10/12/2023   CREATININE 0.98 10/12/2023   INSULIN   Date Value Ref Range Status  12/24/2021 10.8 2.6 - 24.9 uIU/mL Final  ]Continue working on nutrition plan to decrease simple carbohydrates, increase lean proteins and exercise to promote weight loss, improve glycemic control and prevent progression to Type 2 diabetes.    Depression with emotional eating behaviors.  Managed with bupropion  and Latuda. Recent increase in Latuda dosage due to decreased efficacy at lower dose. Latuda may contribute to salt cravings and increased thirst as well as slight weight gain.  Bupropion  dosage reduced recently, resulting in decreased tremors.Also taking sertraline 50 mg daily.  - Continue decreased bupropion  150 mg daily and monitor response. - Continue Latuda at increased dosage and monitor closely - Monitor for side effects such as salt cravings and increased thirst. - Continue to work on emotional eating strategies.   Vitals Temp: 98.1 F (36.7 C) BP: 106/66 Pulse Rate: 74 SpO2: 100 %   Anthropometric Measurements Height: 5' 1 (1.549 m) Weight: 198 lb (89.8 kg) BMI (Calculated): 37.43 Weight at Last Visit: 197 lb Weight Lost Since Last Visit: 0 Weight Gained Since Last Visit: 1 lb Starting Weight: 223 lb Total Weight Loss (lbs): 25 lb (11.3 kg)   Body Composition  Body Fat %: 46.6 % Fat Mass (lbs): 92.4 lbs Muscle Mass (lbs): 100.4 lbs Total Body Water (lbs): 68.6 lbs Visceral Fat Rating : 16   Other Clinical Data Fasting:  Yes Labs: No Today's Visit #: 31 Starting Date: 12/24/21     ASSESSMENT AND PLAN:  Diet: Phinley is currently in the action stage of change. As such, her goal is to continue with weight loss efforts. She has agreed to Category 2 Plan.  Exercise: Kailen has been instructed to work up to a goal of 150 minutes of combined cardio and strengthening exercise per week and to continue exercising as is for weight loss and overall health benefits.   Behavior Modification:  We discussed the following Behavioral Modification Strategies today: increasing lean protein intake, decreasing simple carbohydrates, increasing vegetables, increase H2O intake, increase high fiber foods, meal planning and cooking strategies, better snacking choices, avoiding temptations, and planning for success. We discussed various medication options to help [facility] with her weight loss efforts and we both agreed to continue current treatment plan.  Return in about 4 weeks (around 04/26/2024).SABRA She was informed of the importance of frequent follow up visits to maximize her success with intensive lifestyle modifications for her multiple health conditions.  Attestation Statements:   Reviewed by clinician on day of visit: allergies, medications, problem list, medical history, surgical history, family history, social history, and previous encounter notes.   Time spent on visit including pre-visit chart review and  post-visit care and charting was 41 minutes.    Isabelly Kobler, PA-C

## 2024-04-04 ENCOUNTER — Other Ambulatory Visit: Payer: Self-pay | Admitting: Medical Genetics

## 2024-04-04 DIAGNOSIS — Z006 Encounter for examination for normal comparison and control in clinical research program: Secondary | ICD-10-CM

## 2024-04-12 ENCOUNTER — Ambulatory Visit: Payer: Self-pay | Admitting: Family Medicine

## 2024-04-12 ENCOUNTER — Ambulatory Visit: Admitting: Family Medicine

## 2024-04-12 ENCOUNTER — Encounter: Payer: Self-pay | Admitting: Family Medicine

## 2024-04-12 VITALS — BP 130/68 | HR 86 | Temp 98.0°F | Resp 16 | Ht 61.0 in | Wt 201.6 lb

## 2024-04-12 DIAGNOSIS — R079 Chest pain, unspecified: Secondary | ICD-10-CM

## 2024-04-12 DIAGNOSIS — I10 Essential (primary) hypertension: Secondary | ICD-10-CM

## 2024-04-12 DIAGNOSIS — R790 Abnormal level of blood mineral: Secondary | ICD-10-CM | POA: Diagnosis not present

## 2024-04-12 DIAGNOSIS — E785 Hyperlipidemia, unspecified: Secondary | ICD-10-CM

## 2024-04-12 DIAGNOSIS — R7303 Prediabetes: Secondary | ICD-10-CM | POA: Diagnosis not present

## 2024-04-12 LAB — IBC + FERRITIN
Ferritin: 45.3 ng/mL (ref 10.0–291.0)
Iron: 79 ug/dL (ref 42–145)
Saturation Ratios: 23.6 % (ref 20.0–50.0)
TIBC: 334.6 ug/dL (ref 250.0–450.0)
Transferrin: 239 mg/dL (ref 212.0–360.0)

## 2024-04-12 LAB — COMPREHENSIVE METABOLIC PANEL WITH GFR
ALT: 19 U/L (ref 0–35)
AST: 19 U/L (ref 0–37)
Albumin: 4.2 g/dL (ref 3.5–5.2)
Alkaline Phosphatase: 77 U/L (ref 39–117)
BUN: 18 mg/dL (ref 6–23)
CO2: 30 meq/L (ref 19–32)
Calcium: 9.2 mg/dL (ref 8.4–10.5)
Chloride: 102 meq/L (ref 96–112)
Creatinine, Ser: 0.93 mg/dL (ref 0.40–1.20)
GFR: 60.77 mL/min (ref >60.00)
Glucose, Bld: 88 mg/dL (ref 70–99)
Potassium: 3.6 meq/L (ref 3.5–5.1)
Sodium: 141 meq/L (ref 135–145)
Total Bilirubin: 0.4 mg/dL (ref 0.2–1.2)
Total Protein: 6.7 g/dL (ref 6.0–8.3)

## 2024-04-12 LAB — LIPID PANEL
Cholesterol: 174 mg/dL (ref 0–200)
HDL: 60.3 mg/dL (ref >39.00)
LDL Cholesterol: 98 mg/dL (ref 0–99)
NonHDL: 113.59
Total CHOL/HDL Ratio: 3
Triglycerides: 79 mg/dL (ref 0.0–149.0)
VLDL: 15.8 mg/dL (ref 0.0–40.0)

## 2024-04-12 LAB — HEMOGLOBIN A1C: Hgb A1c MFr Bld: 6.1 % (ref 4.6–6.5)

## 2024-04-12 LAB — CBC
HCT: 37.8 % (ref 36.0–46.0)
Hemoglobin: 12.8 g/dL (ref 12.0–15.0)
MCHC: 33.8 g/dL (ref 30.0–36.0)
MCV: 82.1 fl (ref 78.0–100.0)
Platelets: 243 K/uL (ref 150.0–400.0)
RBC: 4.6 Mil/uL (ref 3.87–5.11)
RDW: 13.9 % (ref 11.5–15.5)
WBC: 4.9 K/uL (ref 4.0–10.5)

## 2024-04-12 MED ORDER — VALSARTAN-HYDROCHLOROTHIAZIDE 80-12.5 MG PO TABS: 1.0000 | ORAL_TABLET | Freq: Every day | ORAL | 3 refills | Status: AC

## 2024-04-12 NOTE — Progress Notes (Signed)
 Chief Complaint  Patient presents with   Medication Refill    Medication Refill    Subjective Olivia Burns is a 73 y.o. female who presents for hypertension follow up. She does monitor home blood pressures. Blood pressures ranging from 110-130's/70's on average. She is compliant with medications-- Diovan -HCT 80-12.5 mg/d. Patient has these side effects of medication: none She is sometimes adhering to a healthy diet overall. Current exercise: walking No SOB.   Hyperlipidemia Patient presents for dyslipidemia follow up. Currently being treated with Crestor  5 mg/d and compliance with treatment thus far has been good. She denies myalgias. Diet/exercise as above.  The patient is not known to have coexisting coronary artery disease.  L sided chest pain Duration of issue: 2 months Quality: achy Palliation: None Provocation: Physical exertion Radiation: towards center of chest Duration of chest pain: 10 minutes Associated symptoms: no arm/jaw pain, SOB Cardiac history: no known CAD Family heart history: CAD in mom/dad Smoker? No   Past Medical History:  Diagnosis Date   Allergy    Anemia    Anxiety    Arthritis    Asthma    Constipation    Depression    GERD (gastroesophageal reflux disease)    Hyperlipidemia    Hypertension    Osteoarthritis    Pre-diabetes    Sleep apnea     Exam BP 130/68 (BP Location: Left Arm, Patient Position: Sitting)   Pulse 86   Temp 98 F (36.7 C) (Oral)   Resp 16   Ht 5' 1 (1.549 m)   Wt 201 lb 9.6 oz (91.4 kg)   SpO2 98%   BMI 38.09 kg/m  General:  well developed, well nourished, in no apparent distress Heart: RRR, no bruits, no LE edema Lungs: clear to auscultation, no accessory muscle use MSK: Chest pain is reproducible to palpation on L chest wall Psych: well oriented with normal range of affect and appropriate judgment/insight  Essential hypertension  Hyperlipidemia, unspecified hyperlipidemia type - Plan:  Comprehensive metabolic panel with GFR, Lipid panel  Prediabetes - Plan: Hemoglobin A1c  Low ferritin - Plan: IBC + Ferritin, CBC  Exertional chest pain - Plan: Ambulatory referral to Cardiology, EKG 12-Lead  Chronic, stable.  Continue Diovan -HCTZ 80-12.5 mg daily.  Counseled on diet and exercise. Chronic, stable.  Continue Crestor  5 mg daily. Check A1c. Check iron. New issue.  Probably musculoskeletal but will refer to cardiology to be on the safe side. EKG shows NSR, normal axis, no interval abnormalities, no ST segment or T wave changes, good R wave progression.  I gave her some stretches and exercises to rehab the chest wall.  Activity as tolerated overall though.  She was verbally instructed to go to the ER if things worsen. F/u in 6 months or as needed. The patient voiced understanding and agreement to the plan.  Mabel Mt [place], DO 04/12/24  11:59 AM

## 2024-04-12 NOTE — Patient Instructions (Addendum)
 Give [place] 2-3 business days to get the results of your labs back.   Keep the diet clean and stay active.  If you do not hear anything about your referral in the next 1-2 weeks, call our office and ask for an update.  Let us know if you need anything.  Pectoralis Major Rehab Ask your health care provider which exercises are safe for you. Do exercises exactly as told by your health care provider and adjust them as directed. It is normal to feel mild stretching, pulling, tightness, or discomfort as you do these exercises, but you should stop right away if you feel sudden pain or your pain gets worse. Do not begin these exercises until told by your health care provider. Stretching and range of motion exercises These exercises warm up your muscles and joints and improve the movement and flexibility of your shoulder. These exercises can also help to relieve pain, numbness, and tingling. Exercise A: Pendulum  Stand near a wall or a surface that you can hold onto for balance. Bend at the waist and let your left / right arm hang straight down. Use your other arm to keep your balance. Relax your arm and shoulder muscles, and move your hips and your trunk so your left / right arm swings freely. Your arm should swing because of the motion of your body, not because you are using your arm or shoulder muscles. Keep moving so your arm swings in the following directions, as told by your health care provider: Side to side. Forward and backward. In clockwise and counterclockwise circles. Slowly return to the starting position. Repeat 2 times. Complete this exercise 3 times per week. Exercise B: Abduction, standing Stand and hold a broomstick, a cane, or a similar object. Place your hands a little more than shoulder-width apart on the object. Your left / right hand should be palm-up, and your other hand should be palm-down. While keeping your elbow straight and your shoulder muscles relaxed, push the stick across  your body toward your left / right side. Raise your left / right arm to the side of your body and then over your head until you feel a stretch in your shoulder. Stop when you reach the angle that is recommended by your health care provider. Avoid shrugging your shoulder while you raise your arm. Keep your shoulder blade tucked down toward the middle of your spine. Hold for 10 seconds. Slowly return to the starting position. Repeat 2 times. Complete this exercise 3 times per week. Exercise C: Wand flexion, supine  Lie on your back. You may bend your knees for comfort. Hold a broomstick, a cane, or a similar object so that your hands are about shoulder-width apart on the object. Your palms should face toward your feet. Raise your left / right arm in front of your face, then behind your head (toward the floor). Use your other hand to help you do this. Stop when you feel a gentle stretch in your shoulder, or when you reach the angle that is recommended by your health care provider. Hold for 3 seconds. Use the broomstick and your other arm to help you return your left / right arm to the starting position. Repeat 2 times. Complete this exercise 3 times per week. Exercise D: Wand shoulder external rotation Stand and hold a broomstick, a cane, or a similar object so your hands are about shoulder-width apart on the object. Start with your arms hanging down, then bend both elbows to an "  L" shape (90 degrees). Keep your left / right elbow at your side. Use your other hand to push the stick so your left / right forearm moves away from your body, out to your side. Keep your left / right elbow bent to 90 degrees and keep it against your side. Stop when you feel a gentle stretch in your shoulder, or when you reach the angle recommended by your health care provider. Hold for 10 seconds. Use the stick to help you return your left / right arm to the starting position. Repeat 2 times. Complete this exercise 3  times per week. Strengthening exercises These exercises build strength and endurance in your shoulder. Endurance is the ability to use your muscles for a long time, even after your muscles get tired. Exercise E: Scapular protraction, standing Stand so you are facing a wall. Place your feet about one arm-length away from the wall. Place your hands on the wall and straighten your elbows. Keep your hands on the wall as you push your upper back away from the wall. You should feel your shoulder blades sliding forward. Keep your elbows and your head still. If you are not sure that you are doing this exercise correctly, ask your health care provider for more instructions. Hold for 3 seconds. Slowly return to the starting position. Let your muscles relax completely before you repeat this exercise. Repeat 2 times. Complete this exercise 3 times per week. Exercise F: Shoulder blade squeezes  (scapular retraction) Sit with good posture in a stable chair. Do not let your back touch the back of the chair. Your arms should be at your sides with your elbows bent. You may rest your forearms on a pillow if that is more comfortable. Squeeze your shoulder blades together. Bring them down and back. Keep your shoulders level. Do not lift your shoulders up toward your ears. Hold for 3 seconds. Return to the starting position. Repeat 2 times. Complete this exercise 3 times per week. This information is not intended to replace advice given to you by your health care provider. Make sure you discuss any questions you have with your health care provider. Document Released: 05/19/2005 Document Revised: 02/28/2016 Document Reviewed: 02/04/2015 Elsevier Interactive Patient Education  [employer].

## 2024-04-13 ENCOUNTER — Ambulatory Visit: Admitting: Family Medicine

## 2024-04-17 NOTE — Progress Notes (Unsigned)
 Cardiology Office Note:  .   Date:  04/18/2024  ID:  Olivia Burns, DOB Jul 28, 1950, MRN [id number] PCP: Frann Mabel Mt, DO  [facility] Providers Cardiologist:  Darryle ONEIDA Decent, MD { History of Present Illness: .    Chief Complaint  Patient presents with   Chest Pain   New Patient (Initial Visit)    Olivia Burns is a 73 y.o. female with history of HTN, HLD who presents for the evaluation of chest pain at the request of Frann Mabel Mt, DO.   History of Present Illness   Olivia Burns is a 73 year old female with hypertension who presents with chest pain.   She has been experiencing sharp, stabbing chest pain for over a month and a half. The pain occurs sporadically without a consistent pattern and radiates outward from the chest. It can happen during activities such as housework or while sitting, lasting for seconds to a few minutes before resolving on its own. There are no specific triggers identified, and the pain is concerning to her.  No fevers, chills, cough, or congestion, although she mentions having constant sinus issues for months. She denies breathing difficulties.  Her family history is significant for heart disease in both parents, who are deceased.  She has a history of hypertension, managed with valsartan  80 mg and hydrochlorothiazide  12.5 mg daily, and hyperlipidemia, for which she takes Crestor  5 mg daily. She also has a history of depression and knee pain, described as bone-on-bone, previously treated with prednisone injections.  In terms of her social history, she is retired after working at the [employer] for forty years. She is single, has never married, and has no children. She does not smoke or consume alcohol.            Problem List HTN HLD -T chol 174, HDL 60, LDL 98, TG 79    ROS: All other ROS reviewed and negative. Pertinent positives noted in the HPI.     Studies Reviewed: SABRA   EKG  Interpretation Date/Time:  Monday April 18 2024 09:13:26 EST Ventricular Rate:  69 PR Interval:  158 QRS Duration:  70 QT Interval:  388 QTC Calculation: 415 R Axis:   53  Text Interpretation: Normal sinus rhythm Normal ECG Confirmed by Decent Darryle [phone]) on 04/18/2024 9:17:23 AM   Physical Exam:   VS:  BP 104/64 (BP Location: Left Arm, Patient Position: Sitting, Cuff Size: Large)   Pulse 67   Resp 16   Ht 5' 1 (1.549 m)   Wt 201 lb 6.4 oz (91.4 kg)   SpO2 100%   BMI 38.05 kg/m    Wt Readings from Last 3 Encounters:  04/18/24 201 lb 6.4 oz (91.4 kg)  04/12/24 201 lb 9.6 oz (91.4 kg)  03/29/24 198 lb (89.8 kg)    GEN: Well nourished, well developed in no acute distress NECK: No JVD; No carotid bruits CARDIAC: RRR, no murmurs, rubs, gallops RESPIRATORY:  Clear to auscultation without rales, wheezing or rhonchi  ABDOMEN: Soft, non-tender, non-distended EXTREMITIES:  No edema; No deformity  ASSESSMENT AND PLAN: .   Assessment and Plan    Chest pain, likely non-cardiac, undergoing coronary CTA evaluation Intermittent sharp chest pain with normal EKG. Differential includes non-cardiac causes. Coronary CTA warranted due to family history of heart disease. - Ordered coronary CTA. - Scheduled CT scan within the next week. - No activity restrictions until further evaluation. - No need for BB given HR 67 bpm.  Recent BMP obtained and normal.   Hypertension, controlled on valsartan -hydrochlorothiazide  Hypertension well-controlled on valsartan -hydrochlorothiazide . - Continue valsartan -hydrochlorothiazide  80/12.5 mg daily.  Mixed hyperlipidemia, managed with Crestor , further titration pending CTA results Mixed hyperlipidemia managed with Crestor . Recent LDL 98. Titration pending CTA results. - Continue Crestor  5 mg daily. - Reassess lipid management based on CTA results.              Follow-up: Return if symptoms worsen or fail to improve.  Signed, Darryle DASEN. Barbaraann,  MD, [facility]  [facility]  [address] [place], [place] [address] [phone]  9:46 AM

## 2024-04-18 ENCOUNTER — Encounter: Payer: Self-pay | Admitting: Cardiovascular Disease

## 2024-04-18 ENCOUNTER — Ambulatory Visit: Attending: Cardiovascular Disease | Admitting: Cardiovascular Disease

## 2024-04-18 VITALS — BP 104/64 | HR 67 | Resp 16 | Ht 61.0 in | Wt 201.4 lb

## 2024-04-18 DIAGNOSIS — I150 Renovascular hypertension: Secondary | ICD-10-CM | POA: Diagnosis not present

## 2024-04-18 DIAGNOSIS — R072 Precordial pain: Secondary | ICD-10-CM | POA: Insufficient documentation

## 2024-04-18 DIAGNOSIS — E782 Mixed hyperlipidemia: Secondary | ICD-10-CM | POA: Insufficient documentation

## 2024-04-18 NOTE — Patient Instructions (Addendum)
 Medication Instructions:   No changes *If you need a refill on your cardiac medications before your next appointment, please call your pharmacy*   Lab Work: Not needed If you have labs (blood work) drawn today and your tests are completely normal, you will receive your results only by: MyChart Message (if you have MyChart) OR A paper copy in the mail If you have any lab test that is abnormal or we need to change your treatment, we will call you to review the results.   Testing/Procedures: Your physician has requested that you have coronary  CTA. Coronary computed tomography (CT)angiogram  is a special type of CT scan that uses a computer to produce multi-dimensional views of major blood vessels throughout the heart.  CT angiography, a contrast material is injected through an IV to help visualize the blood vessels  a painless test that uses an x-ray machine to take clear, detailed pictures of your heart arteries .  Please follow instruction sheet as given.    Follow-Up: At [facility], you and your health needs are our priority.  As part of our continuing mission to provide you with exceptional heart care, we have created designated Provider Care Teams.  These Care Teams include your primary Cardiologist (physician) and Advanced Practice Providers (APPs -  Physician Assistants and Nurse Practitioners) who all work together to provide you with the care you need, when you need it.     Your next appointment:   As needed   The format for your next appointment:   In Person  Provider:   Darryle ONEIDA Decent, MD   Other Instructions     Your cardiac CT will be scheduled at one of the below locations:       [facility] [address]  [place], [place] [address]     If scheduled at the [facility] at [address], please enter the parking lot using the [address] entrance and use the FREE valet service at the patient drop-off  area. Enter the building and check-in with registration on the main floor.     Please follow these instructions carefully (unless otherwise directed):  An IV will be required for this test and Nitroglycerin will be given.    On the Night Before the Test: Be sure to Drink plenty of water. Do not consume any caffeinated/decaffeinated beverages or chocolate 12 hours prior to your test. Do not take any antihistamines 12 hours prior to your test.    On the Day of the Test: Drink plenty of water until 1 hour prior to the test. Do not eat any food 1 hour prior to test. You may take your regular medications prior to the test If you take Valsartan -Hydrochlorothiazide , please HOLD on the morning of the test. FEMALES- please wear underwire-free bra if available, avoid dresses & tight clothing No  other medication needed   After the Test: Drink plenty of water. After receiving IV contrast, you may experience a mild flushed feeling. This is normal. On occasion, you may experience a mild rash up to 24 hours after the test. This is not dangerous. If this occurs, you can take Benadryl 25 mg, Zyrtec, Claritin, or Allegra and increase your fluid intake. (Patients taking Tikosyn should avoid Benadryl, and may take Zyrtec, Claritin, or Allegra) If you experience trouble breathing, this can be serious. If it is severe call 911 IMMEDIATELY. If it is mild, please call our office.  We will call to  schedule your test 2-4 weeks out understanding that some insurance companies will need an authorization prior to the service being performed.   For more information and frequently asked questions, please visit our website : [email or web address]  For non-scheduling related questions, please contact the cardiac imaging nurse navigator should you have any questions/concerns: Cardiac Imaging Nurse Navigators Direct Office Dial: [phone]   For scheduling needs, including cancellations and  rescheduling, please call Brittany, [phone].

## 2024-04-25 NOTE — Progress Notes (Unsigned)
 SUBJECTIVE: Discussed the use of AI scribe software for clinical note transcription with the patient, who gave verbal consent to proceed.  Chief Complaint: Obesity  Interim History: She is up 4 lbs since her last visit.  Down 21 lbs overall TBW loss of 9.4%  Olivia Burns is here to discuss her progress with her obesity treatment plan. She is on the Category 2 Plan and states she is following her eating plan approximately 50 % of the time. She states she is exercising stationary bike 10 minutes 2 times per week.  Olivia Burns is a 73 year old female with obesity, prediabetes, hypertension, and hyperlipidemia who presents for follow-up of her obesity treatment plan.  She is adhering to a Category 2 nutrition plan about fifty percent of the time and engages in physical activity by riding her stationary bike for ten minutes two days a week. Despite these efforts, she has experienced weight gain, which she attributes to decreased mobility due to knee pain.  She has developed increased knee pain recently, significantly limiting her mobility. For pain relief, she applies cream, uses Pepton, and takes Tylenol. She is considering another injection for pain relief, as the last one was in May. The knee pain has been severe enough to prevent her from putting weight on it one morning, affecting her ability to walk her dog.  She experiences chest discomfort described as sharp and radiating, with an episode of feeling faint while doing dishes. Her blood pressure was 158/80s during this episode. She underwent a CT scan on December 1st and had an EKG, which was reported as fine.  Her current medications include topiramate  25 mg daily in the evening for prediabetes with cravings, bupropion  150 mg daily for depression with emotional eating, rosuvastatin  5 mg daily for hyperlipidemia, and valsartan /hydrochlorothiazide  80/12.5 mg daily for hypertension. She also takes a multivitamin with minerals daily.  Recent  blood work includes kidney and liver function tests, cholesterol levels, and an A1c of 6.1. Upcoming- Scheduled for Coronary CT 05/02/24 due to episodic precordial chest pain of unclear source.   OBJECTIVE: Visit Diagnoses: Problem List Items Addressed This Visit     Essential hypertension   Prediabetes   Hyperlipidemia   Depression   Relevant Medications   buPROPion  (WELLBUTRIN  XL) 150 MG 24 hr tablet   Obesity with starting BMI of 42.1   Primary osteoarthritis of right knee   Other Visit Diagnoses       Exertional chest pain- Coronary Ca++ score pending for 05/02/24    -  Primary     BMI 38.0-38.9,adult Current BMI 38.2         Obesity  BMI is 38.2 today. Weight gain likely due to decreased mobility from knee pain. Current BMI is close to the threshold for knee replacement surgery and we discussed trying to keep her weight maintained/weight loss if thinking of surgery soon. - Encouraged adherence to Category 2 nutrition plan. - Advised to maintain mobility as much as possible. - Maintain or weight loss if anticipates knee replacement surgery next year to keep BMI < 40. - Discussed holiday strategies today, most of which the patient recalled from previous years!   Right knee pain/OA Acute exacerbation of right knee pain, limiting mobility. Last injection was in May. Pain management includes topical creams, Tylenol, and Pepton. Considering knee replacement surgery next year if pain persists. - Advised to seek knee injection for pain relief. - Recommended wearing knee brace and compression stockings for support. - She is  considering potential for knee replacement surgery next year if pain persists.  Prediabetes with cravings A1c is 6.1, indicating prediabetes. Current management includes topiramate  for cravings. Lab Results  Component Value Date   HGBA1C 6.1 04/12/2024   HGBA1C 6.0 10/12/2023   HGBA1C 6.2 04/13/2023   Lab Results  Component Value Date   LDLCALC 98 04/12/2024    CREATININE 0.93 04/12/2024   INSULIN   Date Value Ref Range Status  12/24/2021 10.8 2.6 - 24.9 uIU/mL Final  ]Continue working on nutrition plan to decrease simple carbohydrates, increase lean proteins and exercise to promote weight loss, improve glycemic control and prevent progression to Type 2 diabetes.  - Continue topiramate  25 mg daily.- Does not need refill today.  - Encouraged adherence to nutrition plan and portion control.  Essential hypertension Blood pressure was elevated at 158/80 during an episode of chest pain.  BP Readings from Last 3 Encounters:  04/26/24 134/74  04/18/24 104/64  04/12/24 130/68   Lab Results  Component Value Date   NA 141 04/12/2024   CL 102 04/12/2024   K 3.6 04/12/2024   CO2 30 04/12/2024   BUN 18 04/12/2024   CREATININE 0.93 04/12/2024   GFR 60.77 04/12/2024   CALCIUM  9.2 04/12/2024   ALBUMIN 4.2 04/12/2024   GLUCOSE 88 04/12/2024  Renal function has improved. She is doing better overall with fluid intake.  Current management includes valsartan /hydrochlorothiazide . Continue to work on nutrition plan to promote weight loss and improve BP control.  - Continue valsartan /hydrochlorothiazide  80/12.5 mg daily.  Hyperlipidemia Cholesterol levels have improved with current management. Total cholesterol decreased from 203 to 174, LDL decreased from 119 to 98, and HDL decreased slightly but remains high. Triglycerides are within normal range. Lab Results  Component Value Date   CHOL 174 04/12/2024   CHOL 203 (H) 10/12/2023   CHOL 206 (H) 04/13/2023   Lab Results  Component Value Date   HDL 60.30 04/12/2024   HDL 71.60 10/12/2023   HDL 62.80 04/13/2023   Lab Results  Component Value Date   LDLCALC 98 04/12/2024   LDLCALC 119 (H) 10/12/2023   LDLCALC 128 (H) 04/13/2023   Lab Results  Component Value Date   TRIG 79.0 04/12/2024   TRIG 62.0 10/12/2023   TRIG 76.0 04/13/2023   Lab Results  Component Value Date   CHOLHDL 3 04/12/2024    CHOLHDL 3 10/12/2023   CHOLHDL 3 04/13/2023   No results found for: LDLDIRECT Continue to work on nutrition plan -decreasing simple carbohydrates, increasing lean proteins, decreasing saturated fats and cholesterol , avoiding trans fats and exercise as able to promote weight loss, improve lipids and decrease cardiovascular risks. - Continue rosuvastatin  5 mg daily per PCP.  Depression with emotional eating Current management includes bupropion  for depression and emotional eating. No SE. Feels is helpful to curb emotional eating.  - Refilled bupropion  150 mg daily. Meds ordered this encounter  Medications   buPROPion  (WELLBUTRIN  XL) 150 MG 24 hr tablet    Sig: Take 1 tablet (150 mg total) by mouth daily.    Dispense:  30 tablet    Refill:  1    Chest pain, under evaluation Intermittent chest pain with episodes of feeling faint x 1 episode. Recent EKG was normal at visit with PCP. Awaiting coronary calcium  score from recent CT scan to assess for potential blockages. - Await coronary calcium  score results. - Advised to seek emergency care/dial 911 if chest pain becomes persistent or severe or other concerning episodes like  feeling faint or weak.   Vitals Temp: 98.1 F (36.7 C) BP: 134/74 Pulse Rate: 62 SpO2: 97 %   Anthropometric Measurements Height: 5' 1 (1.549 m) Weight: 202 lb (91.6 kg) BMI (Calculated): 38.19 Weight at Last Visit: 198 lb Weight Lost Since Last Visit: 0 Weight Gained Since Last Visit: 4 lb Starting Weight: 223 lb Total Weight Loss (lbs): 21 lb (9.526 kg)   Body Composition  Body Fat %: 48.2 % Fat Mass (lbs): 97.4 lbs Muscle Mass (lbs): 99.2 lbs Total Body Water (lbs): 70.2 lbs Visceral Fat Rating : 16   Other Clinical Data Fasting: No Labs: No Today's Visit #: 32 Starting Date: 12/24/21     ASSESSMENT AND PLAN:  Diet: Olivia Burns is currently in the action stage of change. As such, her goal is to continue with weight loss efforts. She  has agreed to Category 2 Plan.  Exercise: Olivia Burns has been instructed exercise when able /knee pain allows for weight loss and overall health benefits.   Behavior Modification:  We discussed the following Behavioral Modification Strategies today: increasing lean protein intake, decreasing simple carbohydrates, increasing vegetables, increase H2O intake, increase high fiber foods, meal planning and cooking strategies, emotional eating strategies , holiday eating strategies, avoiding temptations, and planning for success. We discussed various medication options to help Olivia Burns with her weight loss efforts and we both agreed to continue bupropion  for emotional eating and topiramate  for cravings. .  Return in about 4 weeks (around 05/24/2024).Olivia Burns She was informed of the importance of frequent follow up visits to maximize her success with intensive lifestyle modifications for her multiple health conditions.  Attestation Statements:   Reviewed by clinician on day of visit: allergies, medications, problem list, medical history, surgical history, family history, social history, and previous encounter notes.   Time spent on visit including pre-visit chart review and post-visit care and charting was 31 minutes.    Terresa Marlett, PA-C

## 2024-04-26 ENCOUNTER — Encounter (HOSPITAL_COMMUNITY): Payer: Self-pay

## 2024-04-26 ENCOUNTER — Ambulatory Visit (INDEPENDENT_AMBULATORY_CARE_PROVIDER_SITE_OTHER): Payer: Self-pay | Admitting: Physician Assistant

## 2024-04-26 ENCOUNTER — Encounter (INDEPENDENT_AMBULATORY_CARE_PROVIDER_SITE_OTHER): Payer: Self-pay | Admitting: Physician Assistant

## 2024-04-26 VITALS — BP 134/74 | HR 62 | Temp 98.1°F | Ht 61.0 in | Wt 202.0 lb

## 2024-04-26 DIAGNOSIS — E669 Obesity, unspecified: Secondary | ICD-10-CM | POA: Diagnosis not present

## 2024-04-26 DIAGNOSIS — F32A Depression, unspecified: Secondary | ICD-10-CM | POA: Diagnosis not present

## 2024-04-26 DIAGNOSIS — M1711 Unilateral primary osteoarthritis, right knee: Secondary | ICD-10-CM

## 2024-04-26 DIAGNOSIS — R079 Chest pain, unspecified: Secondary | ICD-10-CM

## 2024-04-26 DIAGNOSIS — F5089 Other specified eating disorder: Secondary | ICD-10-CM

## 2024-04-26 DIAGNOSIS — F3289 Other specified depressive episodes: Secondary | ICD-10-CM

## 2024-04-26 DIAGNOSIS — E785 Hyperlipidemia, unspecified: Secondary | ICD-10-CM

## 2024-04-26 DIAGNOSIS — N1831 Chronic kidney disease, stage 3a: Secondary | ICD-10-CM

## 2024-04-26 DIAGNOSIS — Z6838 Body mass index (BMI) 38.0-38.9, adult: Secondary | ICD-10-CM | POA: Diagnosis not present

## 2024-04-26 DIAGNOSIS — I10 Essential (primary) hypertension: Secondary | ICD-10-CM | POA: Diagnosis not present

## 2024-04-26 DIAGNOSIS — R7303 Prediabetes: Secondary | ICD-10-CM | POA: Diagnosis not present

## 2024-04-26 MED ORDER — BUPROPION HCL ER (XL) 150 MG PO TB24: 150.0000 mg | ORAL_TABLET | Freq: Every day | ORAL | 1 refills | Status: DC

## 2024-05-02 ENCOUNTER — Ambulatory Visit (HOSPITAL_COMMUNITY)
Admission: RE | Admit: 2024-05-02 | Discharge: 2024-05-02 | Disposition: A | Discharge status: Home / Self Care | Source: Ambulatory Visit | Attending: Cardiovascular Disease | Admitting: Cardiovascular Disease

## 2024-05-02 DIAGNOSIS — R072 Precordial pain: Secondary | ICD-10-CM | POA: Insufficient documentation

## 2024-05-02 MED ORDER — NITROGLYCERIN 0.4 MG SL SUBL
0.8000 mg | SUBLINGUAL_TABLET | Freq: Once | SUBLINGUAL | Status: AC
  Administered 2024-05-02: 0.8 mg via SUBLINGUAL

## 2024-05-02 MED ORDER — IOHEXOL 350 MG/ML SOLN
100.0000 mL | Freq: Once | INTRAVENOUS | Status: AC | PRN
  Administered 2024-05-02: 100 mL via INTRAVENOUS

## 2024-05-03 ENCOUNTER — Ambulatory Visit: Payer: Self-pay | Admitting: Cardiovascular Disease

## 2024-05-04 DIAGNOSIS — F330 Major depressive disorder, recurrent, mild: Secondary | ICD-10-CM | POA: Diagnosis not present

## 2024-05-07 LAB — GENECONNECT MOLECULAR SCREEN: Genetic Analysis Overall Interpretation: NEGATIVE

## 2024-05-17 DIAGNOSIS — F411 Generalized anxiety disorder: Secondary | ICD-10-CM | POA: Diagnosis not present

## 2024-05-17 DIAGNOSIS — F5081 Binge eating disorder, mild: Secondary | ICD-10-CM | POA: Diagnosis not present

## 2024-05-17 DIAGNOSIS — F331 Major depressive disorder, recurrent, moderate: Secondary | ICD-10-CM | POA: Diagnosis not present

## 2024-05-17 DIAGNOSIS — Z5181 Encounter for therapeutic drug level monitoring: Secondary | ICD-10-CM | POA: Diagnosis not present

## 2024-05-23 ENCOUNTER — Ambulatory Visit (INDEPENDENT_AMBULATORY_CARE_PROVIDER_SITE_OTHER): Payer: Self-pay | Admitting: Physician Assistant

## 2024-05-23 ENCOUNTER — Encounter (INDEPENDENT_AMBULATORY_CARE_PROVIDER_SITE_OTHER): Payer: Self-pay | Admitting: Physician Assistant

## 2024-05-23 VITALS — BP 114/73 | HR 72 | Temp 97.8°F | Ht 61.0 in | Wt 196.0 lb

## 2024-05-23 DIAGNOSIS — R7303 Prediabetes: Secondary | ICD-10-CM

## 2024-05-23 DIAGNOSIS — F5089 Other specified eating disorder: Secondary | ICD-10-CM

## 2024-05-23 DIAGNOSIS — J986 Disorders of diaphragm: Secondary | ICD-10-CM | POA: Diagnosis not present

## 2024-05-23 DIAGNOSIS — Z6837 Body mass index (BMI) 37.0-37.9, adult: Secondary | ICD-10-CM | POA: Diagnosis not present

## 2024-05-23 DIAGNOSIS — E669 Obesity, unspecified: Secondary | ICD-10-CM

## 2024-05-23 DIAGNOSIS — R079 Chest pain, unspecified: Secondary | ICD-10-CM | POA: Diagnosis not present

## 2024-05-23 DIAGNOSIS — F3289 Other specified depressive episodes: Secondary | ICD-10-CM | POA: Diagnosis not present

## 2024-05-23 MED ORDER — BUPROPION HCL ER (XL) 150 MG PO TB24: 150.0000 mg | ORAL_TABLET | Freq: Every day | ORAL | 1 refills | Status: AC

## 2024-05-23 MED ORDER — TOPIRAMATE 25 MG PO TABS: 25.0000 mg | ORAL_TABLET | Freq: Every day | ORAL | 1 refills | Status: AC

## 2024-05-23 NOTE — Progress Notes (Signed)
 "  SUBJECTIVE: Discussed the use of AI scribe software for clinical note transcription with the patient, who gave verbal consent to proceed.  Chief Complaint: Obesity  Interim History: She is down 6 lbs since her last visit.  Down 27 lbs overall.   Olivia Burns is here to discuss her progress with her obesity treatment plan. She is on the Category 2 Plan and states she is following her eating plan approximately 50 % of the time. She states she is exercising walking 40 minutes 7 times per week. Olivia Burns is a 73 year old female who presents for follow-up of her obesity treatment plan.  She has a history of prediabetes, hyperlipidemia, and emotional eating. Her current medications include bupropion  150 mg daily for depression with emotional eating, topiramate  25 mg once daily for cravings related to her prediabetic state, valsartan  hydrochlorothiazide  80/12.5 mg daily for blood pressure, and rosuvastatin  5 mg daily for hyperlipidemia. She is also on Latuda and Zoloft for depressive symptoms.  She has achieved a total weight loss of 27 pounds, with a recent loss of 6 pounds since her last visit. She follows a category two plan approximately 50% of the time but is not consistently eating whole foods, getting the recommended amount of protein, or drinking enough water. She skips some meals and sometimes lacks the appetite to prepare food, opting for salads and fruit instead. She occasionally uses microwave meals and Progresso canned soup with high protein options.  She sleeps 7 to 9 hours a night and walks for 40 minutes, seven days per week. She received two shots in her knees last week.  She has a history of exertional chest pain and underwent a coronary calcium  scan, which showed a score of zero; the scan also noted aortic atherosclerosis. A chest CT revealed moderate elevation of the right hemidiaphragm and right middle and lower lobe scarring or atelectasis.  She recalls an injury in her late  fifties or early sixties that may have caused the chronic elevation of her right hemidiaphragm, which has been painful and sometimes causes shortness of breath. She was previously given exercises and prednisone for chest pain, which resolved, but the elevation persisted. She has been told she has a hernia, which might be related to the diaphragm issue.  OBJECTIVE: Visit Diagnoses: Problem List Items Addressed This Visit     Prediabetes   Relevant Medications   topiramate  (TOPAMAX ) 25 MG tablet   Depression   Relevant Medications   buPROPion  (WELLBUTRIN  XL) 150 MG 24 hr tablet   Obesity with starting BMI of 42.1   BMI 37.0-37.9, adult   Other Visit Diagnoses       Exertional chest pain- Coronary Ca++ score pending for 05/02/24    -  Primary     Elevated diaphragm          Obesity  Continued weight loss with a total of 27 pounds lost. Following a category two plan approximately 50% of the time. Not consistently eating whole foods, getting recommended protein, or drinking enough water. Skipping meals and experiencing decreased appetite. Walking 40 minutes daily, 7 days a week. Recent weight loss of 6 pounds since last visit. - Encouraged adherence to category two plan. - Discussed meal prep options for convenience and protein intake. - Continue walking regimen.  Work up for non exertional chest pain Saw Dr. Barbaraann for cardiology work up .  Radiology Coronary calcium  CT: Coronary calcium  score of zero; normal coronary arteries; aortic atherosclerosis (Independently interpreted) Chest CT:  Moderate right hemidiaphragm elevation; right middle and lower lobe scarring and/or atelectasis (Independently interpreted) Plan:  Plans follow up with PCP concerning elevation of right hemidiaphragm.   Prediabetes with cravings.  Managed with topiramate  25 mg daily for cravings. Reports some fatigue after taking topiramate , but it helps with cravings. Lab Results  Component Value Date   HGBA1C 6.1  04/12/2024   HGBA1C 6.0 10/12/2023   HGBA1C 6.2 04/13/2023   Lab Results  Component Value Date   LDLCALC 98 04/12/2024   CREATININE 0.93 04/12/2024  Plan: Continue working on nutrition plan to decrease simple carbohydrates, increase lean proteins and exercise to promote weight loss, improve glycemic control and prevent progression to Type 2 diabetes.  - Continue/refill topiramate  25 mg daily. Meds ordered this encounter  Medications   buPROPion  (WELLBUTRIN  XL) 150 MG 24 hr tablet    Sig: Take 1 tablet (150 mg total) by mouth daily.    Dispense:  30 tablet    Refill:  1   topiramate  (TOPAMAX ) 25 MG tablet    Sig: Take 1 tablet (25 mg total) by mouth daily at 8 pm.    Dispense:  30 tablet    Refill:  1     Depression with emotional eating Managed with bupropion  XL 150 mg daily and behavioral health support with Latuda and Zoloft. Reports occasional cravings but finds topiramate  helpful. - Continue/refill bupropion  XL150 mg daily. - Continue Latuda and Zoloft as prescribed. Meds ordered this encounter  Medications   buPROPion  (WELLBUTRIN  XL) 150 MG 24 hr tablet    Sig: Take 1 tablet (150 mg total) by mouth daily.    Dispense:  30 tablet    Refill:  1   topiramate  (TOPAMAX ) 25 MG tablet    Sig: Take 1 tablet (25 mg total) by mouth daily at 8 pm.    Dispense:  30 tablet    Refill:  1    Right hemidiaphragm elevation with ?right lung scarring/atelectasis ? Chronic elevation of the right hemidiaphragm with moderate severity noted on chest CT.  Right middle lobe and lower lobe scarring or atelectasis present. Possible trauma-related etiology. Reports chronic pain and occasional shortness of breath for many years. Differential includes trauma, liver issues, or diaphragmatic hernia. - Follow up with primary care provider for further evaluation of right hemidiaphragm elevation. Vitals Temp: 97.8 F (36.6 C) BP: 114/73 Pulse Rate: 72 SpO2: 97 %   Anthropometric  Measurements Height: 5' 1 (1.549 m) Weight: 196 lb (88.9 kg) BMI (Calculated): 37.05 Weight at Last Visit: 202 lb Weight Lost Since Last Visit: 6 lb Weight Gained Since Last Visit: 0 Starting Weight: 223 lb Total Weight Loss (lbs): 27 lb (12.2 kg)   Body Composition  Body Fat %: 47.4 % Fat Mass (lbs): 93 lbs Muscle Mass (lbs): 98.2 lbs Total Body Water (lbs): 66.6 lbs Visceral Fat Rating : 16   Other Clinical Data Fasting: Yes Labs: No Today's Visit #: 97 Starting Date: 12/24/21     ASSESSMENT AND PLAN:  Diet: Olivia Burns is currently in the action stage of change. As such, her goal is to continue with weight loss efforts. She has agreed to Category 2 Plan.  Exercise: Olivia Burns has been instructed to work up to a goal of 150 minutes of combined cardio and strengthening exercise per week for weight loss and overall health benefits.   Behavior Modification:  We discussed the following Behavioral Modification Strategies today: increasing lean protein intake, decreasing simple carbohydrates, increasing vegetables, increase H2O intake, increase  high fiber foods, no skipping meals, meal planning and cooking strategies, emotional eating strategies , holiday eating strategies, avoiding temptations, and planning for success. We discussed various medication options to help Olivia Burns with her weight loss efforts and we both agreed to continue bupropion  and topiramate  for emotional eating/cravings.  Return in about 4 weeks (around 06/20/2024).Olivia Burns She was informed of the importance of frequent follow up visits to maximize her success with intensive lifestyle modifications for her multiple health conditions.  Attestation Statements:   Reviewed by clinician on day of visit: allergies, medications, problem list, medical history, surgical history, family history, social history, and previous encounter notes.   Time spent on visit including pre-visit chart review and post-visit care and charting was  32 minutes.    Loyalty Arentz, PA-C  "

## 2024-06-15 ENCOUNTER — Other Ambulatory Visit (INDEPENDENT_AMBULATORY_CARE_PROVIDER_SITE_OTHER): Payer: Self-pay | Admitting: Physician Assistant

## 2024-06-15 DIAGNOSIS — F3289 Other specified depressive episodes: Secondary | ICD-10-CM

## 2024-06-20 ENCOUNTER — Encounter (INDEPENDENT_AMBULATORY_CARE_PROVIDER_SITE_OTHER): Payer: Self-pay | Admitting: Physician Assistant

## 2024-06-20 ENCOUNTER — Ambulatory Visit (INDEPENDENT_AMBULATORY_CARE_PROVIDER_SITE_OTHER): Admitting: Physician Assistant

## 2024-06-20 VITALS — BP 115/68 | HR 74 | Temp 98.1°F | Ht 61.0 in | Wt 200.0 lb

## 2024-06-20 DIAGNOSIS — E785 Hyperlipidemia, unspecified: Secondary | ICD-10-CM | POA: Diagnosis not present

## 2024-06-20 DIAGNOSIS — R7303 Prediabetes: Secondary | ICD-10-CM | POA: Diagnosis not present

## 2024-06-20 DIAGNOSIS — Z6837 Body mass index (BMI) 37.0-37.9, adult: Secondary | ICD-10-CM | POA: Diagnosis not present

## 2024-06-20 DIAGNOSIS — E669 Obesity, unspecified: Secondary | ICD-10-CM | POA: Diagnosis not present

## 2024-06-20 DIAGNOSIS — K5900 Constipation, unspecified: Secondary | ICD-10-CM | POA: Diagnosis not present

## 2024-06-20 DIAGNOSIS — F3289 Other specified depressive episodes: Secondary | ICD-10-CM | POA: Diagnosis not present

## 2024-06-20 DIAGNOSIS — F5089 Other specified eating disorder: Secondary | ICD-10-CM

## 2024-06-20 DIAGNOSIS — M170 Bilateral primary osteoarthritis of knee: Secondary | ICD-10-CM

## 2024-06-20 DIAGNOSIS — I10 Essential (primary) hypertension: Secondary | ICD-10-CM | POA: Diagnosis not present

## 2024-06-20 NOTE — Progress Notes (Signed)
 "  SUBJECTIVE: Discussed the use of AI scribe software for clinical note transcription with the patient, who gave verbal consent to proceed.  Chief Complaint: Obesity  Interim History: She is up 4 lbs since her last visit. Down 23 lbs overall TBW loss of 10.3 %  Bio impedence scale reviewed: Muscle mass + 2.8 lbs Adipose mass + 1.4 lbs   Olivia Burns is here to discuss her progress with her obesity treatment plan. She is on the Category 2 Plan and states she is following her eating plan approximately 75 % of the time. She states she is exercising walking her dog- Olivia Burns 40 minutes 7 times per week.  Olivia Burns is a 74 year old female who presents for follow-up of her obesity treatment plan.  She is experiencing ongoing constipation despite trying Ducolax chews without success and occasionally using [employer] Clearlax . Milk of magnesia provides some relief. She consumes only one and a half glasses of water per day, which may contribute to her symptoms.  She has gained four pounds since her last visit, but most of this is due to muscle gain by nearly 3 lbs over this past month as she has been more active. She has increased her physical activity by walking her dog for forty minutes daily.  She feels she was able to be more active as the steroid injections in December to her knees helped significantly.  She does not skip meals and sleeps at least seven hours per night.  Her current medications include bupropion  150 mg daily, topiramate  25 mg at bedtime, sertraline 50 mg daily, valsartan /hydrochlorothiazide  80/12.5 mg daily, rosuvastatin  5 mg daily, and ferrous gluconate  once daily. She reports no issues with her medications but notes [facility] preference for 90-day prescriptions, which does not align with her current schedule.  She received injections in both knees in December for osteoarthritis and plans to discuss knee replacement surgery next month. Her psychologist suggests considering a rehab  facility post-surgery due to potential issues with her initial caregiver plan.  OBJECTIVE: Visit Diagnoses: Problem List Items Addressed This Visit     Essential hypertension   Prediabetes - Primary   Hyperlipidemia   Depression   Obesity with starting BMI of 42.1   BMI 37.0-37.9, adult   Constipation   Other Visit Diagnoses       Primary osteoarthritis of both knees         Obesity Weight gain of 4 pounds since last visit, but is primarily due to muscle mass increase.  Muscle mass increased from 98.2 pounds to 101 pounds.  Adipose mass increased by 1.4 pounds. Total body water increased by 1.6 pounds.  Engaging in regular physical activity by walking her dog for 40 minutes daily. Consuming recommended protein intake but struggles with adequate water intake. No meal skipping reported. Sleeping at least 7 hours per night. - Continue current exercise regimen, including walking her dog. - Encouraged increased water intake to at least 64 ounces daily. - Consider using a timed water bottle to aid in hydration. - Continue current medication regimen, including bupropion  and topiramate .  Constipation Ongoing constipation despite use of Ducolax chews and occasional use of Clearlax. Inconsistent use of Miralax/clearlax. Reports inadequate water intake, which may contribute to constipation. Consuming some fresh fruits and vegetables, which may aid in fiber intake. - Start Clearlax at half a capful daily for one week, then increase to a full capful daily if needed. - Increase water intake to at least 64 ounces daily. -  Consider using a timed water bottle to aid in hydration. - Continue consuming fresh fruits and vegetables for fiber intake.  Primary osteoarthritis of both knees Received knee injections in December. She is scheduled for follow-up next month to discuss potential knee replacement surgery. BMI should be in line her for surgery. Active and maintaining strength, which is  beneficial for surgical candidacy. Considering rehabilitation facility post-surgery due to lack of family support. - Continue current activity level to maintain strength. - She is considering rehabilitation facility post-surgery for recovery support if she can find help with her dog- Olivia Burns for coverage after surgery.   Essential hypertension Currently managed with valsartan  80 mg-hydrochlorothiazide  12.5 mg daily. No reported SE.  BP Readings from Last 3 Encounters:  06/20/24 115/68  05/23/24 114/73  05/02/24 137/82  Continue to work on nutrition plan to promote weight loss and improve BP control.  - Continue current antihypertensive regimen.  Other Depression/emotional eating Managed with sertraline and bupropion . Reports improved sleep with topiramate  at bedtime. Emotional eating behaviors noted- moderately controlled in current medications.  - Continue current antidepressant regimen, including sertraline and bupropion . - Continue topiramate  at bedtime for sleep improvement. No refills for bupropion  or topiramate  this visit. - Will need 90 day supply next refill.   Prediabetes Last A1c was 6.1/Insulin  10.8- not at goals  Medication(s): Topiramate  25 mg nightly   Reports no SE.  Polyphagia:No Lab Results  Component Value Date   HGBA1C 6.1 04/12/2024   HGBA1C 6.0 10/12/2023   HGBA1C 6.2 04/13/2023   HGBA1C 6.3 10/10/2022   HGBA1C 6.1 (H) 12/24/2021   Lab Results  Component Value Date   INSULIN  10.8 12/24/2021    Plan: Continue Topiramate  25 mg nightly Continue working on nutrition plan to decrease simple carbohydrates, increase lean proteins and exercise to promote weight loss, improve glycemic control and prevent progression to Type 2 diabetes.    Hyperlipidemia Managed with rosuvastatin  5 mg daily Lab Results  Component Value Date   CHOL 174 04/12/2024   CHOL 203 (H) 10/12/2023   CHOL 206 (H) 04/13/2023   Lab Results  Component Value Date   HDL 60.30 04/12/2024    HDL 71.60 10/12/2023   HDL 62.80 04/13/2023   Lab Results  Component Value Date   LDLCALC 98 04/12/2024   LDLCALC 119 (H) 10/12/2023   LDLCALC 128 (H) 04/13/2023   Lab Results  Component Value Date   TRIG 79.0 04/12/2024   TRIG 62.0 10/12/2023   TRIG 76.0 04/13/2023   Lab Results  Component Value Date   CHOLHDL 3 04/12/2024   CHOLHDL 3 10/12/2023   CHOLHDL 3 04/13/2023   No results found for: LDLDIRECT Improved LDL, HDL slightly lower, but more active over past month. Trig- at goal. - Continue current statin therapy with rosuvastatin . Continue to work on nutrition plan -decreasing simple carbohydrates, increasing lean proteins, decreasing saturated fats and cholesterol , avoiding trans fats and exercise as able to promote weight loss, improve lipids and decrease cardiovascular risks.  Vitals Temp: 98.1 F (36.7 C) BP: 115/68 Pulse Rate: 74 SpO2: 95 %   Anthropometric Measurements Height: 5' 1 (1.549 m) Weight: 200 lb (90.7 kg) BMI (Calculated): 37.81 Weight at Last Visit: 196 lb Weight Lost Since Last Visit: 0 Weight Gained Since Last Visit: 4 lb Starting Weight: 223 lb Total Weight Loss (lbs): 23 lb (10.4 kg)   Body Composition  Body Fat %: 47 % Fat Mass (lbs): 94.4 lbs Muscle Mass (lbs): 101 lbs Total Body Water (lbs):  68.2 lbs Visceral Fat Rating : 16   Other Clinical Data Fasting: Yes Labs: No Today's Visit #: 34 Starting Date: 12/24/21     ASSESSMENT AND PLAN:  Diet: Olivia Burns is currently in the action stage of change. As such, her goal is to continue with weight loss efforts. She has agreed to Category 2 Plan.  Exercise: Olivia Burns has been instructed to work up to a goal of 150 minutes of combined cardio and strengthening exercise per week for weight loss and overall health benefits.   Behavior Modification:  We discussed the following Behavioral Modification Strategies today: increasing lean protein intake, decreasing simple  carbohydrates, increasing vegetables, increase H2O intake, increase high fiber foods, meal planning and cooking strategies, better snacking choices, emotional eating strategies , avoiding temptations, and planning for success. We discussed various medication options to help Olivia Burns with her weight loss efforts and we both agreed to continue bupropion  and topiramate  for cravings/emotional eating.  Return in about 4 weeks (around 07/18/2024).Olivia Burns She was informed of the importance of frequent follow up visits to maximize her success with intensive lifestyle modifications for her multiple health conditions.  Attestation Statements:   Reviewed by clinician on day of visit: allergies, medications, problem list, medical history, surgical history, family history, social history, and previous encounter notes.   Time spent on visit including pre-visit chart review and post-visit care and charting was 36 minutes.    Olivia Slovacek, PA-C  "

## 2024-07-12 ENCOUNTER — Ambulatory Visit: Payer: Medicare Other

## 2024-07-18 ENCOUNTER — Ambulatory Visit (INDEPENDENT_AMBULATORY_CARE_PROVIDER_SITE_OTHER): Admitting: Physician Assistant

## 2024-08-15 ENCOUNTER — Ambulatory Visit (INDEPENDENT_AMBULATORY_CARE_PROVIDER_SITE_OTHER): Admitting: Physician Assistant

## 2024-08-16 ENCOUNTER — Ambulatory Visit (INDEPENDENT_AMBULATORY_CARE_PROVIDER_SITE_OTHER): Admitting: Physician Assistant

## 2024-10-10 ENCOUNTER — Ambulatory Visit: Admitting: Family Medicine
